# Patient Record
Sex: Female | Born: 1965 | Race: White | Hispanic: No | Marital: Married | Smoking: Former smoker
Health system: Southern US, Community
[De-identification: ages and names within clinical notes are randomized; demographics above are authoritative.]

## PROBLEM LIST (undated history)

## (undated) DIAGNOSIS — E039 Hypothyroidism, unspecified: Secondary | ICD-10-CM

## (undated) DIAGNOSIS — Z9889 Other specified postprocedural states: Secondary | ICD-10-CM

## (undated) DIAGNOSIS — I10 Essential (primary) hypertension: Secondary | ICD-10-CM

## (undated) DIAGNOSIS — R609 Edema, unspecified: Secondary | ICD-10-CM

## (undated) DIAGNOSIS — R112 Nausea with vomiting, unspecified: Secondary | ICD-10-CM

## (undated) DIAGNOSIS — Z973 Presence of spectacles and contact lenses: Secondary | ICD-10-CM

## (undated) DIAGNOSIS — K760 Fatty (change of) liver, not elsewhere classified: Secondary | ICD-10-CM

## (undated) DIAGNOSIS — K219 Gastro-esophageal reflux disease without esophagitis: Secondary | ICD-10-CM

## (undated) DIAGNOSIS — R011 Cardiac murmur, unspecified: Secondary | ICD-10-CM

## (undated) DIAGNOSIS — K651 Peritoneal abscess: Secondary | ICD-10-CM

## (undated) HISTORY — DX: Fatty (change of) liver, not elsewhere classified: K76.0

## (undated) HISTORY — PX: TONSILLECTOMY: SUR1361

## (undated) HISTORY — PX: WISDOM TOOTH EXTRACTION: SHX21

## (undated) HISTORY — PX: APPENDECTOMY: SHX54

## (undated) HISTORY — PX: OTHER SURGICAL HISTORY: SHX169

## (undated) HISTORY — DX: Cardiac murmur, unspecified: R01.1

## (undated) HISTORY — PX: CHOLECYSTECTOMY: SHX55

## (undated) HISTORY — PX: ADENOIDECTOMY: SUR15

## (undated) HISTORY — PX: ABDOMINAL HYSTERECTOMY: SHX81

---

## 1999-06-23 ENCOUNTER — Other Ambulatory Visit: Admission: RE | Admit: 1999-06-23 | Discharge: 1999-06-23 | Payer: Self-pay | Admitting: Obstetrics and Gynecology

## 2000-06-15 ENCOUNTER — Other Ambulatory Visit: Admission: RE | Admit: 2000-06-15 | Discharge: 2000-06-15 | Payer: Self-pay | Admitting: Obstetrics and Gynecology

## 2001-07-17 ENCOUNTER — Other Ambulatory Visit: Admission: RE | Admit: 2001-07-17 | Discharge: 2001-07-17 | Payer: Self-pay | Admitting: Obstetrics and Gynecology

## 2001-07-24 ENCOUNTER — Ambulatory Visit (HOSPITAL_COMMUNITY): Admission: RE | Admit: 2001-07-24 | Discharge: 2001-07-24 | Payer: Self-pay | Admitting: Obstetrics and Gynecology

## 2001-07-24 ENCOUNTER — Encounter: Payer: Self-pay | Admitting: Obstetrics and Gynecology

## 2002-03-30 ENCOUNTER — Encounter: Payer: Self-pay | Admitting: Obstetrics & Gynecology

## 2002-03-30 ENCOUNTER — Inpatient Hospital Stay (HOSPITAL_COMMUNITY): Admission: AD | Admit: 2002-03-30 | Discharge: 2002-03-30 | Payer: Self-pay | Admitting: Obstetrics & Gynecology

## 2002-05-01 ENCOUNTER — Inpatient Hospital Stay (HOSPITAL_COMMUNITY): Admission: AD | Admit: 2002-05-01 | Discharge: 2002-05-01 | Payer: Self-pay | Admitting: Obstetrics & Gynecology

## 2002-05-03 ENCOUNTER — Inpatient Hospital Stay (HOSPITAL_COMMUNITY): Admission: AD | Admit: 2002-05-03 | Discharge: 2002-05-06 | Payer: Self-pay | Admitting: Obstetrics & Gynecology

## 2002-05-03 ENCOUNTER — Encounter (INDEPENDENT_AMBULATORY_CARE_PROVIDER_SITE_OTHER): Payer: Self-pay

## 2002-05-07 ENCOUNTER — Encounter: Admission: RE | Admit: 2002-05-07 | Discharge: 2002-06-06 | Payer: Self-pay | Admitting: Obstetrics & Gynecology

## 2002-06-06 ENCOUNTER — Other Ambulatory Visit: Admission: RE | Admit: 2002-06-06 | Discharge: 2002-06-06 | Payer: Self-pay | Admitting: Obstetrics & Gynecology

## 2002-06-07 ENCOUNTER — Encounter: Admission: RE | Admit: 2002-06-07 | Discharge: 2002-07-07 | Payer: Self-pay | Admitting: Obstetrics & Gynecology

## 2003-06-12 ENCOUNTER — Emergency Department (HOSPITAL_COMMUNITY): Admission: EM | Admit: 2003-06-12 | Discharge: 2003-06-13 | Payer: Self-pay | Admitting: Emergency Medicine

## 2003-07-26 ENCOUNTER — Other Ambulatory Visit: Admission: RE | Admit: 2003-07-26 | Discharge: 2003-07-26 | Payer: Self-pay | Admitting: Obstetrics & Gynecology

## 2004-10-27 ENCOUNTER — Ambulatory Visit: Payer: Self-pay | Admitting: Family Medicine

## 2004-12-10 ENCOUNTER — Ambulatory Visit (HOSPITAL_COMMUNITY): Admission: RE | Admit: 2004-12-10 | Discharge: 2004-12-10 | Payer: Self-pay | Admitting: Surgery

## 2004-12-10 ENCOUNTER — Encounter (INDEPENDENT_AMBULATORY_CARE_PROVIDER_SITE_OTHER): Payer: Self-pay | Admitting: Specialist

## 2004-12-24 ENCOUNTER — Ambulatory Visit: Payer: Self-pay | Admitting: Family Medicine

## 2005-06-14 ENCOUNTER — Ambulatory Visit: Payer: Self-pay | Admitting: Family Medicine

## 2005-11-25 ENCOUNTER — Ambulatory Visit: Payer: Self-pay | Admitting: Family Medicine

## 2005-11-29 DIAGNOSIS — E039 Hypothyroidism, unspecified: Secondary | ICD-10-CM | POA: Insufficient documentation

## 2006-02-21 ENCOUNTER — Ambulatory Visit: Payer: Self-pay | Admitting: Family Medicine

## 2006-02-21 DIAGNOSIS — R1314 Dysphagia, pharyngoesophageal phase: Secondary | ICD-10-CM | POA: Insufficient documentation

## 2006-03-07 ENCOUNTER — Telehealth (INDEPENDENT_AMBULATORY_CARE_PROVIDER_SITE_OTHER): Payer: Self-pay | Admitting: *Deleted

## 2006-03-09 ENCOUNTER — Telehealth: Payer: Self-pay | Admitting: Family Medicine

## 2006-05-30 ENCOUNTER — Ambulatory Visit: Payer: Self-pay | Admitting: Family Medicine

## 2006-06-06 ENCOUNTER — Telehealth: Payer: Self-pay | Admitting: Family Medicine

## 2006-06-08 ENCOUNTER — Ambulatory Visit: Payer: Self-pay | Admitting: Family Medicine

## 2006-07-04 ENCOUNTER — Telehealth: Payer: Self-pay | Admitting: Family Medicine

## 2006-12-13 ENCOUNTER — Encounter: Payer: Self-pay | Admitting: Family Medicine

## 2007-08-28 ENCOUNTER — Ambulatory Visit: Payer: Self-pay | Admitting: Family Medicine

## 2007-08-28 DIAGNOSIS — E785 Hyperlipidemia, unspecified: Secondary | ICD-10-CM | POA: Insufficient documentation

## 2007-08-28 LAB — CONVERTED CEMR LAB
Cholesterol, target level: 200 mg/dL
LDL Goal: 160 mg/dL

## 2007-08-29 ENCOUNTER — Encounter: Admission: RE | Admit: 2007-08-29 | Discharge: 2007-08-29 | Payer: Self-pay | Admitting: Family Medicine

## 2007-08-29 ENCOUNTER — Encounter: Payer: Self-pay | Admitting: Family Medicine

## 2007-08-30 LAB — CONVERTED CEMR LAB
Cholesterol: 187 mg/dL (ref 0–200)
LDL Cholesterol: 100 mg/dL — ABNORMAL HIGH (ref 0–99)
Total CHOL/HDL Ratio: 4
Triglycerides: 200 mg/dL — ABNORMAL HIGH (ref <150)
VLDL: 40 mg/dL (ref 0–40)

## 2007-08-31 ENCOUNTER — Encounter: Admission: RE | Admit: 2007-08-31 | Discharge: 2007-08-31 | Payer: Self-pay | Admitting: Family Medicine

## 2008-01-02 ENCOUNTER — Encounter: Payer: Self-pay | Admitting: Family Medicine

## 2008-01-05 ENCOUNTER — Encounter: Payer: Self-pay | Admitting: Family Medicine

## 2008-01-08 LAB — CONVERTED CEMR LAB
LDL Cholesterol: 115 mg/dL — ABNORMAL HIGH (ref 0–99)
TSH: 4.554 microintl units/mL — ABNORMAL HIGH (ref 0.350–4.50)

## 2008-04-22 ENCOUNTER — Encounter: Payer: Self-pay | Admitting: Family Medicine

## 2008-04-23 LAB — CONVERTED CEMR LAB
Cholesterol: 199 mg/dL (ref 0–200)
HDL: 49 mg/dL (ref >39)
LDL Cholesterol: 119 mg/dL — ABNORMAL HIGH (ref 0–99)
TSH: 8.558 u[IU]/mL — ABNORMAL HIGH (ref 0.350–4.500)
Total CHOL/HDL Ratio: 4.1
Triglycerides: 153 mg/dL — ABNORMAL HIGH (ref <150)
VLDL: 31 mg/dL (ref 0–40)

## 2008-06-06 ENCOUNTER — Ambulatory Visit: Payer: Self-pay | Admitting: Family Medicine

## 2008-07-10 ENCOUNTER — Encounter: Payer: Self-pay | Admitting: Family Medicine

## 2008-09-20 ENCOUNTER — Encounter: Payer: Self-pay | Admitting: Family Medicine

## 2008-12-27 ENCOUNTER — Encounter (INDEPENDENT_AMBULATORY_CARE_PROVIDER_SITE_OTHER): Payer: Self-pay | Admitting: *Deleted

## 2008-12-31 ENCOUNTER — Encounter: Payer: Self-pay | Admitting: Family Medicine

## 2008-12-31 LAB — CONVERTED CEMR LAB: TSH: 6.681 microintl units/mL — ABNORMAL HIGH (ref 0.350–4.500)

## 2009-01-03 ENCOUNTER — Telehealth: Payer: Self-pay | Admitting: Family Medicine

## 2009-02-27 ENCOUNTER — Encounter: Payer: Self-pay | Admitting: Family Medicine

## 2009-02-28 LAB — CONVERTED CEMR LAB: TSH: 4.923 microintl units/mL — ABNORMAL HIGH (ref 0.350–4.500)

## 2009-04-19 ENCOUNTER — Encounter: Payer: Self-pay | Admitting: Family Medicine

## 2009-04-20 LAB — CONVERTED CEMR LAB: TSH: 4.975 microintl units/mL — ABNORMAL HIGH (ref 0.350–4.500)

## 2009-06-13 ENCOUNTER — Encounter: Payer: Self-pay | Admitting: Family Medicine

## 2009-06-16 LAB — CONVERTED CEMR LAB: TSH: 3.547 microintl units/mL (ref 0.350–4.500)

## 2009-08-19 ENCOUNTER — Telehealth (INDEPENDENT_AMBULATORY_CARE_PROVIDER_SITE_OTHER): Payer: Self-pay | Admitting: *Deleted

## 2009-09-09 ENCOUNTER — Ambulatory Visit: Payer: Self-pay | Admitting: Family Medicine

## 2009-09-10 ENCOUNTER — Telehealth: Payer: Self-pay | Admitting: Family Medicine

## 2009-10-17 ENCOUNTER — Encounter: Payer: Self-pay | Admitting: Family Medicine

## 2009-10-20 LAB — CONVERTED CEMR LAB
ALT: 27 units/L (ref 0–35)
AST: 22 units/L (ref 0–37)
Alkaline Phosphatase: 47 units/L (ref 39–117)
Cholesterol: 165 mg/dL (ref 0–200)
Creatinine, Ser: 0.81 mg/dL (ref 0.40–1.20)
LDL Cholesterol: 95 mg/dL (ref 0–99)
Sodium: 139 meq/L (ref 135–145)
Total Bilirubin: 0.5 mg/dL (ref 0.3–1.2)
Total CHOL/HDL Ratio: 3.6
VLDL: 24 mg/dL (ref 0–40)

## 2009-10-21 ENCOUNTER — Encounter: Payer: Self-pay | Admitting: Family Medicine

## 2009-11-13 ENCOUNTER — Ambulatory Visit: Payer: Self-pay | Admitting: Family Medicine

## 2009-12-03 ENCOUNTER — Encounter: Payer: Self-pay | Admitting: Family Medicine

## 2010-01-01 ENCOUNTER — Encounter: Payer: Self-pay | Admitting: Family Medicine

## 2010-02-20 LAB — CONVERTED CEMR LAB: Pap Smear: NORMAL

## 2010-03-09 ENCOUNTER — Encounter: Payer: Self-pay | Admitting: Family Medicine

## 2010-03-15 LAB — CONVERTED CEMR LAB
Cholesterol: 186 mg/dL
LDL Cholesterol: 112 mg/dL

## 2010-03-19 ENCOUNTER — Telehealth: Payer: Self-pay | Admitting: Family Medicine

## 2010-03-19 NOTE — Assessment & Plan Note (Signed)
Summary: Perirectal abscess?   Vital Signs:  Patient profile:   45 year old female Height:      65.25 inches Weight:      186 pounds Temp:     99.5 degrees F Pulse rate:   103 / minute BP sitting:   141 / 78  (right arm) Cuff size:   regular  Vitals Entered By: Avon Gully CMA, Duncan Dull) (November 13, 2009 4:11 PM) CC: pain and pus comming from anus sinc elast friday,fever of 101, pain is now moving to the pelvic area   Primary Care Provider:  Nani Gasser MD  CC:  pain and pus comming from anus sinc elast friday, fever of 101, and pain is now moving to the pelvic area.  History of Present Illness: pain and pus comming from anus sinc elast friday,fever of 101, pain is now moving to the pelvic area.  Pain and fever in the perineum that started Last friday. Went to ED had rectal exam and told had a fissure. Given a fissure and given a cream.  GIven IV fluids  Highest fever of 102 last night. Using Tylenol and Advil for pain and fever.  Thought intially it was fatigue. No sig change in bowels but painful BMs. WBC was 10.7 last Friday in teh ED. No blood in teh stool. No abdominal pain. Pain in pelvis is worse when her fever spikes.   Current Medications (verified): 1)  Levothroid 150 Mcg Tabs (Levothyroxine Sodium) .... Take 1 Tablet By Mouth Once A Day 2)  Childrens Multivitamins  Chew (Pediatric Multiple Vitamins) .... Chew One Tablet By Mouth Once A Day 3)  Fish Oil 1000 Mg Caps (Omega-3 Fatty Acids) .... 2 Tabs By Mouth Two Times A Day  Allergies (verified): 1)  ! Pcn  Comments:  Nurse/Medical Assistant: The patient's medications and allergies were reviewed with the patient and were updated in the Medication and Allergy Lists. Avon Gully CMA, Duncan Dull) (November 13, 2009 4:13 PM)  Past History:  Past Medical History: Last updated: 02/21/2006 G1 P1 Hx of Graves (remission since `03)  Past Surgical History: Last updated: 09/09/2009 Appendectomy  -1986 Cervical  Back fusion - 1994  C/sec - 2004  Cholecystectomy 2008  Tonsillectomy 1984    Family History: Last updated: 09/09/2009 Mother with Lung CA, SLE Father wtih heart dz, 4 vessel bypass, smoker MGM with stroke  Social History: Reviewed history from 09/09/2009 and no changes required. 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  Has MBA/MHA and is married to [employer].  Has 2 yr old son.  Quit smoking 7/03.  No EtOH, drugs.  Uses condoms for birth control.  No caffeine.  + regular exercise.  Physical Exam  General:  Well-developed,well-nourished,in no acute distress; alert,appropriate and cooperative throughout examination Extremities:  Several large external hemorrhoids but they are not inflammed. I feel a firm bulge in teh perineal area in the midline that extends a little more to the left. Tender. No erythema or active drainge of pus.    Impression & Recommendations:  Problem # 1:  ABSCESS, PERIRECTAL (ICD-566) Assessment New Suspect perirectal abscess. Will have surgeon see her in AM. As the main tx is I&D will hold off on ABX. She says the Tyelneol and IBU have been controlling her pain so will use that. Offered somethign stronger but she declined.    Complete Medication List: 1)  Levothroid 150 Mcg Tabs (Levothyroxine sodium) .... Take 1 tablet by mouth once a day 2)  Childrens Multivitamins Chew (  Pediatric multiple vitamins) .... Chew one tablet by mouth once a day 3)  Fish Oil 1000 Mg Caps (Omega-3 fatty acids) .... 2 tabs by mouth two times a day  Patient Instructions: 1)  Downstairs to see the surgeon at 10:30  AM to see Dr.  Michaell Cowing.

## 2010-03-19 NOTE — Progress Notes (Signed)
Summary: clarified synthroid does  Phone Note Call from Patient Call back at Home Phone [phone]   Caller: Patient Call For: Kim Gasser MD Summary of Call: Pt verified that she was taking 137 mg of synthroid. Will throw away the 137 mg. started taking 150 mg of synthroid this am. Will wait 4-6 weeks to get her lab work. Initial call taken by: Avon Gully CMA, Duncan Dull),  September 10, 2009 10:16 AM  Follow-up for Phone Call        Sounds great!  Follow-up by: Kim Gasser MD,  September 10, 2009 10:22 AM

## 2010-03-19 NOTE — Letter (Signed)
Summary: [facility]   Imported By: Lanelle Bal 12/19/2009 11:45:46  _____________________________________________________________________  External Attachment:    Type:   Image     Comment:   External Document

## 2010-03-19 NOTE — Assessment & Plan Note (Signed)
Summary: CPE   Vital Signs:  Patient profile:   45 year old female Height:      65.25 inches Weight:      184 pounds BMI:     30.49 Pulse rate:   78 / minute BP sitting:   126 / 72  (left arm) Cuff size:   regular  Vitals Entered By: Avon Gully CMA, Duncan Dull) (September 09, 2009 11:04 AM) CC: CPE no pap Is Patient Diabetic? No   Primary Care Provider:  Nani Gasser MD  CC:  CPE no pap.  History of Present Illness: CPE no pap.  Feels her weight is up.  Some swelling in her legs. Some itching.  Acne on her back.  Sees Dr. Seymour Bars for her pap and mammogram in [place]. She is uptodate on these.   Current Medications (verified): 1)  Levothroid 150 Mcg Tabs (Levothyroxine Sodium) .... Take 1 Tablet By Mouth Once A Day 2)  Childrens Multivitamins  Chew (Pediatric Multiple Vitamins) .... Chew One Tablet By Mouth Once A Day 3)  Fish Oil 1000 Mg Caps (Omega-3 Fatty Acids) .... 2 Tabs By Mouth Two Times A Day  Allergies (verified): 1)  ! Pcn  Comments:  Nurse/Medical Assistant: The patient's medications and allergies were reviewed with the patient and were updated in the Medication and Allergy Lists. 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  Has MBA/MHA and is married to [employer].  Has 15 yr old son.  Quit smoking 7/03.  No EtOH, drugs.  Uses condoms for birth control.  No caffeine.  + regular exercise.  Physical Exam  General:  Well-developed,well-nourished,in no acute distress; alert,appropriate and cooperative throughout examination Head:  Normocephalic and atraumatic without obvious abnormalities. No apparent alopecia or balding. Eyes:  No  corneal or conjunctival inflammation noted. EOMI. Perrla. Ears:  External ear exam shows no significant lesions or deformities.  Otoscopic examination reveals clear canals, tympanic membranes are intact bilaterally without bulging, retraction, inflammation or discharge. Hearing is grossly normal bilaterally. Nose:  External nasal examination shows no deformity or inflammation. Nasal mucosa are pink and moist without lesions or exudates. Mouth:  Oral mucosa and oropharynx without lesions or exudates.  Teeth in good repair. Neck:  No deformities, masses, or tenderness noted. Chest Wall:  No deformities, masses, or tenderness noted. Lungs:  Normal respiratory effort, chest expands symmetrically. Lungs are clear to auscultation, no crackles or wheezes. Heart:  Normal rate and regular rhythm. S1 and S2 normal without gallop, murmur, click, rub or other extra sounds. no carotid or abdominal bruits.  Abdomen:  Bowel sounds positive,abdomen soft and non-tender without masses, organomegaly or hernias noted.   Msk:  No deformity or scoliosis noted of thoracic or lumbar spine.   Pulses:  R and L carotid,radial,dorsalis pedis and posterior tibial pulses are full and equal bilaterally Extremities:  No clubbing, cyanosis, edema, or deformity noted with normal full range of motion of all joints.   Neurologic:  No cranial nerve deficits noted. Station and gait are normal.DTRs are symmetrical throughout. Sensory, motor and coordinative functions appear intact. Skin:  no rashes.   Cervical Nodes:  No lymphadenopathy noted Psych:  Cognition and  judgment appear intact. Alert and cooperative with normal attention span and concentration. No apparent delusions, illusions, hallucinations   Impression & Recommendations:  Problem # 1:  HEALTH MAINTENANCE EXAM (ICD-V70.0) Her pap and mammo are uptodate. Tdap given today. Encourage to continue exercise Encourage calcium adn vitamin D We will call you with your lab  results.  Orders: T-Comprehensive Metabolic Panel [phone]) T-Lipid Profile ([id number])  Problem # 2:  HYPOTHYROIDISM NOS (ICD-244.9) Pt says she thinks she is tkaing but we have in the computer. Pt to check at home and then will check her hormone level.   Her updated medication list for this problem includes:    Levothroid 150 Mcg Tabs (Levothyroxine sodium) .Marland Kitchen... Take 1 tablet by mouth once a day  Orders: T-TSH ([id number])  Complete Medication List: 1)  Levothroid 150 Mcg Tabs (Levothyroxine sodium) .... Take 1 tablet by mouth once a day 2)  Childrens Multivitamins Chew (Pediatric multiple vitamins) .... Chew one tablet by mouth once a day 3)  Fish Oil 1000 Mg Caps (Omega-3 fatty acids) .... 2 tabs by mouth two times a day  Patient Instructions: 1)  Your Tdap vaccine (tetanus) was given today.  2)  We will call you with your lab results once we receive them. 3)  Take calcium +vitamin D daily. We recommend 800iu of vitamin D daily.  4)  Keep up the regular exercise!!!  PAP Result Date:  02/20/2010 PAP Result:  normal PAP Next Due:  1 yr Last Mammogram:  Done (08/16/2003 4:02:07 PM) Mammogram Result Date:  02/20/2009 Mammogram Result:  normal Mammogram Next Due:  1 yr  Appended Document: CPE   Immunizations Administered:  Tetanus Vaccine:    Vaccine Type: Tdap    Site: left deltoid    Mfr: GlaxoSmithKline    Dose: 0.5 ml    Route: IM    Given by: Sue Lush McCrimmon CMA, (AAMA)    Exp. Date: 07/17/2011    Lot #: [id number]    VIS given: 01/03/07 version given September 10, 2009.

## 2010-03-19 NOTE — Letter (Signed)
Summary: [facility]   Imported By: Lanelle Bal 01/21/2010 15:27:34  _____________________________________________________________________  External Attachment:    Type:   Image     Comment:   External Document

## 2010-03-19 NOTE — Progress Notes (Signed)
Summary: synthroid dose  Phone Note Refill Request Message from:  Fax from Pharmacy on August 19, 2009 3:00 PM  Refills Requested: Medication #1:  Synthroid   Brand Name Necessary? No   Supply Requested: 1 month   Notes: 0.137 MG Tab by mouth 1 x per day - requesting 90 day supply Initial call taken by: Fabienne Bruns,  August 19, 2009 3:01 PM  Follow-up for Phone Call        she is supposed to be on the 150 micrograms dose. Follow-up by: Seymour Bars DO,  August 21, 2009 8:23 PM  Additional Follow-up for Phone Call Additional follow up Details #1::        [facility] informing Pt that she is due for labs and should schedule apt w/ Dr. Judie Petit at the end of this month.  Additional Follow-up by: Payton Spark CMA,  August 22, 2009 11:54 AM

## 2010-03-25 NOTE — Progress Notes (Signed)
Summary: [name] order  Phone Note Call from Patient Call back at Home Phone [phone]   Caller: Patient Summary of Call: pt will be due for repeat TSH at end of month so she can get refills in March.  Please fax orders to lab. Initial call taken by: Francee Piccolo CMA Duncan Dull),  March 19, 2010 11:44 AM  Follow-up for Phone Call        order put in Follow-up by: Avon Gully CMA, Duncan Dull),  March 19, 2010 11:47 AM

## 2010-03-31 ENCOUNTER — Encounter: Payer: Self-pay | Admitting: Family Medicine

## 2010-03-31 ENCOUNTER — Ambulatory Visit (INDEPENDENT_AMBULATORY_CARE_PROVIDER_SITE_OTHER): Payer: 59 | Admitting: Family Medicine

## 2010-03-31 DIAGNOSIS — J069 Acute upper respiratory infection, unspecified: Secondary | ICD-10-CM

## 2010-04-01 LAB — CONVERTED CEMR LAB: TSH: 3.226 microintl units/mL (ref 0.350–4.500)

## 2010-04-08 NOTE — Assessment & Plan Note (Signed)
Summary: Possible Sinus Infection rm 5   Vital Signs:  Patient Profile:   45 Years Old Female CC:      possible sinus infection Height:     65.25 inches Weight:      186.50 pounds O2 Sat:      98 % O2 treatment:    Room Air Temp:     99.2 degrees F oral Pulse rate:   87 / minute Resp:     18 per minute BP sitting:   132 / 78  (left arm) Cuff size:   regular  Vitals Entered By: Clemens Catholic LPN (March 31, 2010 9:50 AM)                  Updated Prior Medication List: LEVOTHROID 150 MCG TABS (LEVOTHYROXINE SODIUM) Take 1 tablet by mouth once a day [BMN] CHILDRENS MULTIVITAMINS  CHEW (PEDIATRIC MULTIPLE VITAMINS) Chew one tablet by mouth once a day FISH OIL 1000 MG CAPS (OMEGA-3 FATTY ACIDS) 2 tabs by mouth two times a day  Current Allergies (reviewed today): ! PCNHistory of Present Illness Chief Complaint: possible sinus infection History of Present Illness:  Subjective: Patient complains of sinus congestion that started 3 days ago without sore throat or cough.  However she has had fatigue and a low grade fever.  She also has a history of seasonal allergies that usually appear in the spring.   No pleuritic pain No wheezing ? post-nasal drainage + sinus pain/pressure No itchy/red eyes No earache No hemoptysis No SOB No nausea No vomiting No abdominal pain No diarrhea No skin rashes + fatigue No myalgias No headache Used OTC meds without relief   REVIEW OF SYSTEMS Constitutional Symptoms      Denies fever, chills, night sweats, weight loss, weight gain, and fatigue.  Eyes       Complains of glasses.      Denies change in vision, eye pain, eye discharge, contact lenses, and eye surgery. Ear/Nose/Throat/Mouth       Complains of sinus problems.      Denies hearing loss/aids, change in hearing, ear pain, ear discharge, dizziness, frequent runny nose, frequent nose bleeds, sore throat, hoarseness, and tooth pain or bleeding.  Respiratory       Denies dry  cough, productive cough, wheezing, shortness of breath, asthma, bronchitis, and emphysema/COPD.  Cardiovascular       Denies murmurs, chest pain, and tires easily with exhertion.    Gastrointestinal       Denies stomach pain, nausea/vomiting, diarrhea, constipation, blood in bowel movements, and indigestion. Genitourniary       Denies painful urination, kidney stones, and loss of urinary control. Neurological       Denies paralysis, seizures, and fainting/blackouts. Musculoskeletal       Denies muscle pain, joint pain, joint stiffness, decreased range of motion, redness, swelling, muscle weakness, and gout.  Skin       Denies bruising, unusual mles/lumps or sores, and hair/skin or nail changes.  Psych       Denies mood changes, temper/anger issues, anxiety/stress, speech problems, depression, and sleep problems. Other Comments: pt c/o sinus pressure/pain and low grade fever x Sunday. she has taken OTC Mucinex DM and head congestion med.    Past History:  Past Medical History: Reviewed history from 02/21/2006 and no changes required. G1 P1 Hx of Graves (remission since `03)  Past Surgical History: Reviewed history from 09/09/2009 and no changes required. Appendectomy -1986 Cervical  Back fusion - 1994  C/sec -  2004  Cholecystectomy 2008  Tonsillectomy 1984    Family History: Reviewed history from 09/09/2009 and no changes required. Mother with Lung CA, SLE Father wtih heart dz, 4 vessel bypass, smoker MGM with stroke  Social History: Reviewed history from 09/09/2009 and no changes required. 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  Has MBA/MHA and is married to [employer].  Has 77 yr old son.  Quit smoking 7/03.  No EtOH, drugs.  Uses condoms for birth control.  No caffeine.  + regular exercise.   Objective:  Appearance:  Patient appears healthy, stated age, and in no acute distress  Eyes:  Pupils are equal, round, and reactive to light and accomdation.   Extraocular movement is intact.  Conjunctivae are not inflamed.  Ears:  Canals normal.  Tympanic membranes normal.   Nose:  Congested turbinates with mild bilateral maxillary sinus tenderness Pharynx:  Normal  Neck:  Supple.   Shotty prominent anterior/posterior nodes are palpated bilaterally.  Lungs:  Clear to auscultation.  Breath sounds are equal.  Heart:  Regular rate and rhythm without murmurs, rubs, or gallops.  Abdomen:  Nontender without masses or hepatosplenomegaly.  Bowel sounds are present.  No CVA or flank tenderness.   Assessment New Problems: UPPER RESPIRATORY INFECTION, ACUTE (ICD-465.9)  SUSPECT AN EARLY VIRAL URI WITH PROMINENT NASAL CONGESTION  Plan New Medications/Changes: AZITHROMYCIN 250 MG TABS (AZITHROMYCIN) Two tabs by mouth on day 1, then 1 tab daily on days 2 through 5 (Rx void after 04/08/10)  #6 tabs x 0, 03/31/2010, Donna Christen MD PREDNISONE 10 MG TABS (PREDNISONE) 2 PO BID for 2 days, then 1 BID for 2 days, then 1 daily for 2 days.  Take PC  #14 x 0, 03/31/2010, Donna Christen MD BENZONATATE 200 MG CAPS (BENZONATATE) One by mouth hs as needed cough  #12 x 0, 03/31/2010, Donna Christen MD  New Orders: New Patient Level III [phone] Planning Comments:   Treat symptomatically for now:  Begin tapering course of prednisone, Increase fluid intake, begin expectorant/decongestant, cough suppressant at bedtime if cough develops.   If fever/chills/sweats persist, or if not improving 5 days begin Z-pack (given Rx to hold).  Followup with PCP if not improving 7 to 10 days.   The patient and/or caregiver has been counseled thoroughly with regard to medications prescribed including dosage, schedule, interactions, rationale for use, and possible side effects and they verbalize understanding.  Diagnoses and expected course of recovery discussed and will return if not improved as expected or if the condition worsens. Patient and/or caregiver verbalized understanding.    Prescriptions: AZITHROMYCIN 250 MG TABS (AZITHROMYCIN) Two tabs by mouth on day 1, then 1 tab daily on days 2 through 5 (Rx void after 04/08/10)  #6 tabs x 0   Entered and Authorized by:   Donna Christen MD   Signed by:   Donna Christen MD on 03/31/2010   Method used:   Print then Give to Patient   RxID:   [id number] PREDNISONE 10 MG TABS (PREDNISONE) 2 PO BID for 2 days, then 1 BID for 2 days, then 1 daily for 2 days.  Take PC  #14 x 0   Entered and Authorized by:   Donna Christen MD   Signed by:   Donna Christen MD on 03/31/2010   Method used:   Print then Give to Patient   RxID:   [id number] BENZONATATE 200 MG CAPS (BENZONATATE) One by mouth hs as needed cough  #12 x 0   Entered  and Authorized by:   Donna Christen MD   Signed by:   Donna Christen MD on 03/31/2010   Method used:   Print then Give to Patient   RxID:   [id number]   Patient Instructions: 1)  Take Mucinex D (guaifenesin with decongestant) twice daily for congestion. 2)  Increase fluid intake, rest. 3)  May use Afrin nasal spray (or generic oxymetazoline) twice daily for about 5 days.  Also recommend using saline nasal spray several times daily and/or saline nasal irrigation. 4)  Begin Azithromycin if not improving about 5 days or if persistent fever develops. 5)  Followup with family doctor if not improving 7 to 10 days.   Orders Added: 1)  New Patient Level III [01027]

## 2010-04-20 ENCOUNTER — Telehealth: Payer: Self-pay | Admitting: Family Medicine

## 2010-04-28 NOTE — Progress Notes (Signed)
Summary: [facility]-Refill Levothroid  Phone Note Refill Request Message from:  Patient  Refills Requested: Medication #1:  LEVOTHROID 150 MCG TABS Take 1 tablet by mouth once a day [BMN]   Dosage confirmed as above?Dosage Confirmed   Supply Requested: 6 months   Last Refilled: 11/14/2009   Notes: Dose verified with pharmacist at [facility].  Method Requested: Electronic Initial call taken by: Francee Piccolo CMA (AAMA),  April 20, 2010 1:12 PM    New/Updated Medications: LEVOTHROID 150 MCG TABS (LEVOTHYROXINE SODIUM) Take 1 tablet by mouth once a day [BMN] Prescriptions: LEVOTHROID 150 MCG TABS (LEVOTHYROXINE SODIUM) Take 1 tablet by mouth once a day Brand medically necessary #30 x 5   Entered by:   Francee Piccolo CMA (AAMA)   Authorized by:   Nani Gasser MD   Signed by:   Francee Piccolo CMA (AAMA) on 04/20/2010   Method used:   Electronically to        [facility] (retail)       [address] [place], [place]  [address]       Ph: [phone]       Fax: [phone]   RxID:   [id number]

## 2010-07-03 NOTE — Op Note (Signed)
Kim Livingston, LUGAR                ACCOUNT NO.:  [id number]   MEDICAL RECORD NO.:  [id number]          PATIENT TYPE:  AMB   LOCATION:  SDS                          FACILITY:  [facility]   PHYSICIAN:  Kim Livingston, M.D.  DATE OF BIRTH:  [birth year]   DATE OF PROCEDURE:  12/10/2004  DATE OF DISCHARGE:                                 OPERATIVE REPORT   PREOPERATIVE DIAGNOSIS:  Chronic cholecystitis with cholelithiasis.   POSTOPERATIVE DIAGNOSIS:  Chronic cholecystitis with cholelithiasis.   PROCEDURE:  Laparoscopic cholecystectomy with intraoperative cholangiogram.   SURGEON:  Kim Livingston, M.D.   FIRST ASSISTANT:  Leonie Livingston, M.D.   ANESTHESIA:  General endotracheal anesthesia with approximately 20 mL of  0.25% Marcaine.   COMPLICATIONS:  None.   INDICATIONS FOR PROCEDURE:  Kim Livingston is a 45 year old white female who has  had a couple of attacks of right-sided abdominal pain.  An ultrasound  obtained revealed multiple gallstones.  She now comes for attempted  laparoscopic cholecystectomy.   The indications and potential complications of this surgery were explained  to this patient.  Potential complications include, but not limited to,  bleeding, infection, bile duct injury and open surgery.   DESCRIPTION OF PROCEDURE:  Patient placed in supine position, given a  general endotracheal anesthetic.  Her abdomen was prepped with Betadine  solution and sterilely draped.  I entered the abdominal cavity through an  infraumbilical incision with sharp dissection carried down to the abdominal  cavity.  A 0 degree 10 mm laparoscope was inserted through a 12 mm Hasson  trocar.  The Hasson trocar was secured with a 0 Vicryl suture, painted with  tincture of benzoin and Steri-Strips.  Three additional trocars were placed.  A 10 mm subxiphoid trocar, a 5 mm right mid subcostal and a 5 mm lateral  subcostal trocar.  First I explored the belly.  Right and left lobes of the  liver were  unremarkable.  Anterior wall of stomach was unremarkable.  The  patient had some adhesions to right lower quadrant from a prior appendectomy  and some midline lower abdominal adhesions from a prior C. section.   The gallbladder was grasped and rotated cephalad.  There were noted to be  adhesions up about half way up the gallbladder wall and thickened  gallbladder wall consistent with chronic cholecystitis.  I dissected these  off, identified the junction of the gallbladder and then placed a clip on  the gallbladder side of the cystic duct.   Then I shot an intraoperative cholangiogram.  The intraoperative  cholangiogram was shot using a cutoff taut catheter and threaded through a  14-gauge Jelco into the side of the cut duct and secured with an Endoclip.  I injected about 7 mL of half strength Hypaque solution into the cystic  duct.  This was done under fluoroscopy.  This showed free flow of contrast  down the cystic duct of the common bile duct into the hepatic radicles and  into the duodenum.  There was no filling defect.  This was felt to be a  normal intraoperative cholangiogram.  I then removed the taut catheter, triply endoclipped the cystic duct and  divided it.  I identified at least one branch of cystic artery into triangle  of Calot and a single clip was placed across this.  She had some other  attachments of her liver to her gallbladder and these were taken down with  sharp dissection and a couple of clips placed there.   The gallbladder was then completely divided from the gallbladder bed.  After  dividing the gallbladder from the bed, I revisualized the triangle of Calot,  I revisualized the gallbladder bed.  I saw no bleeding and no bile leak.  At  this point, I delivered the gallbladder through the umbilicus and sent to  pathology.  I then irrigated the abdomen with about 500 mL of saline,  removed the trocars under direct visualization.   I closed the umbilical port  with 0 Vicryl suture.  I closed the skin with a  5-0 Vicryl suture, painted the wound with tincture of benzoin and Steri-  Strips.   The patient tolerated the procedure well and was transported to the recovery  room in good condition.      Kim Livingston, M.D.  Electronically Signed     DHN/MEDQ  D:  12/10/2004  T:  12/11/2004  Job:  [id number]   cc:   Kim Livingston, M.D.  [phone]  [place], [place] [address]

## 2010-07-03 NOTE — Discharge Summary (Signed)
Kim Livingston, Kim Livingston                          ACCOUNT NO.:  [id number]   MEDICAL RECORD NO.:  [id number]                   PATIENT TYPE:  INP   LOCATION:  9121                                 FACILITY:  [facility]   PHYSICIAN:  Maxie Better, M.D.            DATE OF BIRTH:  1965-04-17   DATE OF ADMISSION:  05/03/2002  DATE OF DISCHARGE:  05/06/2002                                 DISCHARGE SUMMARY   ADMISSION DIAGNOSES:  1. Breech presentation.  2. Pregnancy induced hypertension.  3. Intrauterine gestation at 38+ weeks.   DISCHARGE DIAGNOSES:  1. Complete breech presentation.  2. Pregnancy induced hypertension.  3. Intrauterine gestation at 38+ weeks, now delivered.  4. Postoperative anemia.   PROCEDURE:  Cesarean section.   HISTORY OF PRESENT ILLNESS:  She is a 45 -year-old gravida 1, para 0 female  at 60 and 5/[redacted] weeks gestation by LMP and second trimester ultrasound who was  found to have pregnancy induced hypertension with a breech presentation.  The patient had a blood pressure in the office of 170/88.  She had  complained of headache, no visual disturbances, lower extremity edema which  had been improved with resting.  Her blood type was O positive.  RPR is  nonreactive.  She had declined amniocentesis and ASP pre-testing.  Her  anatomic spittle survey had been normal .   HOSPITAL COURSE:  The patient was admitted PIH labs were preformed.  She had  a reactive nonstress test.  She was taken to the operating room where she  underwent a primary cesarean section with resultant delivery of a live female  from a complete breech presentation.  No intracavitary lesions with noted  Apgar's of 9 and 9.  Normal tubes and ovaries are noted at the time of  surgery.  Weight of the baby was 7 pounds 5 ounces.  The patient had an  uncomplicated postpartum course.  She did not require any antihypertensive  medicines or magnesium sulfate.  By post-op day number three she was deemed  well  enough to be discharged home.  Her blood pressure was 130/80.  Her  incision showed no erythema, induration or exudate.   DISPOSITION:  Home.   CONDITION ON DISCHARGE:  Stable.   DISCHARGE MEDICATIONS:  1. Percocet 5 mg, 1-2 tablets every three to four hours p.r.n. pain, #30.  2. Motrin 800 mg, one p.o. q.6-8h. p.r.n. pain.  3. Prenatal vitamins one p.o. daily.  4. Over-the-counter iron 225 mg one p.o. daily.  5. Colace 100 mg p.o. b.i.d.   FOLLOW UP:  Follow up is at four weeks with Genia Del, M.D.    DISCHARGE INSTRUCTIONS:  1. PIH warning signs were reviewed.  2. Call for temperature greater or equal to 100.4.  3. Nothing to her vagina for four to six weeks.  4. No heavy lifting or bending for two weeks.  5. Follow up with severe abdominal  pain, nausea, vomiting, increase     incisional tearing, redness at the incision site or drainage from the     incision site.     Maxie Better, M.D.                  Maxie Better, M.D.    [place]/[facility]  D:  05/26/2002  T:  05/26/2002  Job:  [id number]

## 2010-07-03 NOTE — Op Note (Signed)
NAME:  Kim Livingston, Kim Livingston                          ACCOUNT NO.:  [id number]   MEDICAL RECORD NO.:  [id number]                   PATIENT TYPE:  INP   LOCATION:  9121                                 FACILITY:  [facility]   PHYSICIAN:  Maxie Better, M.D.            DATE OF BIRTH:  11/17/1965   DATE OF PROCEDURE:  05/03/2002  DATE OF DISCHARGE:                                 OPERATIVE REPORT   PREOPERATIVE DIAGNOSES:  1. Breech presentation.  2. Pregnancy-induced hypertension.  3. Intrauterine gestation at 38+ weeks'.   PROCEDURE:  Primary cesarean section.   POSTOPERATIVE DIAGNOSES:  1. Complete breech presentation.  2. Pregnancy-induced hypertension.  3. Intrauterine gestation at 38+ weeks'.   ANESTHESIA:  Spinal.   SURGEON:  Sheronette A. Cherly Hensen, M.D.   ASSISTANT:  Hayes Ludwig.   INDICATIONS FOR PROCEDURE:  This is a 45 year old gravida 1, para 0, female  at 41 and 6/7 weeks' gestation who has pregnancy-induced hypertension with a  persistent breech presentation who is now being admitted for primary  cesarean section.  Her PIH labs were normal.  Risks and benefits of the  procedure was explained to the patient, consent was signed, the patient was  transferred to the operating room.   PROCEDURE:  Under adequate spinal anesthesia, the patient was placed in a  supine position with a left lateral tilt.  She was sterilely prepped and  draped in the usual fashion.  An indwelling Foley catheter was placed  sterilely.  Marcaine 0.25% 7 mL was injected along the planned incision  line.  A Pfannenstiel skin incision was then made, carried down to the  rectus fascia using Bovie cautery.  The rectus fascia was incised in the  midline, extended bilaterally.  The rectus fascia was bluntly and with  cautery dissected off the rectus muscles in a superior and inferior fashion.  The rectus muscle was split in the midline.  The parietal peritoneum was  entered bluntly and extended as  necessary.  The vesicouterine peritoneum was  developed.  The bladder was bluntly dissected off the lower uterine segment  and displaced inferiorly using a bladder retractor.  A curvilinear low  transverse uterine incision was then made and extended bilaterally using  bandage scissors.  Artificial rupture of membranes occurred at that time.  Clear fluid was noted.  A live female from a complete breech presentation was  delivered in a usual breech maneuver.  The baby was bulb suctioned on the  abdomen.  Cord was clamped and cut.  The baby was transferred to the waiting  pediatricians who assigned Apgars of 9 and 9 at one and five minutes.  The  placenta, which was posterior, was manually removed.  Remnants of the  placental tissue were also removed.  The uterine cavity was cleaned of  debris.  No intracavitary lesions were noted.  Ultimate weight of the baby  was 7 pounds 5 ounces.  The uterine incision had no extension.  The uterine  cavity was closed in two layers.  The first layer was a running lock stitch  of 0 Monocryl.  The second layer was imbricating using 0 Monocryl.  Small  bleeding site on the right angle was hemostased using figure-of-eight 0  Monocryl suture.  Normal tubes and ovaries were noted bilaterally.  The  abdomen was copiously irrigated, suctioned of debris.  The paracolic gutters  were cleaned of debris.  With good hemostasis noted inferiorly on the  uterus, the decision was made to close.  The parietal peritoneum and the  vesicouterine peritoneum were now closed.  The under surface of the rectus  fascia and the rectus muscle was inspected.  Small bleeders cauterized.  The  rectus fascia was closed with 0 Vicryl x2.  The subcutaneous area was  irrigated, suctioned of debris.  The skin was approximated using Ethicon  staples.  Specimen was placenta sent to pathology.  Estimated blood loss was  850 cc.  Intraoperative fluid was 2400 cc crystalloid.  Urine output was 150   cc clear yellow urine.  Sponge and instrument counts x2 was correct.  Complications were none.  The patient tolerated the procedure well, was  transferred to the recovery room in stable condition.                                               Maxie Better, M.D.    [place]/MED[facility]  D:  05/03/2002  T:  05/03/2002  Job:  [id number]

## 2010-08-07 ENCOUNTER — Telehealth: Payer: Self-pay | Admitting: Family Medicine

## 2010-08-07 DIAGNOSIS — E039 Hypothyroidism, unspecified: Secondary | ICD-10-CM

## 2010-08-07 NOTE — Telephone Encounter (Signed)
Lab printed WE can fax downstairs. Can we find out what exactly she is taking so if it comes back abnormal I have a close idea of what we might need to change it to.

## 2010-08-07 NOTE — Telephone Encounter (Signed)
Pt requesting to do Tsh level for??? Underactive status.  If levels are abnorm she is requesting to go back on synthroid rather that what she has been on. Jarvis Newcomer, LPN [facility]

## 2010-08-11 NOTE — Telephone Encounter (Signed)
LMOM for pt to call detailed information regarding specific medication on for the thyroid.  Pending pt callback. Kim Newcomer, LPN [facility]

## 2010-08-13 ENCOUNTER — Telehealth: Payer: Self-pay | Admitting: Family Medicine

## 2010-08-13 MED ORDER — LEVOTHYROXINE SODIUM 175 MCG PO TABS: 175.0000 ug | ORAL_TABLET | Freq: Every day | ORAL | Status: DC

## 2010-08-13 NOTE — Telephone Encounter (Signed)
Pt said she is on levothroid .15.  In the old system it looks like she was on 150 mcg.  Routed to Dr. Linford Arnold

## 2010-08-13 NOTE — Telephone Encounter (Signed)
LMOM for pt with detailed instructions that dose had changed for her thyroid medication.  Need a pharmacy for her so we can send the new script to the pharmacy.  Told to call the office with this information. Jarvis Newcomer, LPN [facility]

## 2010-08-13 NOTE — Telephone Encounter (Signed)
I changed Denise's thyroid medication to levothyroxine 175 mcg daily.  Be sure she is taking this on empty stomach and apart from all other meds.  Have her set up OV in 8 wks and will repeat TSH then.  Need pharmacy put into EMR.

## 2010-08-14 NOTE — Telephone Encounter (Signed)
Pt had returned call and she wanted to get trade name of her synthoid.  Said somehow it has been getting filled as generic and she has noted that her levels are needing to be adjusted with the thyroid since this change occurred. Plan:  Told pt dosing had changed and to have the pharmacist call if she picked up script and it was generic. Jarvis Newcomer, LPN [facility]

## 2010-08-24 ENCOUNTER — Other Ambulatory Visit: Payer: Self-pay | Admitting: Family Medicine

## 2010-08-24 NOTE — Telephone Encounter (Signed)
Pt called and stated her synthroid was increase to 175 mcg daily and she went to [facility] prior to leaving for vacation and they did not have her script.  Pt says she only takes the trade and not generic as well.   Plan:  Reviewed pt chart and the script was entered electronically on 08-13-10 but no pharm was loaded into the pt chart so script didn't go anywhere.  Called the [facility] pharm in [place] and gave a verbal just in case the script doesn't send over to them. LMOM for the patient indicating the problem and that it had been fixed. Jarvis Newcomer, LPN [facility]

## 2010-09-12 ENCOUNTER — Encounter: Payer: Self-pay | Admitting: Family Medicine

## 2010-09-13 ENCOUNTER — Inpatient Hospital Stay (INDEPENDENT_AMBULATORY_CARE_PROVIDER_SITE_OTHER)
Admission: RE | Admit: 2010-09-13 | Discharge: 2010-09-13 | Disposition: A | Discharge status: Home / Self Care | Payer: Managed Care, Other (non HMO) | Source: Ambulatory Visit | Attending: Emergency Medicine | Admitting: Emergency Medicine

## 2010-09-13 ENCOUNTER — Encounter: Payer: Self-pay | Admitting: Emergency Medicine

## 2010-09-13 DIAGNOSIS — J018 Other acute sinusitis: Secondary | ICD-10-CM

## 2010-10-02 ENCOUNTER — Encounter: Payer: Self-pay | Admitting: Family Medicine

## 2010-10-02 ENCOUNTER — Ambulatory Visit (INDEPENDENT_AMBULATORY_CARE_PROVIDER_SITE_OTHER): Payer: Managed Care, Other (non HMO) | Admitting: Family Medicine

## 2010-10-02 VITALS — BP 124/76 | HR 78 | Ht 65.25 in | Wt 187.0 lb

## 2010-10-02 DIAGNOSIS — Z0000 Encounter for general adult medical examination without abnormal findings: Secondary | ICD-10-CM

## 2010-10-02 DIAGNOSIS — K122 Cellulitis and abscess of mouth: Secondary | ICD-10-CM

## 2010-10-02 DIAGNOSIS — E039 Hypothyroidism, unspecified: Secondary | ICD-10-CM

## 2010-10-02 DIAGNOSIS — R21 Rash and other nonspecific skin eruption: Secondary | ICD-10-CM

## 2010-10-02 MED ORDER — CLINDAMYCIN HCL 300 MG PO CAPS: 300.0000 mg | ORAL_CAPSULE | Freq: Three times a day (TID) | ORAL | Status: AC

## 2010-10-02 NOTE — Patient Instructions (Signed)
Start a regular exercise program and make sure you are eating a healthy diet Try to eat 4 servings of dairy a day or take a calcium supplement (500mg twice a day). Your vaccines are up to date.   

## 2010-10-02 NOTE — Progress Notes (Signed)
  Subjective:     Kim Livingston is a 45 y.o. female and is here for a comprehensive physical exam. The patient reports problems - Rash on feet and legs that started earlier this week while at the beach. Slightly itchy.  No useing any creams on it. .Also has a knot on the roof of her mouth that are really tender started a few days ago. Had a regular dental Check up in July.  No problems at that time. No worsening or alleviating sxs.    History   Social History  . Marital Status: Married    Spouse Name: N/A    Number of Children: N/A  . Years of Education: N/A   Occupational History  . Not on file.   Social History Main Topics  . Smoking status: Former Smoker    Quit date: 08/15/2001  . Smokeless tobacco: Not on file  . Alcohol Use: No  . Drug Use: No  . Sexually Active:    Other Topics Concern  . Not on file   Social History Narrative  . No narrative on file   Health Maintenance  Topic Date Due  . Pap Smear  02/19/1985  . Tetanus/tdap  09/11/2019    The following portions of the patient's history were reviewed and updated as appropriate: allergies, current medications, past family history, past medical history, past social history and past surgical history.  Review of Systems A comprehensive review of systems was negative.    Objective:    BP 124/76  Pulse 78  Ht 5' 5.25" (1.657 m)  Wt 187 lb (84.823 kg)  BMI 30.88 kg/m2   Gen: NAD, over weight, well nourished Head: Normocephalic Mouth: OP is clear, Teetth in fair condition.  She has a firm nodule that is erythenaout on the left soft palate.  ENT: EOMi, PEERL, conjunctiva are clear Heart: RRR no murmur, No carotid or abdoiminal bruits Lungs; CTA-B Abd: soft, NT, normal BS LE: No LE Breast and pelvic not performed as sees OB Neuro: reflexes 1+ in UE and LE:  Skin: fine papula and macular rash on th etops of her feet. No sign of infection. Lesion fade with compession    Assessment:    Healthy female exam.        Plan:     See After Visit Summary for Counseling Recommendations   Start a regular exercise program and make sure you are eating a healthy diet Try to eat 4 servings of dairy a day or take a calcium supplement (500mg  twice a day). Your vaccines are up to date.  Given flu vaccine today.  Rash on feet - likely from sun exposure or sun screen allergy. Can use OTC topical cortisone cream. Call if not resolved in 10 days Lesion on roof of mouth suspicious for abscess. She has PCNa llergy so will tx with clinda. I rec she f/u with her dentist ASAP to make sure this is not coming from a tooth.  Pt says she will call.

## 2010-10-02 NOTE — Assessment & Plan Note (Signed)
Due to recheck thyroid. Doing well on her synthroid nad feeling well.

## 2010-10-04 ENCOUNTER — Telehealth: Payer: Self-pay | Admitting: Family Medicine

## 2010-10-04 MED ORDER — LEVOTHYROXINE SODIUM 150 MCG PO TABS: 150.0000 ug | ORAL_TABLET | Freq: Every day | ORAL | Status: DC

## 2010-10-04 NOTE — Telephone Encounter (Signed)
We need to decrease her dose adn recheck in 2 months.

## 2010-10-06 NOTE — Telephone Encounter (Signed)
Left message on vm

## 2010-10-12 ENCOUNTER — Other Ambulatory Visit: Payer: Self-pay | Admitting: Family Medicine

## 2010-10-12 NOTE — Telephone Encounter (Signed)
Pt called and said that she was seen two weeks ago, and went to the pharm to [facility] a new dose for thyroid medication, but the [facility] in [place] did not have that script. PLAN:  Reviewed the pt chart, and the levothyroxine 150 mcg daily was sent electronically for #30/1 refill on 10-04-10.    Gave a verbal order to the [facility]/[place].  Pt informed.

## 2010-11-08 ENCOUNTER — Encounter: Payer: Self-pay | Admitting: Family Medicine

## 2010-11-08 ENCOUNTER — Inpatient Hospital Stay (INDEPENDENT_AMBULATORY_CARE_PROVIDER_SITE_OTHER)
Admission: RE | Admit: 2010-11-08 | Discharge: 2010-11-08 | Disposition: A | Discharge status: Home / Self Care | Payer: Managed Care, Other (non HMO) | Source: Ambulatory Visit | Attending: Family Medicine | Admitting: Family Medicine

## 2010-11-08 DIAGNOSIS — J310 Chronic rhinitis: Secondary | ICD-10-CM

## 2010-11-30 ENCOUNTER — Telehealth: Payer: Self-pay | Admitting: *Deleted

## 2010-11-30 DIAGNOSIS — E039 Hypothyroidism, unspecified: Secondary | ICD-10-CM

## 2010-11-30 NOTE — Telephone Encounter (Signed)
Lab ordered.

## 2010-12-02 LAB — TSH: TSH: 1.318 u[IU]/mL (ref 0.350–4.500)

## 2010-12-03 ENCOUNTER — Telehealth: Payer: Self-pay | Admitting: Family Medicine

## 2010-12-03 NOTE — Telephone Encounter (Signed)
Quick Note:  Pls notify pt that her TSH was in good/normal range. Continue 150 mcg synthroid daily.--PM ______

## 2010-12-03 NOTE — Telephone Encounter (Signed)
Message copied by Gifford Shave on Thu Dec 03, 2010  5:02 PM ------      Message from: [place], [place] H      Created: Thu Dec 03, 2010  2:42 PM       Pls notify pt that her TSH was in good/normal range.  Continue 150 mcg synthroid daily.--PM

## 2010-12-03 NOTE — Telephone Encounter (Signed)
Pt informed by [facility] that the TSh level in normal range, and to continue same med dose. Jarvis Newcomer, LPN [facility]

## 2010-12-22 ENCOUNTER — Other Ambulatory Visit: Payer: Self-pay | Admitting: *Deleted

## 2010-12-22 MED ORDER — SYNTHROID 150 MCG PO TABS: 150.0000 ug | ORAL_TABLET | Freq: Every day | ORAL | Status: DC

## 2011-01-18 NOTE — Progress Notes (Signed)
Summary: sinus/TM(rm5)   Vital Signs:  Patient Profile:   45 Years Old Female CC:      allergies Height:     65.25 inches Weight:      187.50 pounds O2 Sat:      99 % O2 treatment:    Room Air Temp:     98.3 degrees F oral Pulse rate:   62 / minute Resp:     20 per minute BP sitting:   132 / 91  (left arm) Cuff size:   regular  Vitals Entered By: Linton Flemings RN (November 08, 2010 3:37 PM)                  Updated Prior Medication List: LEVOTHROID 150 MCG TABS (LEVOTHYROXINE SODIUM) Take 1 tablet by mouth once a day [BMN] CHILDRENS MULTIVITAMINS  CHEW (PEDIATRIC MULTIPLE VITAMINS) Chew one tablet by mouth once a day FISH OIL 1000 MG CAPS (OMEGA-3 FATTY ACIDS) 2 tabs by mouth two times a day  Current Allergies: ! PCNHistory of Present Illness Chief Complaint: allergies History of Present Illness:  Subjective: Patient complains of seasonal allergies that have persisted for about a month, not responding to Allegra and Claritin.  Gruaifenesin helps somewhat. No sore throat No cough No pleuritic pain No wheezing + nasal congestion ? post-nasal drainage No sinus pain/pressure No itchy/red eyes No earache No hemoptysis No SOB No fever/chills No nausea No vomiting No abdominal pain No diarrhea No skin rashes No fatigue No myalgias No headache    REVIEW OF SYSTEMS Constitutional Symptoms      Denies fever, chills, night sweats, weight loss, weight gain, and fatigue.  Eyes       Denies change in vision, eye pain, eye discharge, glasses, contact lenses, and eye surgery. Ear/Nose/Throat/Mouth       Complains of frequent runny nose and sinus problems.      Denies hearing loss/aids, change in hearing, ear pain, ear discharge, dizziness, frequent nose bleeds, sore throat, hoarseness, and tooth pain or bleeding.  Respiratory       Denies dry cough, productive cough, wheezing, shortness of breath, asthma, bronchitis, and emphysema/COPD.  Cardiovascular  Denies murmurs, chest pain, and tires easily with exhertion.    Gastrointestinal       Denies stomach pain, nausea/vomiting, diarrhea, constipation, blood in bowel movements, and indigestion. Genitourniary       Denies painful urination, kidney stones, and loss of urinary control. Neurological       Denies paralysis, seizures, and fainting/blackouts. Musculoskeletal       Denies muscle pain, joint pain, joint stiffness, decreased range of motion, redness, swelling, muscle weakness, and gout.  Skin       Denies bruising, unusual mles/lumps or sores, and hair/skin or nail changes.  Psych       Denies mood changes, temper/anger issues, anxiety/stress, speech problems, depression, and sleep problems. Other Comments: allergies x 4 weeks   Past History:  Past Medical History: Reviewed history from 02/21/2006 and no changes required. G1 P1 Hx of Graves (remission since `03)  Past Surgical History: Reviewed history from 09/09/2009 and no changes required. Appendectomy -1986 Cervical  Back fusion - 1994  C/sec - 2004  Cholecystectomy 2008  Tonsillectomy 1984    Family History: Reviewed history from 09/09/2009 and no changes required. Mother with Lung CA, SLE Father wtih heart dz, 4 vessel bypass, smoker MGM with stroke  Social History: Reviewed history from 09/09/2009 and no changes required. 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  Has MBA/MHA and is married to [employer].  Has 70 yr old son.  Quit smoking 7/03.  No EtOH, drugs.  Uses condoms for birth control.  No caffeine.  + regular exercise.   Objective:  Appearance:  Patient appears healthy, stated age, and in no acute distress  Eyes:  Pupils are equal, round, and reactive to light and accomodation.  Extraocular movement is intact.  Conjunctivae are not inflamed.  Ears:  Canals normal.  Tympanic membranes normal.  Nose:  Congested turbinates.  No sinus tenderness  Pharynx:  Normal  Neck:  Supple.  No  adenopathy is present.   Lungs:  Clear to auscultation.  Breath sounds are equal.  Heart:  Regular rate and rhythm without murmurs, rubs, or gallops.  Assessment New Problems: RHINITIS (ICD-472.0)  NO EVIDENCE BACTERIAL INFECTION TODAY  Plan New Medications/Changes: NASONEX 50 MCG/ACT SUSP (MOMETASONE FUROATE) Two sprays in each nostril once a day  #17gm x 1, 11/08/2010, Donna Christen MD PREDNISONE 10 MG TABS (PREDNISONE) 2 PO today, then 2 BID for 2 days, then 1 two times a day for 2 days, then 1 daily for 2 days.  Take PC  #16 x 0, 11/08/2010, Donna Christen MD  New Orders: Services provided After hours-Weekends-Holidays [99051] Est. Patient Level III [99213] Planning Comments:   Begin tapering course of prednisone.  Begin Nasonex nasal spray.  Use Afrin nasal spray for about 5 days.  Use saline nasal irrigation.  Continue expectorant/decongestant (Mucinex D) Follow-up with ENT if not improving.   The patient and/or caregiver has been counseled thoroughly with regard to medications prescribed including dosage, schedule, interactions, rationale for use, and possible side effects and they verbalize understanding.  Diagnoses and expected course of recovery discussed and will return if not improved as expected or if the condition worsens. Patient and/or caregiver verbalized understanding.  Prescriptions: NASONEX 50 MCG/ACT SUSP (MOMETASONE FUROATE) Two sprays in each nostril once a day  #17gm x 1   Entered and Authorized by:   Donna Christen MD   Signed by:   Donna Christen MD on 11/08/2010   Method used:   Print then Give to Patient   RxID:   [id number] PREDNISONE 10 MG TABS (PREDNISONE) 2 PO today, then 2 BID for 2 days, then 1 two times a day for 2 days, then 1 daily for 2 days.  Take PC  #16 x 0   Entered and Authorized by:   Donna Christen MD   Signed by:   Donna Christen MD on 11/08/2010   Method used:   Print then Give to Patient   RxID:   [id number]   Patient  Instructions: 1)  Take Mucinex D (guaifenesin with decongestant) twice daily for congestion. 2)  Increase fluid intake. 3)  May use Afrin nasal spray (or generic oxymetazoline) once or twice daily for about 5 days.  Use the Afrin about 15 minutes before using Nasonex spray.  Also recommend using saline nasal spray several times daily and/or saline nasal irrigation. 4)  Followup with ENT doctor if not improving   Orders Added: 1)  Services provided After hours-Weekends-Holidays [99051] 2)  Est. Patient Level III [72536]

## 2011-01-18 NOTE — Progress Notes (Signed)
Summary: sinuitis/TM   Vital Signs:  Patient Profile:   45 Years Old Female CC:      SINUS INFECTION O2 treatment:    Room Air (left arm) Cuff size:   regular  Vitals Entered By: Linton Flemings RN (September 13, 2010 1:53 PM)                  Updated Prior Medication List: LEVOTHROID 150 MCG TABS (LEVOTHYROXINE SODIUM) Take 1 tablet by mouth once a day [BMN] CHILDRENS MULTIVITAMINS  CHEW (PEDIATRIC MULTIPLE VITAMINS) Chew one tablet by mouth once a day FISH OIL 1000 MG CAPS (OMEGA-3 FATTY ACIDS) 2 tabs by mouth two times a day BENZONATATE 200 MG CAPS (BENZONATATE) One by mouth hs as needed cough PREDNISONE 10 MG TABS (PREDNISONE) 2 PO BID for 2 days, then 1 BID for 2 days, then 1 daily for 2 days.  Take PC AZITHROMYCIN 250 MG TABS (AZITHROMYCIN) Two tabs by mouth on day 1, then 1 tab daily on days 2 through 5 (Rx void after 04/08/10)  Current Allergies (reviewed today): ! PCNHistory of Present Illness History from: patient Chief Complaint: SINUS INFECTION History of Present Illness: 45 Years Old Female complains of onset of cold symptoms for 7 days.  Kim Livingston has been using Mucinex & Claritin which is helping a little bit.  Started as facial pressure, resolved. Then worsening yesterday again with a 101 fever. No sore throat No cough No pleuritic pain No wheezing No nasal congestion No post-nasal drainage + sinus pain/pressure No chest congestion No itchy/red eyes + earache No hemoptysis No SOB No chills/sweats + fever No nausea No vomiting No abdominal pain No diarrhea No skin rashes No fatigue No myalgias + headache   REVIEW OF SYSTEMS Constitutional Symptoms      Denies fever, chills, night sweats, weight loss, weight gain, and fatigue.  Eyes       Denies change in vision, eye pain, eye discharge, glasses, contact lenses, and eye surgery. Ear/Nose/Throat/Mouth       Denies hearing loss/aids, change in hearing, ear pain, ear discharge, dizziness, frequent runny  nose, frequent nose bleeds, sinus problems, sore throat, hoarseness, and tooth pain or bleeding.  Respiratory       Denies dry cough, productive cough, wheezing, shortness of breath, asthma, bronchitis, and emphysema/COPD.  Cardiovascular       Denies murmurs, chest pain, and tires easily with exhertion.    Gastrointestinal       Denies stomach pain, nausea/vomiting, diarrhea, constipation, blood in bowel movements, and indigestion. Genitourniary       Denies painful urination, kidney stones, and loss of urinary control. Neurological       Denies paralysis, seizures, and fainting/blackouts. Musculoskeletal       Denies muscle pain, joint pain, joint stiffness, decreased range of motion, redness, swelling, muscle weakness, and gout.  Skin       Denies bruising, unusual mles/lumps or sores, and hair/skin or nail changes.  Psych       Denies mood changes, temper/anger issues, anxiety/stress, speech problems, depression, and sleep problems.  Past History:  Past Medical History: Reviewed history from 02/21/2006 and no changes required. G1 P1 Hx of Graves (remission since `03)  Past Surgical History: Reviewed history from 09/09/2009 and no changes required. Appendectomy -1986 Cervical  Back fusion - 1994  C/sec - 2004  Cholecystectomy 2008  Tonsillectomy 1984    Family History: Reviewed history from 09/09/2009 and no changes required. Mother with Lung CA, SLE Father wtih heart  dz, 4 vessel bypass, smoker MGM with stroke  Social History: Reviewed history from 09/09/2009 and no changes required. 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  Has MBA/MHA and is married to [employer].  Has 21 yr old son.  Quit smoking 7/03.  No EtOH, drugs.  Uses condoms for birth control.  No caffeine.  + regular exercise. Physical Exam General appearance: well developed, well nourished, no acute distress Ears: R TM mildly erythematous Nasal: mucosa pink, nonedematous, no septal deviation,  turbinates normal Oral/Pharynx: tongue normal, posterior pharynx without erythema or exudate Chest/Lungs: no rales, wheezes, or rhonchi bilateral, breath sounds equal without effort Heart: regular rate and  rhythm, no murmur MSE: oriented to time, place, and person Assessment New Problems: OTHER ACUTE SINUSITIS (ICD-461.8)   Plan New Medications/Changes: ZITHROMAX Z-PAK 250 MG TABS (AZITHROMYCIN) use as directed  #1 x 0, 09/13/2010, Hoyt Koch MD  New Orders: Est. Patient Level III [phone] Planning Comments:   1)  Take the prescribed antibiotic as instructed. 2)  Use nasal saline solution (over the counter) at least 3 times a day. 3)  Use over the counter decongestants like Zyrtec-D every 12 hours as needed to help with congestion. 4)  Can take tylenol every 6 hours or motrin every 8 hours for pain or fever. 5)  Follow up with your primary doctor  if no improvement in 5-7 days, sooner if increasing pain, fever, or new symptoms.    The patient and/or caregiver has been counseled thoroughly with regard to medications prescribed including dosage, schedule, interactions, rationale for use, and possible side effects and they verbalize understanding.  Diagnoses and expected course of recovery discussed and will return if not improved as expected or if the condition worsens. Patient and/or caregiver verbalized understanding.  Prescriptions: ZITHROMAX Z-PAK 250 MG TABS (AZITHROMYCIN) use as directed  #1 x 0   Entered and Authorized by:   Hoyt Koch MD   Signed by:   Hoyt Koch MD on 09/13/2010   Method used:   Print then Give to Patient   RxID:   [id number]   Orders Added: 1)  Est. Patient Level III [66440]

## 2011-02-10 ENCOUNTER — Telehealth: Payer: Self-pay | Admitting: *Deleted

## 2011-02-10 DIAGNOSIS — E039 Hypothyroidism, unspecified: Secondary | ICD-10-CM

## 2011-02-10 NOTE — Telephone Encounter (Signed)
Pt notified. Order faxed to lab. MA, LPN

## 2011-02-10 NOTE — Telephone Encounter (Signed)
Pt called and states she is not feeling well. She would like to go to lab and have her thyroid levels checked. Will you approve this request for me to send to lab?  MA, LPN

## 2011-02-10 NOTE — Telephone Encounter (Signed)
OK for TSH 

## 2011-03-11 ENCOUNTER — Other Ambulatory Visit: Payer: Self-pay | Admitting: Obstetrics & Gynecology

## 2011-03-11 DIAGNOSIS — R1084 Generalized abdominal pain: Secondary | ICD-10-CM

## 2011-03-11 DIAGNOSIS — N926 Irregular menstruation, unspecified: Secondary | ICD-10-CM

## 2011-03-16 ENCOUNTER — Other Ambulatory Visit: Payer: Managed Care, Other (non HMO)

## 2011-03-18 ENCOUNTER — Ambulatory Visit
Admission: RE | Admit: 2011-03-18 | Discharge: 2011-03-18 | Disposition: A | Discharge status: Home / Self Care | Payer: Managed Care, Other (non HMO) | Source: Ambulatory Visit | Attending: Obstetrics & Gynecology | Admitting: Obstetrics & Gynecology

## 2011-03-18 ENCOUNTER — Other Ambulatory Visit: Payer: Managed Care, Other (non HMO)

## 2011-03-18 DIAGNOSIS — N926 Irregular menstruation, unspecified: Secondary | ICD-10-CM

## 2011-03-18 DIAGNOSIS — R1084 Generalized abdominal pain: Secondary | ICD-10-CM

## 2011-03-18 MED ORDER — GADOBENATE DIMEGLUMINE 529 MG/ML IV SOLN
20.0000 mL | Freq: Once | INTRAVENOUS | Status: AC | PRN
  Administered 2011-03-18: 20 mL via INTRAVENOUS

## 2011-03-23 ENCOUNTER — Encounter (HOSPITAL_COMMUNITY): Payer: Self-pay | Admitting: Pharmacist

## 2011-03-26 ENCOUNTER — Encounter (HOSPITAL_COMMUNITY)
Admission: RE | Admit: 2011-03-26 | Discharge: 2011-03-26 | Disposition: A | Discharge status: Home / Self Care | Payer: Managed Care, Other (non HMO) | Source: Ambulatory Visit | Attending: Obstetrics & Gynecology | Admitting: Obstetrics & Gynecology

## 2011-03-26 ENCOUNTER — Encounter (HOSPITAL_COMMUNITY): Payer: Self-pay

## 2011-03-26 ENCOUNTER — Other Ambulatory Visit: Payer: Self-pay | Admitting: Obstetrics & Gynecology

## 2011-03-26 HISTORY — DX: Nausea with vomiting, unspecified: Z98.890

## 2011-03-26 HISTORY — DX: Hypothyroidism, unspecified: E03.9

## 2011-03-26 HISTORY — DX: Other specified postprocedural states: R11.2

## 2011-03-26 LAB — CBC
HCT: 41.5 % (ref 36.0–46.0)
Hemoglobin: 13.7 g/dL (ref 12.0–15.0)
MCH: 30.4 pg (ref 26.0–34.0)
RBC: 4.5 MIL/uL (ref 3.87–5.11)

## 2011-03-26 LAB — SURGICAL PCR SCREEN
MRSA, PCR: NEGATIVE
Staphylococcus aureus: NEGATIVE

## 2011-03-26 NOTE — Patient Instructions (Signed)
   Your procedure is scheduled on: Tuesday, Feb. 12  Enter through the [employer] of [facility] at: 6:00am Pick up the phone at the desk and dial [phone] and inform us of your arrival.  Please call this number if you have any problems the morning of surgery: [phone]  Remember: Do not eat food after midnight: Monday Do not drink clear liquids after: Monday  Do not wear jewelry, make-up, or FINGER nail polish Do not wear lotions, powders, perfumes or deodorant. Do not shave 48 hours prior to surgery. Do not bring valuables to the hospital.  Leave suitcase in the car. After Surgery it may be brought to your room. For patients being admitted to the hospital, checkout time is 11:00am the day of discharge.  Patients discharged on the day of surgery will not be allowed to drive home.    Remember to use your hibiclens as instructed.Please shower with 1/2 bottle the evening before your surgery and the other 1/2 bottle the morning of surgery.

## 2011-03-30 ENCOUNTER — Ambulatory Visit (HOSPITAL_COMMUNITY)
Admission: RE | Admit: 2011-03-30 | Discharge: 2011-03-30 | Disposition: A | Discharge status: Home / Self Care | Payer: Managed Care, Other (non HMO) | Source: Ambulatory Visit | Attending: Obstetrics & Gynecology | Admitting: Obstetrics & Gynecology

## 2011-03-30 ENCOUNTER — Encounter (HOSPITAL_COMMUNITY): Payer: Self-pay | Admitting: Anesthesiology

## 2011-03-30 ENCOUNTER — Encounter (HOSPITAL_COMMUNITY): Payer: Self-pay | Admitting: *Deleted

## 2011-03-30 ENCOUNTER — Ambulatory Visit (HOSPITAL_COMMUNITY): Payer: Managed Care, Other (non HMO) | Admitting: Anesthesiology

## 2011-03-30 ENCOUNTER — Other Ambulatory Visit: Payer: Self-pay | Admitting: Obstetrics & Gynecology

## 2011-03-30 ENCOUNTER — Encounter (HOSPITAL_COMMUNITY)
Admission: RE | Disposition: A | Discharge status: Home / Self Care | Payer: Self-pay | Source: Ambulatory Visit | Attending: Obstetrics & Gynecology

## 2011-03-30 DIAGNOSIS — Z01818 Encounter for other preprocedural examination: Secondary | ICD-10-CM | POA: Insufficient documentation

## 2011-03-30 DIAGNOSIS — N920 Excessive and frequent menstruation with regular cycle: Secondary | ICD-10-CM | POA: Insufficient documentation

## 2011-03-30 DIAGNOSIS — D259 Leiomyoma of uterus, unspecified: Secondary | ICD-10-CM | POA: Insufficient documentation

## 2011-03-30 DIAGNOSIS — Z01812 Encounter for preprocedural laboratory examination: Secondary | ICD-10-CM | POA: Insufficient documentation

## 2011-03-30 DIAGNOSIS — N949 Unspecified condition associated with female genital organs and menstrual cycle: Secondary | ICD-10-CM | POA: Insufficient documentation

## 2011-03-30 LAB — PREGNANCY, URINE: Preg Test, Ur: NEGATIVE

## 2011-03-30 LAB — ABO/RH: ABO/RH(D): O POS

## 2011-03-30 LAB — CBC
HCT: 34.3 % — ABNORMAL LOW (ref 36.0–46.0)
Hemoglobin: 11.4 g/dL — ABNORMAL LOW (ref 12.0–15.0)
MCH: 30.6 pg (ref 26.0–34.0)
MCHC: 33.2 g/dL (ref 30.0–36.0)
MCV: 92.2 fL (ref 78.0–100.0)
RDW: 13.5 % (ref 11.5–15.5)

## 2011-03-30 SURGERY — ROBOTIC ASSISTED SUPRACERVICAL HYSTERECTOMY
Anesthesia: General | Wound class: Clean Contaminated

## 2011-03-30 MED ORDER — ROCURONIUM BROMIDE 100 MG/10ML IV SOLN
INTRAVENOUS | Status: DC | PRN
  Administered 2011-03-30 (×2): 10 mg via INTRAVENOUS
  Administered 2011-03-30: 45 mg via INTRAVENOUS
  Administered 2011-03-30 (×2): 10 mg via INTRAVENOUS
  Administered 2011-03-30: 5 mg via INTRAVENOUS
  Administered 2011-03-30: 10 mg via INTRAVENOUS

## 2011-03-30 MED ORDER — ROCURONIUM BROMIDE 50 MG/5ML IV SOLN
INTRAVENOUS | Status: AC
Start: 2011-03-30 — End: 2011-03-30
  Filled 2011-03-30: qty 1

## 2011-03-30 MED ORDER — ONDANSETRON HCL 4 MG/2ML IJ SOLN
INTRAMUSCULAR | Status: AC
  Filled 2011-03-30: qty 2

## 2011-03-30 MED ORDER — OXYCODONE-ACETAMINOPHEN 7.5-325 MG PO TABS: 1.0000 | ORAL_TABLET | ORAL | Status: AC | PRN

## 2011-03-30 MED ORDER — STERILE WATER FOR IRRIGATION IR SOLN
Status: DC | PRN
  Administered 2011-03-30: 1000 mL

## 2011-03-30 MED ORDER — GLYCOPYRROLATE 0.2 MG/ML IJ SOLN
INTRAMUSCULAR | Status: DC | PRN
  Administered 2011-03-30: 0.3 mg via INTRAVENOUS
  Administered 2011-03-30: .6 mg via INTRAVENOUS

## 2011-03-30 MED ORDER — PROMETHAZINE HCL 25 MG/ML IJ SOLN: 6.2500 mg | INTRAMUSCULAR | Status: DC | PRN

## 2011-03-30 MED ORDER — PROPOFOL 10 MG/ML IV EMUL
INTRAVENOUS | Status: DC | PRN
  Administered 2011-03-30: 130 mg via INTRAVENOUS
  Administered 2011-03-30: 20 mg via INTRAVENOUS

## 2011-03-30 MED ORDER — METRONIDAZOLE IN NACL 5-0.79 MG/ML-% IV SOLN
INTRAVENOUS | Status: DC | PRN
  Administered 2011-03-30: 500 mg via INTRAVENOUS

## 2011-03-30 MED ORDER — FENTANYL CITRATE 0.05 MG/ML IJ SOLN
INTRAMUSCULAR | Status: AC
  Filled 2011-03-30: qty 5

## 2011-03-30 MED ORDER — METRONIDAZOLE IN NACL 5-0.79 MG/ML-% IV SOLN
500.0000 mg | INTRAVENOUS | Status: DC
  Filled 2011-03-30: qty 100

## 2011-03-30 MED ORDER — NEOSTIGMINE METHYLSULFATE 1 MG/ML IJ SOLN
INTRAMUSCULAR | Status: DC | PRN
  Administered 2011-03-30: 3 mg via INTRAVENOUS

## 2011-03-30 MED ORDER — MEPERIDINE HCL 25 MG/ML IJ SOLN: 6.2500 mg | INTRAMUSCULAR | Status: DC | PRN

## 2011-03-30 MED ORDER — BUPIVACAINE HCL (PF) 0.25 % IJ SOLN
INTRAMUSCULAR | Status: DC | PRN
  Administered 2011-03-30: 10 mL

## 2011-03-30 MED ORDER — DEXAMETHASONE SODIUM PHOSPHATE 10 MG/ML IJ SOLN
INTRAMUSCULAR | Status: AC
  Filled 2011-03-30: qty 1

## 2011-03-30 MED ORDER — BUPIVACAINE HCL (PF) 0.25 % IJ SOLN
INTRAMUSCULAR | Status: AC
  Filled 2011-03-30: qty 30

## 2011-03-30 MED ORDER — KETOROLAC TROMETHAMINE 30 MG/ML IJ SOLN: 15.0000 mg | Freq: Once | INTRAMUSCULAR | Status: DC | PRN

## 2011-03-30 MED ORDER — HYDROMORPHONE HCL PF 1 MG/ML IJ SOLN
INTRAMUSCULAR | Status: DC | PRN
  Administered 2011-03-30 (×2): 1 mg via INTRAVENOUS

## 2011-03-30 MED ORDER — GENTAMICIN IN SALINE 1-0.9 MG/ML-% IV SOLN
100.0000 mg | INTRAVENOUS | Status: DC
  Filled 2011-03-30: qty 100

## 2011-03-30 MED ORDER — FENTANYL CITRATE 0.05 MG/ML IJ SOLN
INTRAMUSCULAR | Status: DC | PRN
  Administered 2011-03-30: 50 ug via INTRAVENOUS
  Administered 2011-03-30: 150 ug via INTRAVENOUS
  Administered 2011-03-30: 50 ug via INTRAVENOUS
  Administered 2011-03-30: 100 ug via INTRAVENOUS

## 2011-03-30 MED ORDER — PROPOFOL 10 MG/ML IV EMUL
INTRAVENOUS | Status: AC
  Filled 2011-03-30: qty 20

## 2011-03-30 MED ORDER — DEXAMETHASONE SODIUM PHOSPHATE 4 MG/ML IJ SOLN
INTRAMUSCULAR | Status: DC | PRN
  Administered 2011-03-30: 10 mg via INTRAVENOUS

## 2011-03-30 MED ORDER — SCOPOLAMINE 1 MG/3DAYS TD PT72
MEDICATED_PATCH | TRANSDERMAL | Status: AC
  Administered 2011-03-30: 1.5 mg via TRANSDERMAL
  Filled 2011-03-30: qty 1

## 2011-03-30 MED ORDER — ONDANSETRON HCL 4 MG/2ML IJ SOLN
INTRAMUSCULAR | Status: DC | PRN
  Administered 2011-03-30: 4 mg via INTRAVENOUS

## 2011-03-30 MED ORDER — OXYCODONE-ACETAMINOPHEN 5-325 MG PO TABS
1.0000 | ORAL_TABLET | ORAL | Status: DC | PRN
  Administered 2011-03-30: 1 via ORAL
  Filled 2011-03-30: qty 1

## 2011-03-30 MED ORDER — HYDROMORPHONE HCL PF 1 MG/ML IJ SOLN
INTRAMUSCULAR | Status: AC
  Filled 2011-03-30: qty 1

## 2011-03-30 MED ORDER — MIDAZOLAM HCL 2 MG/2ML IJ SOLN
INTRAMUSCULAR | Status: AC
  Filled 2011-03-30: qty 2

## 2011-03-30 MED ORDER — GENTAMICIN SULFATE 40 MG/ML IJ SOLN
INTRAVENOUS | Status: DC | PRN
  Administered 2011-03-30: 100 mL via INTRAVENOUS

## 2011-03-30 MED ORDER — FENTANYL CITRATE 0.05 MG/ML IJ SOLN: 25.0000 ug | INTRAMUSCULAR | Status: DC | PRN

## 2011-03-30 MED ORDER — ROCURONIUM BROMIDE 50 MG/5ML IV SOLN
INTRAVENOUS | Status: AC
  Filled 2011-03-30: qty 2

## 2011-03-30 MED ORDER — LACTATED RINGERS IV SOLN: INTRAVENOUS | Status: DC

## 2011-03-30 MED ORDER — LACTATED RINGERS IR SOLN
Status: DC | PRN
  Administered 2011-03-30: 3000 mL

## 2011-03-30 MED ORDER — HYDROMORPHONE HCL PF 1 MG/ML IJ SOLN: 1.0000 mg | INTRAMUSCULAR | Status: DC | PRN

## 2011-03-30 MED ORDER — MIDAZOLAM HCL 5 MG/5ML IJ SOLN
INTRAMUSCULAR | Status: DC | PRN
  Administered 2011-03-30: 2 mg via INTRAVENOUS

## 2011-03-30 MED ORDER — LACTATED RINGERS IV SOLN
INTRAVENOUS | Status: DC
  Administered 2011-03-30: 50 mL/h via INTRAVENOUS
  Administered 2011-03-30: 12:00:00 via INTRAVENOUS

## 2011-03-30 MED ORDER — GLYCOPYRROLATE 0.2 MG/ML IJ SOLN
INTRAMUSCULAR | Status: AC
  Filled 2011-03-30: qty 1

## 2011-03-30 MED ORDER — LIDOCAINE HCL (CARDIAC) 20 MG/ML IV SOLN
INTRAVENOUS | Status: AC
  Filled 2011-03-30: qty 5

## 2011-03-30 MED ORDER — FENTANYL CITRATE 0.05 MG/ML IJ SOLN
INTRAMUSCULAR | Status: AC
  Filled 2011-03-30: qty 2

## 2011-03-30 MED ORDER — SCOPOLAMINE 1 MG/3DAYS TD PT72
1.0000 | MEDICATED_PATCH | Freq: Once | TRANSDERMAL | Status: DC
  Administered 2011-03-30: 1.5 mg via TRANSDERMAL

## 2011-03-30 MED ORDER — LIDOCAINE HCL (CARDIAC) 20 MG/ML IV SOLN
INTRAVENOUS | Status: DC | PRN
  Administered 2011-03-30: 60 mg via INTRAVENOUS

## 2011-03-30 MED ORDER — ACETAMINOPHEN 325 MG PO TABS: 650.0000 mg | ORAL_TABLET | ORAL | Status: DC | PRN

## 2011-03-30 SURGICAL SUPPLY — 62 items
ADH SKN CLS APL DERMABOND .7 (GAUZE/BANDAGES/DRESSINGS) ×2
BAG URINE DRAINAGE (UROLOGICAL SUPPLIES) ×1 IMPLANT
BARRIER ADHS 3X4 INTERCEED (GAUZE/BANDAGES/DRESSINGS) ×2 IMPLANT
BLADE LAP MORCELLATOR 15X9.5 (ELECTROSURGICAL) ×1 IMPLANT
BRR ADH 4X3 ABS CNTRL BYND (GAUZE/BANDAGES/DRESSINGS) ×1
CATH FOLEY LF 3WAY 5CC16FR (CATHETERS) ×1 IMPLANT
CHLORAPREP W/TINT 26ML (MISCELLANEOUS) ×1 IMPLANT
CLOTH BEACON ORANGE TIMEOUT ST (SAFETY) ×2 IMPLANT
COVER TABLE BACK 60X90 (DRAPES) ×2 IMPLANT
COVER TIP SHEARS 8 DVNC (MISCELLANEOUS) IMPLANT
COVER TIP SHEARS 8MM DA VINCI (MISCELLANEOUS) ×2
DECANTER SPIKE VIAL GLASS SM (MISCELLANEOUS) ×2 IMPLANT
DERMABOND ADVANCED (GAUZE/BANDAGES/DRESSINGS) ×2
DERMABOND ADVANCED .7 DNX12 (GAUZE/BANDAGES/DRESSINGS) ×2 IMPLANT
DRAPE HUG U DISPOSABLE (DRAPE) ×2 IMPLANT
DRAPE LG THREE QUARTER DISP (DRAPES) ×4 IMPLANT
DRAPE MONITOR DA VINCI (DRAPE) IMPLANT
DRAPE WARM FLUID 44X44 (DRAPE) ×2 IMPLANT
ELECT REM PT RETURN 9FT ADLT (ELECTROSURGICAL) ×2
ELECTRODE REM PT RTRN 9FT ADLT (ELECTROSURGICAL) ×1 IMPLANT
EVACUATOR SMOKE 8.L (FILTER) ×2 IMPLANT
GAUZE VASELINE 3X9 (GAUZE/BANDAGES/DRESSINGS) IMPLANT
GLOVE BIO SURGEON STRL SZ 6.5 (GLOVE) ×4 IMPLANT
GLOVE BIO SURGEON STRL SZ7.5 (GLOVE) ×6 IMPLANT
GLOVE ECLIPSE 6.5 STRL STRAW (GLOVE) ×6 IMPLANT
GOWN STRL REIN XL XLG (GOWN DISPOSABLE) ×12 IMPLANT
IV SET ADMIN PUMP GEMINI W/NLD (IV SETS) IMPLANT
IV STOPCOCK 4 WAY 40  W/Y SET (IV SOLUTION) ×1
IV STOPCOCK 4 WAY 40 W/Y SET (IV SOLUTION) ×1 IMPLANT
KIT ACCESSORY DA VINCI DISP (KITS) ×1
KIT ACCESSORY DVNC DISP (KITS) ×1 IMPLANT
KIT DISP ACCESSORY 4 ARM (KITS) IMPLANT
NEEDLE HYPO 22GX1.5 SAFETY (NEEDLE) ×2 IMPLANT
OCCLUDER COLPOPNEUMO (BALLOONS) IMPLANT
PACK LAVH (CUSTOM PROCEDURE TRAY) ×2 IMPLANT
PLUG CATH AND CAP STER (CATHETERS) ×2 IMPLANT
POSITIONER SURGICAL ARM (MISCELLANEOUS) ×4 IMPLANT
PROTECTOR NERVE ULNAR (MISCELLANEOUS) ×4 IMPLANT
SET CYSTO W/LG BORE CLAMP LF (SET/KITS/TRAYS/PACK) ×1 IMPLANT
SET IRRIG TUBING LAPAROSCOPIC (IRRIGATION / IRRIGATOR) ×2 IMPLANT
SOLUTION ELECTROLUBE (MISCELLANEOUS) ×2 IMPLANT
SPONGE LAP 18X18 X RAY DECT (DISPOSABLE) IMPLANT
SUT VIC AB 0 CT1 27 (SUTURE)
SUT VIC AB 0 CT1 27XBRD ANTBC (SUTURE) IMPLANT
SUT VIC AB 4-0 PS2 27 (SUTURE) ×2 IMPLANT
SUT VICRYL 0 UR6 27IN ABS (SUTURE) ×4 IMPLANT
SYR 50ML LL SCALE MARK (SYRINGE) ×2 IMPLANT
SYSTEM CONVERTIBLE TROCAR (TROCAR) ×2 IMPLANT
TIP UTERINE 5.1X6CM LAV DISP (MISCELLANEOUS) IMPLANT
TIP UTERINE 6.7X10CM GRN DISP (MISCELLANEOUS) ×1 IMPLANT
TIP UTERINE 6.7X6CM WHT DISP (MISCELLANEOUS) IMPLANT
TIP UTERINE 6.7X8CM BLUE DISP (MISCELLANEOUS) IMPLANT
TOWEL OR 17X24 6PK STRL BLUE (TOWEL DISPOSABLE) ×4 IMPLANT
TROCAR 12M 150ML BLUNT (TROCAR) IMPLANT
TROCAR DISP BLADELESS 8 DVNC (TROCAR) ×1 IMPLANT
TROCAR DISP BLADELESS 8MM (TROCAR) ×1
TROCAR XCEL 12X100 BLDLESS (ENDOMECHANICALS) ×2 IMPLANT
TROCAR XCEL NON-BLD 5MMX100MML (ENDOMECHANICALS) ×2 IMPLANT
TROCAR Z-THREAD BLADED 12X100M (TROCAR) IMPLANT
TUBING FILTER THERMOFLATOR (ELECTROSURGICAL) ×2 IMPLANT
WARMER LAPAROSCOPE (MISCELLANEOUS) ×1 IMPLANT
WATER STERILE IRR 1000ML POUR (IV SOLUTION) ×6 IMPLANT

## 2011-03-30 NOTE — Op Note (Signed)
03/30/2011  11:58 AM  PATIENT:  Kim Livingston  46 y.o. female  PRE-OPERATIVE DIAGNOSIS:  Large Myoma; Menometrorrhagia, Pelvic Pain  POST-OPERATIVE DIAGNOSIS:  Large Myoma; Menometrorrhagia, Pelvic Pain, Omental and bowel adhesions  PROCEDURE:  Procedure(s): ROBOTIC ASSISTED SUPRACERVICAL HYSTERECTOMY, ROBOTIC ASSISTED LYSIS OF ADHESIONS  SURGEON:  Surgeon(s): Genia Del, MD Lenoard Aden, MD  ASSISTANTS: Dr Lenoard Aden   ANESTHESIA:   general  PROCEDURE:  Under general anesthesia the patient is in lithotomy position. She is prepped with ChloraPrep on the abdomen and Betadine on the suprapubic vulvar and vaginal areas. The uterus is nodular and enlarged with fibroids.  The weighted speculum is inserted in the vagina and the anterior lip of the cervix is grasped with a tenaculum. A #10 roomy with the small co ring are put in place without difficulty.  The weighted speculum is removed. The patient is draped as usual. The Foley is put in place in the bladder.  The supraumbilical incision is made with a scalpel over 1.5 cm.  The subcutaneous tissue was infiltrated with Marcaine were quarter plain. The aponeurosis is opened with Mayo scissors and the peritoneum as well. A pursestring stitch of Vicryl 0 is done on the aponeurosis. The [place] with the camera are inserted at that level. A pneumoperitoneum with CO2 was created. Inspection of the abdominopelvic cavity reveals adhesions anteriorly and to the right with the omentum and the bowels.  The uterus is about 18 cm in diameter. Both ovaries are normal to inspection. Pictures are taken. We placed the ports in a semicircular configuration. 2 robotic ports on the right side, 1 robot robotic port on the upper left side and the assistant port on the lower left side.  All sites are infiltrated with Marcaine one quarter plain and an incision is made with a scalpel.  All ports are inserted under direct vision. The robot his docked from the  right side without difficulty.  We used a fenestrated Progrfaspin the third arm, the Endo Shears scissors and the right hand and the PK in the left hand.  The largest fibroid that was measured at 13.7 cm by ultrasound and MRI is more to the right side and goes very low close to the cervix. We cauterized and section the right utero-ovarian ligament then the right tube.  We then went to the left side cauterized and sectioned the left utero-ovarian ligament the left tube and the left arm round ligament. We were then able to go back to the right and cauterized and section the right round ligament. We then had to change the third arm to a tenaculum in order to all this the large myoma and access the right side of the uterus remove it from the wall.  We then opened the anterior of the visceral peritoneum and the lower the bladder. The bladder was filled with saline to ascertain its exact location. We were able to access the left uterine artery first on the left side it was cauterized but not cut.  We then, help with the 30 angle down camera, access the right uterine artery and cauterized it.  We then sectioned just above the uterosacral ligaments starting anteriorly with the tip of the Endo Shears scissors.  The uterus was completely freed with a fibroid, leaving the cervix in place. We then removed the roomy and a core ring to cauterize the inside of the cervix with the PK.  Hemostasis was verified and is adequate at all levels. We therefore removed  all instruments. We undocked the robot. And went by laparoscopy. The morcellator was inserted in the supraumbilical incision. And the 8 mm camera was used in the upper right robotic trocar.  Morcellation was done easily and specimen was sent to pathology. We then irrigated and suctioned the abdominopelvic cavities abundantly. Hemostasis was confirmed once more. We used Interceed him over the cervix. All instruments were then removed and the trochars as well.  The CO2 was  evacuated. The supraumbilical incision was closed by attaching the pursestring stitch the aponeurosis. We then closed all incisions with a subcuticular stitch of Vicryl 4-0 and Dermabond was added. The patient was brought to recovery room in good stable status.  ESTIMATED BLOOD LOSS:  200 cc   Intake/Output Summary (Last 24 hours) at 03/30/11 1158 Last data filed at 03/30/11 1100  Gross per 24 hour  Intake    500 ml  Output    400 ml  Net    100 ml     BLOOD ADMINISTERED:none   LOCAL MEDICATIONS USED:  MARCAINE     SPECIMEN:  Source of Specimen:  Uterus with myomas (no cervix)  DISPOSITION OF SPECIMEN:  PATHOLOGY  COUNTS:  YES  PLAN OF CARE: Transfer to PACU  Genia Del MD 03/30/11 at 12:00

## 2011-03-30 NOTE — Anesthesia Procedure Notes (Signed)
Procedure Name: Intubation Date/Time: 03/30/2011 7:40 AM Performed by: Isabella Bowens Pre-anesthesia Checklist: Patient identified, Emergency Drugs available, Suction available, Patient being monitored and Timeout performed Patient Re-evaluated:Patient Re-evaluated prior to inductionOxygen Delivery Method: Circle System Utilized Preoxygenation: Pre-oxygenation with 100% oxygen Intubation Type: IV induction Ventilation: Mask ventilation without difficulty Laryngoscope Size: Mac and 3 Grade View: Grade I Tube type: Oral Tube size: 7.0 mm Number of attempts: 1 Airway Equipment and Method: stylet Placement Confirmation: positive ETCO2,  CO2 detector and breath sounds checked- equal and bilateral Secured at: 21 cm Tube secured with: Tape Dental Injury: Teeth and Oropharynx as per pre-operative assessment  Difficulty Due To: Difficulty was unanticipated

## 2011-03-30 NOTE — Discharge Instructions (Signed)
POST-OPERATIVE INSTRUCTIONS TO PATIENT  Call [facility]  for excessive pain, bleeding or temperature greater than or equal to 100.4 degrees (orally).    No driving for 1 week No lifting (more than 20 lbs) for 3 weeks No sexual activity for 6 weeks  Pain management: Use Ibuprofen 600 mg every 6 hours for 5 days and then as needed.           Use your pain medication as needed to maintain a pain level at or            below 3/10             Use Colace 1-2 capsules per day as long as you are using pain            medication to avoid constipation.       Diet: normal  Bathing: may shower day after surgery  Wound Care: keep incisions clean and dry  Return to Dr. Seymour Bars in 3 weeks Return to work: To be determined at post operative visit.    Ermina Oberman,MARIE-LYNE MD 03/30/2011 12:22 PM

## 2011-03-30 NOTE — Addendum Note (Signed)
Addendum  created 03/30/11 1605 by Truitt Leep, CRNA   Modules edited:Notes Section

## 2011-03-30 NOTE — Anesthesia Preprocedure Evaluation (Signed)
Anesthesia Evaluation  Patient identified by MRN, date of birth, ID band Patient awake    Reviewed: Allergy & Precautions, H&P , NPO status , Patient's Chart, lab work & pertinent test results  History of Anesthesia Complications (+) PONV  Airway Mallampati: I TM Distance: >3 FB Neck ROM: full    Dental No notable dental hx. (+) Teeth Intact   Pulmonary neg pulmonary ROS,    Pulmonary exam normal       Cardiovascular neg cardio ROS     Neuro/Psych Negative Neurological ROS  Negative Psych ROS   GI/Hepatic negative GI ROS, Neg liver ROS,   Endo/Other  Hypothyroidism   Renal/GU negative Renal ROS  Genitourinary negative   Musculoskeletal negative musculoskeletal ROS (+)   Abdominal Normal abdominal exam  (+)   Peds negative pediatric ROS (+)  Hematology negative hematology ROS (+)   Anesthesia Other Findings   Reproductive/Obstetrics negative OB ROS                           Anesthesia Physical Anesthesia Plan  ASA: II  Anesthesia Plan: General   Post-op Pain Management:    Induction: Intravenous  Airway Management Planned: Oral ETT  Additional Equipment:   Intra-op Plan:   Post-operative Plan: Extubation in OR  Informed Consent: I have reviewed the patients History and Physical, chart, labs and discussed the procedure including the risks, benefits and alternatives for the proposed anesthesia with the patient or authorized representative who has indicated his/her understanding and acceptance.   Dental Advisory Given  Plan Discussed with: CRNA  Anesthesia Plan Comments:         Anesthesia Quick Evaluation

## 2011-03-30 NOTE — Anesthesia Postprocedure Evaluation (Signed)
Anesthesia Post Note  Patient: Kim Livingston  Procedure(s) Performed: Procedure(s) (LRB): ROBOTIC ASSISTED SUPRACERVICAL HYSTERECTOMY (N/A) ROBOTIC ASSISTED LAPAROSCOPIC LYSIS OF ADHESION (N/A)  Anesthesia type: General  Patient location: PACU  Post pain: Pain level controlled  Post assessment: Post-op Vital signs reviewed  Last Vitals:  Filed Vitals:   03/30/11 1201  BP: 106/43  Pulse: 100  Temp: 37 C  Resp:     Post vital signs: Reviewed  Level of consciousness: sedated  Complications: No apparent anesthesia complicationsfj

## 2011-03-30 NOTE — Anesthesia Postprocedure Evaluation (Signed)
  Anesthesia Post-op Note  Patient: Kim Livingston  Procedure(s) Performed: Procedure(s) (LRB): ROBOTIC ASSISTED SUPRACERVICAL HYSTERECTOMY (N/A) ROBOTIC ASSISTED LAPAROSCOPIC LYSIS OF ADHESION (N/A)  Patient Location: PACU and Women's Unit  Anesthesia Type: General  Level of Consciousness: awake, alert  and oriented  Airway and Oxygen Therapy: Patient Spontanous Breathing  Post-op Pain: mild  Post-op Assessment: Patient's Cardiovascular Status Stable and Respiratory Function Stable  Post-op Vital Signs: stable  Complications: No apparent anesthesia complications

## 2011-03-30 NOTE — H&P (Signed)
Kim Livingston is an 46 y.o. female G1P1 C/S  RP:  Subtotal hysterectomy assisted with Engineer, building services, morcellation for symptomatic uterine myomas  Pertinent Gynecological History: Menses: Heavy menses with dysmeno Contraception: condoms Blood transfusions: none Sexually transmitted diseases: no past history Previous GYN Procedures: none  Last mammogram: normal  Last pap: normal     Menstrual History:  Patient's last menstrual period was 03/07/2011.    Past Medical History  Diagnosis Date  . Hypothyroidism   . PONV (postoperative nausea and vomiting)     Past Surgical History  Procedure Date  . Appendectomy   . Cervical back fusion     Upper back.   . Cesarean section   . Cholecystectomy   . Tonsillectomy     Family History  Problem Relation Age of Onset  . Cancer Mother     lung  . Lupus Mother   . Heart disease Father     4 vessel bypass, smoker  . Stroke Maternal Grandmother   . Lung cancer Maternal Grandmother   . Bone cancer Paternal Grandfather     Social History:  reports that she quit smoking about 9 years ago. She does not have any smokeless tobacco history on file. She reports that she does not drink alcohol or use illicit drugs.  Allergies:  Allergies  Allergen Reactions  . Penicillins     REACTION: hives    Prescriptions prior to admission  Medication Sig Dispense Refill  . calcium carbonate (TUMS - DOSED IN MG ELEMENTAL CALCIUM) 500 MG chewable tablet Chew 1 tablet by mouth at bedtime.      . Omega-3 Fatty Acids 1200 MG CAPS Take 2,400 mg by mouth 2 (two) times daily.      . Pediatric Multiple Vit-C-FA (PEDIATRIC MULTIVITAMIN) chewable tablet Chew 1 tablet by mouth daily.      Marland Kitchen SYNTHROID 150 MCG tablet Take 1 tablet (150 mcg total) by mouth daily. Brand name synthroid only  30 tablet  6     Blood pressure 118/62, pulse 72, temperature 98.1 F (36.7 C), temperature source Oral, resp. rate 16, last menstrual period 03/07/2011, SpO2 98.00%.  US  uterine volume 511 cc with pelvic mass 13.7 cm MRI  Uterine myoma with necrosis  Results for orders placed during the hospital encounter of 03/30/11 (from the past 24 hour(s))  PREGNANCY, URINE     Status: Normal   Collection Time   03/30/11  6:30 AM      Component Value Range   Preg Test, Ur NEGATIVE  NEGATIVE     No results found.  Assessment/Plan: Pelvic pain and menorrhagia with uterine myomas for subtotal hysterectomy assisted with da Vinci, morcellation.  Surgery and risks reviewed.  Kim Livingston,Kim Livingston 03/30/2011, 6:48 AM

## 2011-03-30 NOTE — Transfer of Care (Signed)
Immediate Anesthesia Transfer of Care Note  Patient: Kim Livingston  Procedure(s) Performed: Procedure(s) (LRB): ROBOTIC ASSISTED SUPRACERVICAL HYSTERECTOMY (N/A) ROBOTIC ASSISTED LAPAROSCOPIC LYSIS OF ADHESION (N/A)  Patient Location: PACU  Anesthesia Type: General  Level of Consciousness: awake and sedated  Airway & Oxygen Therapy: Patient Spontanous Breathing and Patient connected to nasal cannula oxygen  Post-op Assessment: Report given to PACU RN and Post -op Vital signs reviewed and stable  Post vital signs: Reviewed and stable  Complications: No apparent anesthesia complications

## 2011-05-08 ENCOUNTER — Emergency Department
Admission: EM | Admit: 2011-05-08 | Discharge: 2011-05-08 | Disposition: A | Discharge status: Home / Self Care | Payer: Managed Care, Other (non HMO) | Source: Home / Self Care | Attending: Emergency Medicine | Admitting: Emergency Medicine

## 2011-05-08 DIAGNOSIS — J069 Acute upper respiratory infection, unspecified: Secondary | ICD-10-CM

## 2011-05-08 DIAGNOSIS — J329 Chronic sinusitis, unspecified: Secondary | ICD-10-CM

## 2011-05-08 HISTORY — DX: Peritoneal abscess: K65.1

## 2011-05-08 MED ORDER — AZITHROMYCIN 250 MG PO TABS: ORAL_TABLET | ORAL | Status: AC

## 2011-05-08 NOTE — ED Notes (Signed)
Pt states she she had URI symptoms x 11 weeks ago at the same time of kidney infection was treated with Cipro and states she got better now having cough and nasal congestion

## 2011-05-08 NOTE — ED Provider Notes (Signed)
History     CSN: [id number]  Arrival date & time 05/08/11  1607   First MD Initiated Contact with Patient 05/08/11 1633      Chief Complaint  Patient presents with  . Sinusitis    (Consider location/radiation/quality/duration/timing/severity/associated sxs/prior treatment) HPI Kim Livingston is a 46 y.o. female who complains of onset of cold symptoms for 4 days.  She is already taking Mucinex D and generic allergy medicine which is helping a little bit.  She had similar symptoms about a month ago while simultaneously having a urinary infection and was treated with her Cipro and got better after a week.  Her medical history is positive for a robotic-assisted hysterectomy that was done about 6 weeks ago. No sore throat + cough No pleuritic pain No wheezing + sneezing + nasal congestion + post-nasal drainage + sinus pain/pressure bilateral cheeks No chest congestion No itchy/red eyes No earache No hemoptysis No SOB No chills/sweats No fever No nausea No vomiting No abdominal pain No diarrhea No skin rashes No fatigue No myalgias No headache    Past Medical History  Diagnosis Date  . Hypothyroidism   . PONV (postoperative nausea and vomiting)   . Abscess of peritoneum     Past Surgical History  Procedure Date  . Appendectomy   . Cervical back fusion     Upper back.   . Cesarean section   . Cholecystectomy   . Tonsillectomy     Family History  Problem Relation Age of Onset  . Cancer Mother     lung  . Lupus Mother   . Heart disease Father     4 vessel bypass, smoker  . Stroke Maternal Grandmother   . Lung cancer Maternal Grandmother   . Bone cancer Paternal Grandfather     History  Substance Use Topics  . Smoking status: Former Smoker    Quit date: 08/15/2001  . Smokeless tobacco: Not on file  . Alcohol Use: No    OB History    Grav Para Term Preterm Abortions TAB SAB Ect Mult Living                  Review of Systems  All other systems reviewed  and are negative.    Allergies  Penicillins  Home Medications   Current Outpatient Rx  Name Route Sig Dispense Refill  . AZITHROMYCIN 250 MG PO TABS  Use as directed 1 each 0  . CALCIUM CARBONATE ANTACID 500 MG PO CHEW Oral Chew 1 tablet by mouth at bedtime.    . OMEGA-3 FATTY ACIDS 1200 MG PO CAPS Oral Take 2,400 mg by mouth 2 (two) times daily.    Marland Kitchen CHILDRENS CHEWABLE MULTI VITS PO CHEW Oral Chew 1 tablet by mouth daily.    Marland Kitchen SYNTHROID 150 MCG PO TABS Oral Take 1 tablet (150 mcg total) by mouth daily. Brand name synthroid only 30 tablet 6    Dispense as written.    LMP 03/07/2011  Physical Exam  Nursing note and vitals reviewed. Constitutional: She is oriented to person, place, and time. She appears well-developed and well-nourished.  HENT:  Head: Normocephalic and atraumatic.  Right Ear: Tympanic membrane, external ear and ear canal normal.  Left Ear: Tympanic membrane, external ear and ear canal normal.  Nose: Mucosal edema and rhinorrhea present.  Mouth/Throat: Posterior oropharyngeal erythema present. No oropharyngeal exudate or posterior oropharyngeal edema.  Eyes: No scleral icterus.  Neck: Neck supple.  Cardiovascular: Regular rhythm and normal heart sounds.  Pulmonary/Chest: Effort normal and breath sounds normal. No respiratory distress.  Neurological: She is alert and oriented to person, place, and time.  Skin: Skin is warm and dry.  Psychiatric: She has a normal mood and affect. Her speech is normal.    ED Course  Procedures (including critical care time)  Labs Reviewed - No data to display No results found.   1. Acute upper respiratory infections of unspecified site   2. Sinusitis       MDM  1)  Take the prescribed antibiotic as instructed.  Is not improving she should follow up with her PCP who can consider further allergy treatment referral to ENT. 2)  Use nasal saline solution (over the counter) at least 3 times a day. 3)  Use over the counter  decongestants like Zyrtec-D every 12 hours as needed to help with congestion.  If you have hypertension, do not take medicines with sudafed.  4)  Can take tylenol every 6 hours or motrin every 8 hours for pain or fever. 5)  Follow up with your primary doctor if no improvement in 5-7 days, sooner if increasing pain, fever, or new symptoms.     Marlaine Hind, MD 05/08/11 [phone]

## 2011-05-11 ENCOUNTER — Telehealth: Payer: Self-pay | Admitting: *Deleted

## 2011-05-11 MED ORDER — ONDANSETRON 4 MG PO TBDP: 4.0000 mg | ORAL_TABLET | Freq: Three times a day (TID) | ORAL | Status: AC | PRN

## 2011-05-11 NOTE — Telephone Encounter (Signed)
Pt calls and woke up during middle of night with nausea and vomiting and diarrhea. Would like something for the nausea. Had hysterectomy on 2/12 and has been released for this. Seen at [facility] for sinus infection and is on antibiotics for this as well

## 2011-05-11 NOTE — Telephone Encounter (Signed)
Sent zofran to [facility].

## 2011-05-11 NOTE — Telephone Encounter (Signed)
Pt notified med sent to pharmacy. KJ LPN 

## 2011-07-13 ENCOUNTER — Emergency Department
Admission: EM | Admit: 2011-07-13 | Discharge: 2011-07-13 | Disposition: A | Discharge status: Home / Self Care | Payer: Managed Care, Other (non HMO) | Source: Home / Self Care | Attending: Family Medicine | Admitting: Family Medicine

## 2011-07-13 ENCOUNTER — Encounter: Payer: Self-pay | Admitting: *Deleted

## 2011-07-13 DIAGNOSIS — L255 Unspecified contact dermatitis due to plants, except food: Secondary | ICD-10-CM

## 2011-07-13 MED ORDER — TRIAMCINOLONE ACETONIDE 40 MG/ML IJ SUSP
40.0000 mg | Freq: Once | INTRAMUSCULAR | Status: AC
  Administered 2011-07-13: 40 mg via INTRAMUSCULAR

## 2011-07-13 NOTE — Discharge Instructions (Signed)
Continue Benadryl at bedtime, or consider Allegra, Claritin, or Zyrtec daytime.  Poison [employer] ivy is a inflammation of the skin (contact dermatitis) caused by touching the allergens on the leaves of the ivy plant following previous exposure to the plant. The rash usually appears 48 hours after exposure. The rash is usually bumps (papules) or blisters (vesicles) in a linear pattern. Depending on your own sensitivity, the rash may simply cause redness and itching, or it may also progress to blisters which may break open. These must be well cared for to prevent secondary bacterial (germ) infection, followed by scarring. Keep any open areas dry, clean, dressed, and covered with an antibacterial ointment if needed. The eyes may also get puffy. The puffiness is worst in the morning and gets better as the day progresses. This dermatitis usually heals without scarring, within 2 to 3 weeks without treatment. HOME CARE INSTRUCTIONS  Thoroughly wash with soap and water as soon as you have been exposed to poison ivy. You have about one half hour to remove the plant resin before it will cause the rash. This washing will destroy the oil or antigen on the skin that is causing, or will cause, the rash. Be sure to wash under your fingernails as any plant resin there will continue to spread the rash. Do not rub skin vigorously when washing affected area. Poison ivy cannot spread if no oil from the plant remains on your body. A rash that has progressed to weeping sores will not spread the rash unless you have not washed thoroughly. It is also important to wash any clothes you have been wearing as these may carry active allergens. The rash will return if you wear the unwashed clothing, even several days later. Avoidance of the plant in the future is the best measure. Poison ivy plant can be recognized by the number of leaves. Generally, poison ivy has three leaves with flowering branches on a single stem. Diphenhydramine  may be purchased over the counter and used as needed for itching. Do not drive with this medication if it makes you drowsy.Ask your caregiver about medication for children. SEEK MEDICAL CARE IF:  Open sores develop.   Redness spreads beyond area of rash.   You notice purulent (pus-like) discharge.   You have increased pain.   Other signs of infection develop (such as fever).  Document Released: 01/30/2000 Document Revised: 01/21/2011 Document Reviewed: 12/18/2008 [facility] Patient Information 2012 [place], [place].

## 2011-07-13 NOTE — ED Notes (Signed)
Pt c/o rash/poison ivy x 3 days on her arms and chest area. She has applied rubbing alcohol.

## 2011-07-13 NOTE — ED Provider Notes (Signed)
History     CSN: [id number]  Arrival date & time 07/13/11  1742   First MD Initiated Contact with Patient 07/13/11 1815      Chief Complaint  Patient presents with  . Poison Ivy      HPI Comments: Patient complains of contacting poison ivy 3 days ago, and now has pruritic rash.  She feels well  Otherwise.  Patient is a 46 y.o. female presenting with Poison Ivy. The history is provided by the patient.  Poison [place] This is a new problem. Episode onset: 3 days ago. The problem occurs constantly. The problem has been gradually worsening. Associated symptoms comments: none. The symptoms are aggravated by nothing. The symptoms are relieved by nothing. Treatments tried: applied rubbing alcohol. The treatment provided no relief.    Past Medical History  Diagnosis Date  . Hypothyroidism   . PONV (postoperative nausea and vomiting)   . Abscess of peritoneum     Past Surgical History  Procedure Date  . Appendectomy   . Cervical back fusion     Upper back.   . Cesarean section   . Cholecystectomy   . Tonsillectomy   . Abdominal hysterectomy   . Adenoidectomy   . Wisdom tooth extraction     Family History  Problem Relation Age of Onset  . Cancer Mother     lung  . Lupus Mother   . Heart disease Father     4 vessel bypass, smoker  . Heart failure Father   . Cancer Father     lung  . Stroke Maternal Grandmother   . Lung cancer Maternal Grandmother   . Bone cancer Paternal Grandfather     History  Substance Use Topics  . Smoking status: Former Smoker    Quit date: 08/15/2001  . Smokeless tobacco: Not on file  . Alcohol Use: No    OB History    Grav Para Term Preterm Abortions TAB SAB Ect Mult Living                  Review of Systems  All other systems reviewed and are negative.    Allergies  Penicillins  Home Medications   Current Outpatient Rx  Name Route Sig Dispense Refill  . DOXYCYCLINE HYCLATE 100 MG PO CAPS Oral Take 100 mg by mouth 2 (two)  times daily.    Marland Kitchen CALCIUM CARBONATE ANTACID 500 MG PO CHEW Oral Chew 1 tablet by mouth at bedtime.    . OMEGA-3 FATTY ACIDS 1200 MG PO CAPS Oral Take 2,400 mg by mouth 2 (two) times daily.    Marland Kitchen CHILDRENS CHEWABLE MULTI VITS PO CHEW Oral Chew 1 tablet by mouth daily.    Marland Kitchen SYNTHROID 150 MCG PO TABS Oral Take 1 tablet (150 mcg total) by mouth daily. Brand name synthroid only 30 tablet 6    Dispense as written.    BP 131/78  Pulse 70  Temp(Src) 98.7 F (37.1 C) (Oral)  Resp 16  Ht 5' 4.5" (1.638 m)  Wt 184 lb 4 oz (83.575 kg)  BMI 31.14 kg/m2  SpO2 98%  LMP 03/07/2011  Physical Exam  Constitutional: She is oriented to person, place, and time. She appears well-developed and well-nourished. No distress.  Eyes: Conjunctivae are normal. Pupils are equal, round, and reactive to light.  Neurological: She is alert and oriented to person, place, and time.  Skin: Skin is warm and dry. Rash noted.          There are  scattered erythematous linear lesions and macules on chest and arms.  No evidence cellulitis.    ED Course  Procedures none      1. Rhus dermatitis       MDM   Kenalog 40mg  IM Continue Benadryl at bedtime, or consider Allegra, Claritin, or Zyrtec daytime. Followup with Family Doctor if not improved in about 5 days.        Lattie Haw, MD 07/15/11 [phone]

## 2011-07-22 ENCOUNTER — Encounter: Payer: Self-pay | Admitting: Family Medicine

## 2011-07-22 ENCOUNTER — Ambulatory Visit (INDEPENDENT_AMBULATORY_CARE_PROVIDER_SITE_OTHER): Payer: Managed Care, Other (non HMO) | Admitting: Family Medicine

## 2011-07-22 VITALS — BP 129/73 | HR 87 | Ht 65.25 in | Wt 184.0 lb

## 2011-07-22 DIAGNOSIS — L255 Unspecified contact dermatitis due to plants, except food: Secondary | ICD-10-CM

## 2011-07-22 DIAGNOSIS — L237 Allergic contact dermatitis due to plants, except food: Secondary | ICD-10-CM

## 2011-07-22 MED ORDER — PREDNISONE 20 MG PO TABS: ORAL_TABLET | ORAL | Status: DC

## 2011-07-22 NOTE — Patient Instructions (Signed)
Poison Ivy Poison ivy is a inflammation of the skin (contact dermatitis) caused by touching the allergens on the leaves of the ivy plant following previous exposure to the plant. The rash usually appears 48 hours after exposure. The rash is usually bumps (papules) or blisters (vesicles) in a linear pattern. Depending on your own sensitivity, the rash may simply cause redness and itching, or it may also progress to blisters which may break open. These must be well cared for to prevent secondary bacterial (germ) infection, followed by scarring. Keep any open areas dry, clean, dressed, and covered with an antibacterial ointment if needed. The eyes may also get puffy. The puffiness is worst in the morning and gets better as the day progresses. This dermatitis usually heals without scarring, within 2 to 3 weeks without treatment. HOME CARE INSTRUCTIONS  Thoroughly wash with soap and water as soon as you have been exposed to poison ivy. You have about one half hour to remove the plant resin before it will cause the rash. This washing will destroy the oil or antigen on the skin that is causing, or will cause, the rash. Be sure to wash under your fingernails as any plant resin there will continue to spread the rash. Do not rub skin vigorously when washing affected area. Poison ivy cannot spread if no oil from the plant remains on your body. A rash that has progressed to weeping sores will not spread the rash unless you have not washed thoroughly. It is also important to wash any clothes you have been wearing as these may carry active allergens. The rash will return if you wear the unwashed clothing, even several days later. Avoidance of the plant in the future is the best measure. Poison ivy plant can be recognized by the number of leaves. Generally, poison ivy has three leaves with flowering branches on a single stem. Diphenhydramine may be purchased over the counter and used as needed for itching. Do not drive with  this medication if it makes you drowsy.Ask your caregiver about medication for children. SEEK MEDICAL CARE IF:  Open sores develop.   Redness spreads beyond area of rash.   You notice purulent (pus-like) discharge.   You have increased pain.   Other signs of infection develop (such as fever).  Document Released: 01/30/2000 Document Revised: 01/21/2011 Document Reviewed: 12/18/2008 [employer] Patient Information 2012 [employer]. 

## 2011-07-22 NOTE — Progress Notes (Signed)
  Subjective:    Patient ID: Kim Livingston, female    DOB: 11-05-1965, 47 y.o.   MRN: [id number]  HPI Saw Dr. Cathren Harsh on 5/28 for poison ivey and got a shot initally.  Says thinks got some better but now has new lesions.  On the groin craesed, breast creases , upper chest, neck and both arms. No shortness of breath or respiratory symptoms.   Review of Systems     Objective:   Physical Exam  Constitutional: She is oriented to person, place, and time. She appears well-developed and well-nourished.  Neurological: She is alert and oriented to person, place, and time.  Skin: Skin is warm and dry.       She has erythematous papules some of which are in a linear formation on both upper arms, her neck, her upper chest, under both breasts, and along her groin creases. She has been scratching at them, there are multiple excoriations and some small scabs.  Psychiatric: She has a normal mood and affect. Her behavior is normal.          Assessment & Plan:  Poison [place]-discussed options of a topical steroid versus oral steroids for about 10 days. I did explain to her that he can often have a delayed reaction with poison ivy. She has not recontaminated herself or spread it by scratching. She is currently using calamine otion to help soothe it she can continue this. I did give her prescription for prednisone 40 mg for 5 days then 20 mg for 5 days then stop. I then she should really not be developing any new lesions as long as she has not reexposed herself.

## 2011-08-09 ENCOUNTER — Other Ambulatory Visit: Payer: Self-pay | Admitting: *Deleted

## 2011-08-09 MED ORDER — SYNTHROID 150 MCG PO TABS: 150.0000 ug | ORAL_TABLET | Freq: Every day | ORAL | Status: DC

## 2011-10-19 ENCOUNTER — Encounter: Payer: Self-pay | Admitting: Family Medicine

## 2011-10-19 ENCOUNTER — Ambulatory Visit (INDEPENDENT_AMBULATORY_CARE_PROVIDER_SITE_OTHER): Payer: Managed Care, Other (non HMO) | Admitting: Family Medicine

## 2011-10-19 VITALS — BP 162/91 | HR 98 | Wt 193.0 lb

## 2011-10-19 DIAGNOSIS — L719 Rosacea, unspecified: Secondary | ICD-10-CM | POA: Insufficient documentation

## 2011-10-19 DIAGNOSIS — Z0000 Encounter for general adult medical examination without abnormal findings: Secondary | ICD-10-CM

## 2011-10-19 NOTE — Progress Notes (Signed)
  Subjective:     Kim Livingston is a 46 y.o. female and is here for a comprehensive physical exam. The patient reports no problems.  History   Social History  . Marital Status: Married    Spouse Name: Reita Cliche    Number of Children: 1  . Years of Education: N/A   Occupational History  . MANAGER     Social History Main Topics  . Smoking status: Former Smoker    Quit date: 08/15/2001  . Smokeless tobacco: Not on file  . Alcohol Use: No  . Drug Use: No  . Sexually Active: Yes -- Female partner(s)   Other Topics Concern  . Not on file   Social History Narrative   Some exercise.  2 cup caffeine daily.    Health Maintenance  Topic Date Due  . Influenza Vaccine  11/16/2011  . Pap Smear  01/15/2013  . Tetanus/tdap  09/11/2019    The following portions of the patient's history were reviewed and updated as appropriate: allergies, current medications, past family history, past medical history, past social history, past surgical history and problem list.  Review of Systems A comprehensive review of systems was negative.   Objective:   BP 162/91  Pulse 98  Wt 193 lb (87.544 kg)  LMP 03/07/2011 General appearance: alert, cooperative and appears stated age Head: Normocephalic, without obvious abnormality, atraumatic Eyes: conj clear, EOMi, PEERLA Ears: normal TM's and external ear canals both ears Nose: Nares normal. Septum midline. Mucosa normal. No drainage or sinus tenderness. Throat: lips, mucosa, and tongue normal; teeth and gums normal Neck: no adenopathy, no carotid bruit, no JVD, supple, symmetrical, trachea midline and thyroid not enlarged, symmetric, no tenderness/mass/nodules Back: no tenderness to percussion or palpation, symmetric, no curvature. ROM normal. No CVA tenderness. Lungs: clear to auscultation bilaterally Heart: regular rate and rhythm, S1, S2 normal, no murmur, click, rub or gallop Abdomen: soft, non-tender; bowel sounds normal; no masses,  no  organomegaly Extremities: extremities normal, atraumatic, no cyanosis or edema Pulses: 2+ and symmetric Skin: Skin color, texture, turgor normal. No rashes or lesions Lymph nodes: Cervical, supraclavicular, and axillary nodes normal. Neurologic: Alert and oriented X 3, normal strength and tone. Normal symmetric reflexes. Normal coordination and gait   Assessment:    Healthy female exam.      Plan:     See After Visit Summary for Counseling Recommendations  Start a regular exercise program and make sure you are eating a healthy diet Try to eat 4 servings of dairy a day  Your vaccines are up to date.   Given lab slip to check CMP, fasting lipid panel. Just has a history hypothyroidism to recheck her TSH as well. She does see a GYN in and get her mammograms yearly. She has had a hysterectomy and does not require a Pap smear.

## 2011-10-19 NOTE — Patient Instructions (Addendum)
Start a regular exercise program and make sure you are eating a healthy diet Try to eat 4 servings of dairy a day  Your vaccines are up to date.  Avoid decongestants, IBuprofen, and Aleve and recheck BP with nurse visit in 2-3 weeks.

## 2011-11-04 ENCOUNTER — Ambulatory Visit (INDEPENDENT_AMBULATORY_CARE_PROVIDER_SITE_OTHER): Payer: Managed Care, Other (non HMO) | Admitting: Family Medicine

## 2011-11-04 VITALS — BP 129/67 | HR 68

## 2011-11-04 DIAGNOSIS — R030 Elevated blood-pressure reading, without diagnosis of hypertension: Secondary | ICD-10-CM

## 2011-11-04 DIAGNOSIS — IMO0001 Reserved for inherently not codable concepts without codable children: Secondary | ICD-10-CM

## 2011-11-04 LAB — LIPID PANEL
Cholesterol: 157 mg/dL (ref 0–200)
HDL: 46 mg/dL (ref >39)
Total CHOL/HDL Ratio: 3.4 Ratio
Triglycerides: 136 mg/dL (ref <150)
VLDL: 27 mg/dL (ref 0–40)

## 2011-11-04 LAB — HEMOGLOBIN A1C: Hgb A1c MFr Bld: 5.6 % (ref <5.7)

## 2011-11-04 NOTE — Progress Notes (Signed)
Pt informed

## 2011-11-04 NOTE — Progress Notes (Addendum)
  Subjective:    Patient ID: Kim Livingston, female    DOB: Jul 16, 1965, 46 y.o.   MRN: [id number] Pt denies CP, SOB, dizziness, or heart palpitations. taking meds as directed without problems. Denies med side effects. 5 min spent with pt.  HPI    Review of Systems     Objective:   Physical Exam        Assessment & Plan:  Elevated BP- BP looks fantastic!  Keep regular followup. Nani Gasser, MD

## 2011-11-05 ENCOUNTER — Telehealth: Payer: Self-pay | Admitting: *Deleted

## 2011-11-05 LAB — COMPLETE METABOLIC PANEL WITH GFR
AST: 25 U/L (ref 0–37)
Albumin: 4 g/dL (ref 3.5–5.2)
Alkaline Phosphatase: 55 U/L (ref 39–117)
BUN: 9 mg/dL (ref 6–23)
Creat: 0.69 mg/dL (ref 0.50–1.10)
GFR, Est Non African American: >89 mL/min
Glucose, Bld: 77 mg/dL (ref 70–99)
Total Bilirubin: 0.4 mg/dL (ref 0.3–1.2)

## 2011-11-05 NOTE — Telephone Encounter (Signed)
Message copied by Florestine Avers on Fri Nov 05, 2011  8:28 AM ------      Message from: Nani Gasser D      Created: Fri Nov 05, 2011  8:13 AM       Call pt: CMP is normal. One of liver enzymes is elevated.  Make sure avoid alcohol and tylenol and eating low fat ND recheck in 1 months. Lipids are normal. Thyroid and A1C is normal. No diabetes.

## 2011-11-05 NOTE — Telephone Encounter (Signed)
Left message to call back about labs

## 2011-11-11 ENCOUNTER — Encounter: Payer: Self-pay | Admitting: Family Medicine

## 2011-11-11 ENCOUNTER — Ambulatory Visit (INDEPENDENT_AMBULATORY_CARE_PROVIDER_SITE_OTHER): Payer: Managed Care, Other (non HMO) | Admitting: Family Medicine

## 2011-11-11 VITALS — BP 166/81 | HR 84

## 2011-11-11 DIAGNOSIS — R748 Abnormal levels of other serum enzymes: Secondary | ICD-10-CM

## 2011-11-11 DIAGNOSIS — R591 Generalized enlarged lymph nodes: Secondary | ICD-10-CM

## 2011-11-11 DIAGNOSIS — R7401 Elevation of levels of liver transaminase levels: Secondary | ICD-10-CM

## 2011-11-11 DIAGNOSIS — R609 Edema, unspecified: Secondary | ICD-10-CM

## 2011-11-11 DIAGNOSIS — R599 Enlarged lymph nodes, unspecified: Secondary | ICD-10-CM

## 2011-11-11 DIAGNOSIS — R600 Localized edema: Secondary | ICD-10-CM

## 2011-11-11 DIAGNOSIS — R319 Hematuria, unspecified: Secondary | ICD-10-CM

## 2011-11-11 LAB — POCT URINALYSIS DIPSTICK
Glucose, UA: NEGATIVE
Leukocytes, UA: NEGATIVE
Nitrite, UA: NEGATIVE
Protein, UA: NEGATIVE
Urobilinogen, UA: 0.2

## 2011-11-11 MED ORDER — HYDROCHLOROTHIAZIDE 25 MG PO TABS: 25.0000 mg | ORAL_TABLET | Freq: Every day | ORAL | Status: DC

## 2011-11-11 NOTE — Patient Instructions (Addendum)
If labs ok can start Ibuprofen 600mg  3 x a day with food and water

## 2011-11-11 NOTE — Progress Notes (Signed)
  Subjective:    Patient ID: Kim Livingston, female    DOB: 03-23-1965, 46 y.o.   MRN: [id number]  HPI Right gland swollen on the right side of neck.  Happened years ago and lasted 3 weeks before it went away.  It is painful and swollen. Did take a childs dose of IBU this AM.  + ST.  No fever. She says the ibuprofen does help her pain. There is some mild odynophagia on that side of her neck. No dysphagia.  Her weight is up 4 lbs and has had swollen ankles. BP is up.  She does report that she did have some mild, vague epigastric discomfort a week or so ago. Has resolved and has not returned. No vomiting. No headaches. No chest pain or palpitations.   Review of Systems     Objective:   Physical Exam  Constitutional: She is oriented to person, place, and time. She appears well-developed and well-nourished.  HENT:  Head: Normocephalic and atraumatic.  Right Ear: External ear normal.  Left Ear: External ear normal.  Nose: Nose normal.  Mouth/Throat: Oropharynx is clear and moist.       TMs and canals are clear.   Eyes: Conjunctivae normal and EOM are normal. Pupils are equal, round, and reactive to light.  Neck: Neck supple. No thyromegaly present.       Palpable swollen lymph node right anterior cervical area. No erythema. No bogginess. It is mildly firm but not hard.  Cardiovascular: Normal rate, regular rhythm and normal heart sounds.   Pulmonary/Chest: Effort normal and breath sounds normal. She has no wheezes.  Abdominal: Soft. Bowel sounds are normal. She exhibits no distension and no mass. There is no tenderness. There is no rebound and no guarding.  Musculoskeletal: She exhibits edema.       Trace ankle edema on the right. 1+ edema on the left ankle and foot.  Lymphadenopathy:    She has cervical adenopathy.  Neurological: She is alert and oriented to person, place, and time.  Skin: Skin is warm and dry.  Psychiatric: She has a normal mood and affect.          Assessment &  Plan:  Lymphadenopathy unclear etiology-expand her that can be caused by virus, bacteria, allergies. We will go ahead and check for strep though her pharynx looks clear. We'll check a CBC with differential, ASO, recheck liver enzymes.  Elevated liver enzymes-recheck today. We will also check for hepatitis.  Lower extremity edema-unclear etiology. Her recent kidney function was normal. Will check UA to rule out proteinuria as well. We'll go ahead and start HCTZ daily since her blood pressure is elevated and she's had ankle swelling. These too may be correlated. Often times her blood pressure is high it can sometimes increase venous stasis.  Hematuria- I was checking urinalysis for possible pregnancy and actually was positive for blood. We'll send it for a microscopic analysis as she has had a hysterectomy and has a recently passed blood in her urine.

## 2011-11-12 LAB — CBC WITH DIFFERENTIAL/PLATELET
Basophils Relative: 0 % (ref 0–1)
Eosinophils Absolute: 0.3 10*3/uL (ref 0.0–0.7)
Lymphs Abs: 2.7 10*3/uL (ref 0.7–4.0)
MCH: 30.9 pg (ref 26.0–34.0)
MCHC: 34.1 g/dL (ref 30.0–36.0)
Neutro Abs: 5.7 10*3/uL (ref 1.7–7.7)
Neutrophils Relative %: 60 % (ref 43–77)
Platelets: 271 10*3/uL (ref 150–400)
RBC: 4.57 MIL/uL (ref 3.87–5.11)

## 2011-11-12 LAB — URINALYSIS, MICROSCOPIC ONLY: Squamous Epithelial / LPF: NONE SEEN

## 2011-11-12 LAB — HEPATIC FUNCTION PANEL
Albumin: 4.2 g/dL (ref 3.5–5.2)
Bilirubin, Direct: 0.1 mg/dL (ref 0.0–0.3)
Total Bilirubin: 0.3 mg/dL (ref 0.3–1.2)

## 2011-11-12 LAB — HEPATITIS PANEL, ACUTE
HCV Ab: NEGATIVE
Hep A IgM: NEGATIVE
Hep B C IgM: NEGATIVE

## 2011-11-12 LAB — ANTISTREPTOLYSIN O TITER: ASO: 65 IU/mL (ref <409)

## 2012-01-05 ENCOUNTER — Other Ambulatory Visit: Payer: Self-pay

## 2012-01-05 MED ORDER — HYDROCHLOROTHIAZIDE 25 MG PO TABS: 25.0000 mg | ORAL_TABLET | Freq: Every day | ORAL | Status: DC

## 2012-02-24 ENCOUNTER — Other Ambulatory Visit: Payer: Self-pay | Admitting: Family Medicine

## 2012-03-01 ENCOUNTER — Ambulatory Visit (INDEPENDENT_AMBULATORY_CARE_PROVIDER_SITE_OTHER): Payer: Managed Care, Other (non HMO) | Admitting: Family Medicine

## 2012-03-01 ENCOUNTER — Encounter: Payer: Self-pay | Admitting: Family Medicine

## 2012-03-01 ENCOUNTER — Other Ambulatory Visit: Payer: Self-pay | Admitting: Family Medicine

## 2012-03-01 VITALS — BP 125/72 | HR 91 | Temp 99.5°F | Resp 16 | Wt 185.0 lb

## 2012-03-01 DIAGNOSIS — M549 Dorsalgia, unspecified: Secondary | ICD-10-CM

## 2012-03-01 DIAGNOSIS — I10 Essential (primary) hypertension: Secondary | ICD-10-CM

## 2012-03-01 DIAGNOSIS — R509 Fever, unspecified: Secondary | ICD-10-CM

## 2012-03-01 DIAGNOSIS — E039 Hypothyroidism, unspecified: Secondary | ICD-10-CM

## 2012-03-01 LAB — POCT URINALYSIS DIPSTICK
Bilirubin, UA: NEGATIVE
Glucose, UA: NEGATIVE
Ketones, UA: NEGATIVE
Spec Grav, UA: <=1.005
Urobilinogen, UA: 0.2

## 2012-03-01 MED ORDER — CYCLOBENZAPRINE HCL 10 MG PO TABS: 5.0000 mg | ORAL_TABLET | Freq: Every evening | ORAL | Status: DC | PRN

## 2012-03-01 MED ORDER — HYDROCHLOROTHIAZIDE 25 MG PO TABS: 25.0000 mg | ORAL_TABLET | Freq: Every day | ORAL | Status: DC

## 2012-03-01 NOTE — Patient Instructions (Signed)

## 2012-03-01 NOTE — Progress Notes (Signed)
  Subjective:    Patient ID: Kim Livingston, female    DOB: 07/15/1965, 47 y.o.   MRN: [id number]  HPI Low back pain on right x 1 day after long drive from [place].  She has also been wearing high heels and carrying a heavy bag that day.  Then woke up with fever and chills otday.  Took Tylenol and IBU this AM.  Says still feels a little achy on the NSAID.  + urinary frequency.  No feeling full.  Hx of hematuria.  + sick contracts. No ST.  No runny nose or cough.  Back pain is worse with movement.  Better when sit. Worse with standing.  Her area of pain is near her right SI joint when she points to the area. She denies any upper respiratory symptoms including sore throat, cough, headache, nasal congestion, or abdominal pain. No blood in the stool. No change in bowel movements. No nausea or vomiting.   Review of Systems     Objective:   Physical Exam  Constitutional: She is oriented to person, place, and time. She appears well-developed and well-nourished.  HENT:  Head: Normocephalic and atraumatic.  Right Ear: External ear normal.  Left Ear: External ear normal.  Nose: Nose normal.  Mouth/Throat: Oropharynx is clear and moist.       TMs and canals are clear.   Eyes: Conjunctivae normal and EOM are normal. Pupils are equal, round, and reactive to light.  Neck: Neck supple. No thyromegaly present.  Cardiovascular: Normal rate, regular rhythm and normal heart sounds.   Pulmonary/Chest: Effort normal and breath sounds normal. She has no wheezes.  Musculoskeletal:       Pain in the right low back with flexion to about 25. Normal extension. Normal rotation right and left. Normal side bending. She's mildly tender over the paraspinous muscles on the right around the lumbar spine and near the SI joint. Negative straight leg raise bilaterally. Hip, knee, ankle strength is 5 out of 5 bilaterally.  Patellar reflexes 1+ bilat.   Lymphadenopathy:    She has no cervical adenopathy.  Neurological: She is  alert and oriented to person, place, and time.  Skin: Skin is warm and dry.  Psychiatric: She has a normal mood and affect.          Assessment & Plan:  Fever- unclear etiology. She has no other signs of respiratory type symptoms. Her lungs are clear exam. Her abdomen is completely nontender with no masses. Primarily she has significant findings for low back musculoskeletal problems. We will send her urine for culture because she did have hematuria. She says she has chronic hematuria so this may not be new. I asked her to call her office immediately if she spikes a fever greater than 102, or she develops any new symptoms, especially abdominal pain or gross hematuria, nausea or vomiting.  Right Low Back Pain - likely musculoskeletal strain.Recommend IBU 600mg  TID and will add muscle relaxer at bedtime prn. Given H.O. On stretches to do at home. Cal if not better in 2-3 weeks.  Call if getting worse or develops abdominal pain.    HTN- well controlled. She does need refills medication doing very well. She feels like the fluid pill has been helping her swelling. Due to check potassium today.  Hypothyroid-due to recheck TSH today. Refill sent to pharmacy.

## 2012-03-02 LAB — COMPLETE METABOLIC PANEL WITH GFR
ALT: 43 U/L — ABNORMAL HIGH (ref 0–35)
CO2: 28 mEq/L (ref 19–32)
Chloride: 100 mEq/L (ref 96–112)
GFR, Est African American: >89 mL/min
Potassium: 3.9 mEq/L (ref 3.5–5.3)
Sodium: 139 mEq/L (ref 135–145)
Total Bilirubin: 0.4 mg/dL (ref 0.3–1.2)
Total Protein: 6.5 g/dL (ref 6.0–8.3)

## 2012-03-02 LAB — TSH: TSH: 1.33 u[IU]/mL (ref 0.350–4.500)

## 2012-03-06 LAB — HEPATITIS PANEL, ACUTE: Hep A IgM: NEGATIVE

## 2012-03-27 ENCOUNTER — Other Ambulatory Visit: Payer: Self-pay | Admitting: Family Medicine

## 2012-04-10 ENCOUNTER — Other Ambulatory Visit: Payer: Self-pay

## 2012-04-12 ENCOUNTER — Other Ambulatory Visit: Payer: Self-pay | Admitting: Radiology

## 2012-04-17 ENCOUNTER — Ambulatory Visit (INDEPENDENT_AMBULATORY_CARE_PROVIDER_SITE_OTHER): Payer: Self-pay | Admitting: General Surgery

## 2012-04-17 ENCOUNTER — Telehealth (INDEPENDENT_AMBULATORY_CARE_PROVIDER_SITE_OTHER): Payer: Self-pay | Admitting: General Surgery

## 2012-04-17 NOTE — Telephone Encounter (Signed)
Called the patient at her work number and patient confirmed that she still wanted to be seen today as long as Dr. Derrell Lolling was going to be here. Advised patient the option to reschedule was being presented due to the impending bad weather.

## 2012-04-17 NOTE — Telephone Encounter (Signed)
Called patient and rescheduled appointment to Friday 04/21/12 at 5:00 due to the weather.

## 2012-04-21 ENCOUNTER — Ambulatory Visit (INDEPENDENT_AMBULATORY_CARE_PROVIDER_SITE_OTHER): Payer: Self-pay | Admitting: General Surgery

## 2012-04-25 ENCOUNTER — Encounter (INDEPENDENT_AMBULATORY_CARE_PROVIDER_SITE_OTHER): Payer: Self-pay | Admitting: General Surgery

## 2012-04-26 ENCOUNTER — Encounter: Payer: Self-pay | Admitting: Family Medicine

## 2012-05-01 ENCOUNTER — Ambulatory Visit (INDEPENDENT_AMBULATORY_CARE_PROVIDER_SITE_OTHER): Payer: Managed Care, Other (non HMO) | Admitting: General Surgery

## 2012-05-01 ENCOUNTER — Encounter (INDEPENDENT_AMBULATORY_CARE_PROVIDER_SITE_OTHER): Payer: Self-pay | Admitting: General Surgery

## 2012-05-01 ENCOUNTER — Encounter (HOSPITAL_BASED_OUTPATIENT_CLINIC_OR_DEPARTMENT_OTHER): Payer: Self-pay | Admitting: *Deleted

## 2012-05-01 VITALS — BP 132/70 | HR 80 | Temp 98.1°F | Resp 16 | Ht 64.5 in | Wt 189.0 lb

## 2012-05-01 DIAGNOSIS — R920 Mammographic microcalcification found on diagnostic imaging of breast: Secondary | ICD-10-CM

## 2012-05-01 NOTE — Progress Notes (Signed)
Patient ID: Kim Livingston, female   DOB: [birth year], 47 y.o.   MRN: [id number]  Chief Complaint  Patient presents with  . New Evaluation    eval rt br papalloma    HPI Kim Livingston is a 47 y.o. female.  She is referred by Dr. Zella Richer for evaluation of abnormal mammogram of the right breast, papilloma, central retroareolar area.  This patient has not had any breast problems in the past. Recent screening mammogram showed some microcalcifications in the mid retroareolar area, a small focal area. There is also a cystlike structure in the right breast upper outer quadrant. Biopsy of both of these areas were performed. In the central breast there were fragments of papilloma. The upper outer quadrant there was benign fibrocystic disease with calcifications. She was referred for excision of the central papilloma.  Past history reveals hyperthyroidism which spontaneously became hypothyroidism.  Labs this. Laparoscopic cholecystectomy. Robotic hysterectomy.  History reveals breast cancer and 2 paternal cousins and maternal aunt had a cancerous disease and had bilateral mastectomies. No ovarian cancer.  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 Denies tobacco.  HPI  Past Medical History  Diagnosis Date  . Hypothyroidism   . PONV (postoperative nausea and vomiting)   . Abscess of peritoneum     Past Surgical History  Procedure Laterality Date  . Appendectomy    . Cervical back fusion      Upper back.   . Cesarean section    . Cholecystectomy    . Tonsillectomy    . Abdominal hysterectomy    . Adenoidectomy    . Wisdom tooth extraction      Family History  Problem Relation Age of Onset  . Lung cancer Mother   . Lupus Mother   . Cancer Mother     lung  . Heart disease Father     4 vessel bypass, smoker  . Heart failure Father   . Lung cancer Maternal Grandmother   . Stroke Maternal Grandmother   . Bone cancer Paternal Grandfather    . Cancer Maternal Aunt     Social History History  Substance Use Topics  . Smoking status: Former Smoker    Quit date: 08/15/2001  . Smokeless tobacco: Not on file  . Alcohol Use: No    Allergies  Allergen Reactions  . Penicillins     REACTION: hives    Current Outpatient Prescriptions  Medication Sig Dispense Refill  . Azelaic Acid (FINACEA) 15 % cream Apply 1 application topically 2 (two) times daily. After skin is thoroughly washed and patted dry, gently but thoroughly massage a thin film of azelaic acid cream into the affected area twice daily, in the morning and evening.      . calcium carbonate (TUMS - DOSED IN MG ELEMENTAL CALCIUM) 500 MG chewable tablet Chew 1 tablet by mouth at bedtime.      . cyclobenzaprine (FLEXERIL) 10 MG tablet Take 0.5-1 tablets (5-10 mg total) by mouth at bedtime as needed for muscle spasms.  20 tablet  0  . hydrochlorothiazide (HYDRODIURIL) 25 MG tablet Take 1 tablet (25 mg total) by mouth daily.  90 tablet  1  . Omega-3 Fatty Acids 1200 MG CAPS Take 2,400 mg by mouth 2 (two) times daily.      . Pediatric Multiple Vit-C-FA (PEDIATRIC MULTIVITAMIN) chewable tablet Chew 1 tablet by mouth daily.      Marland Kitchen SYNTHROID 150 MCG tablet TAKE 1 TABLET (150  MCG TOTAL) BY MOUTH DAILY. BRAND NAME SYNTHROID ONLY  30 tablet  5   No current facility-administered medications for this visit.    Review of Systems Review of Systems  Constitutional: Negative for fever, chills and unexpected weight change.  HENT: Negative for hearing loss, congestion, sore throat, trouble swallowing and voice change.   Eyes: Negative for visual disturbance.  Respiratory: Negative for cough and wheezing.   Cardiovascular: Negative for chest pain, palpitations and leg swelling.  Gastrointestinal: Negative for nausea, vomiting, abdominal pain, diarrhea, constipation, blood in stool, abdominal distention and anal bleeding.  Genitourinary: Negative for hematuria, vaginal bleeding and  difficulty urinating.  Musculoskeletal: Negative for arthralgias.  Skin: Negative for rash and wound.  Neurological: Negative for seizures, syncope and headaches.  Hematological: Negative for adenopathy. Does not bruise/bleed easily.  Psychiatric/Behavioral: Negative for confusion.    Blood pressure 132/70, pulse 80, temperature 98.1 F (36.7 C), temperature source Temporal, resp. rate 16, height 5' 4.5" (1.638 m), weight 189 lb (85.73 kg), last menstrual period 03/07/2011.  Physical Exam Physical Exam  Constitutional: She is oriented to person, place, and time. She appears well-developed and well-nourished. No distress.  HENT:  Head: Normocephalic and atraumatic.  Nose: Nose normal.  Mouth/Throat: No oropharyngeal exudate.  Eyes: Conjunctivae and EOM are normal. Pupils are equal, round, and reactive to light. Left eye exhibits no discharge. No scleral icterus.  Neck: Neck supple. No JVD present. No tracheal deviation present. No thyromegaly present.  Cardiovascular: Normal rate, regular rhythm, normal heart sounds and intact distal pulses.   No murmur heard. Pulmonary/Chest: Effort normal and breath sounds normal. No respiratory distress. She has no wheezes. She has no rales. She exhibits no tenderness.  Breasts are large. Skin nipple and areolar are normal bilaterally. In the right breast there may be a little bit of thickening in the upper outer quadrant from a  small hematoma from previous biopsy. No other masses in either side. No axillary adenopathy.  Abdominal: Soft. Bowel sounds are normal. She exhibits no distension and no mass. There is no tenderness. There is no rebound and no guarding.  Multiple trocar incisions.  Musculoskeletal: She exhibits no edema and no tenderness.  Lymphadenopathy:    She has no cervical adenopathy.  Neurological: She is alert and oriented to person, place, and time. She exhibits normal muscle tone. Coordination normal.  Skin: Skin is warm. No rash  noted. She is not diaphoretic. No erythema. No pallor.  Psychiatric: She has a normal mood and affect. Her behavior is normal. Judgment and thought content normal.    Data Reviewed Imaging studies and pathology report  Assessment    Intraductal papilloma with microcalcifications right breast, central retroareolar area. Excision is indicated to rule out occult non-invasive cancer  Hypothyroidism  History cholecystectomy  History robotic hysterectomy  Family history breast cancer in second line relatives     Plan    I had a long discussion about her biopsy report, indications for biopsy, techniques of biopsy. She definitely wants this area excised to exclude cancer  We'll schedule for right partial mastectomy with needle localization in the near future  I discussed the indications, details, technique, and numerous risk of the surgery with her. She understands all these issues and all of her questions are answered. She agrees with this plan.        Angelia Mould. Derrell Lolling, M.D., [facility] General and Minimally invasive Surgery Breast and Colorectal Surgery Office:   [phone] Pager:  [phone]  05/01/2012, 10:24 AM

## 2012-05-01 NOTE — Progress Notes (Signed)
Takes hctz mostly for swelling and borderline htn-will do bmet-ekg

## 2012-05-01 NOTE — Patient Instructions (Signed)
Recent mammograms show a focal area of microcalcifications in the right breast deep to the nipple. Biopsy of this area shows fragments of intraductal papilloma.  There is a low but definite chance that you might have noninvasive breast cancer in this area. Excision of this area is recommended.  You will be scheduled for right lumpectomy with needle localization in the near future.     Lumpectomy, Breast Conserving Surgery A lumpectomy is breast surgery that removes only part of the breast. Another name used may be partial mastectomy. The amount removed varies. Make sure you understand how much of your breast will be removed. Reasons for a lumpectomy:  Any solid breast mass.  Grouped significant nodularity that may be confused with a solitary breast mass. Lumpectomy is the most common form of breast cancer surgery today. The surgeon removes the portion of your breast which contains the tumor (cancer). This is the lump. Some normal tissue around the lump is also removed to be sure that all the tumor has been removed.  If cancer cells are found in the margins where the breast tissue was removed, your surgeon will do more surgery to remove the remaining cancer tissue. This is called re-excision surgery. Radiation and/or chemotherapy treatments are often given following a lumpectomy to kill any cancer cells that could possibly remain.  REASONS YOU MAY NOT BE ABLE TO HAVE BREAST CONSERVING SURGERY:  The tumor is located in more than one place.  Your breast is small and the tumor is large so the breast would be disfigured.  The entire tumor removal is not successful with a lumpectomy.  You cannot commit to a full course of chemotherapy, radiation therapy or are pregnant and cannot have radiation.  You have previously had radiation to the breast to treat cancer. HOW A LUMPECTOMY IS PERFORMED If overnight nursing is not required following a biopsy, a lumpectomy can be performed as a same-day  surgery. This can be done in a hospital, clinic, or surgical center. The anesthesia used will depend on your surgeon. They will discuss this with you. A general anesthetic keeps you sleeping through the procedure. LET YOUR CAREGIVERS KNOW ABOUT THE FOLLOWING:  Allergies  Medications taken including herbs, eye drops, over the counter medications, and creams.  Use of steroids (by mouth or creams)  Previous problems with anesthetics or Novocaine.  Possibility of pregnancy, if this applies  History of blood clots (thrombophlebitis)  History of bleeding or blood problems.  Previous surgery  Other health problems BEFORE THE PROCEDURE You should be present one hour prior to your procedure unless directed otherwise.  AFTER THE PROCEDURE  After surgery, you will be taken to the recovery area where a nurse will watch and check your progress. Once you're awake, stable, and taking fluids well, barring other problems you will be allowed to go home.  Ice packs applied to your operative site may help with discomfort and keep the swelling down.  A small rubber drain may be placed in the breast for a couple of days to prevent a hematoma from developing in the breast.  A pressure dressing may be applied for 24 to 48 hours to prevent bleeding.  Keep the wound dry.  You may resume a normal diet and activities as directed. Avoid strenuous activities affecting the arm on the side of the biopsy site such as tennis, swimming, heavy lifting (more than 10 pounds) or pulling.  Bruising in the breast is normal following this procedure.  Wearing a bra -  even to bed - may be more comfortable and also help keep the dressing on.  Change dressings as directed.  Only take over-the-counter or prescription medicines for pain, discomfort, or fever as directed by your caregiver. Call for your results as instructed by your surgeon. Remember it is your responsibility to get the results of your lumpectomy if your  surgeon asked you to follow-up. Do not assume everything is fine if you have not heard from your caregiver. SEEK MEDICAL CARE IF:   There is increased bleeding (more than a small spot) from the wound.  You notice redness, swelling, or increasing pain in the wound.  Pus is coming from wound.  An unexplained oral temperature above 102 F (38.9 C) develops.  You notice a foul smell coming from the wound or dressing. SEEK IMMEDIATE MEDICAL CARE IF:   You develop a rash.  You have difficulty breathing.  You have any allergic problems. Document Released: 03/15/2006 Document Revised: 04/26/2011 Document Reviewed: 06/16/2006 [facility] Patient Information 2013 [place], [place].

## 2012-05-02 ENCOUNTER — Encounter (HOSPITAL_BASED_OUTPATIENT_CLINIC_OR_DEPARTMENT_OTHER)
Admission: RE | Admit: 2012-05-02 | Discharge: 2012-05-02 | Disposition: A | Discharge status: Home / Self Care | Payer: Managed Care, Other (non HMO) | Source: Ambulatory Visit | Attending: General Surgery | Admitting: General Surgery

## 2012-05-02 LAB — BASIC METABOLIC PANEL
CO2: 29 mEq/L (ref 19–32)
GFR calc non Af Amer: 70 mL/min — ABNORMAL LOW (ref >90)
Glucose, Bld: 86 mg/dL (ref 70–99)
Potassium: 3.9 mEq/L (ref 3.5–5.1)
Sodium: 139 mEq/L (ref 135–145)

## 2012-05-02 NOTE — H&P (Signed)
Kim Livingston   MRN:  [id number]   Description: 47 year old female  Provider: Ernestene Mention, MD  Department: [facility]       Diagnoses    Abnormal mammogram with microcalcification    -  Primary    793.81      Reason for Visit    New Evaluation    eval rt br papalloma        Current Vitals - Last Recorded    BP Pulse Temp(Src) Resp Ht Wt    132/70 80 98.1 F (36.7 C) (Temporal) 16 5' 4.5" (1.638 m) 189 lb (85.73 kg)    BMI - 31.95 kg/m2 03/07/2011               History and Physical   Ernestene Mention, MD   Status: Signed                         HPI Kim Livingston is a 47 y.o. female.  She is referred by Dr. Zella Richer for evaluation of abnormal mammogram of the right breast, papilloma, central retroareolar area.   This patient has not had any breast problems in the past. Recent screening mammogram showed some microcalcifications in the mid retroareolar area, a small focal area. There is also a cystlike structure in the right breast upper outer quadrant. Biopsy of both of these areas were performed. In the central breast there were fragments of papilloma. The upper outer quadrant there was benign fibrocystic disease with calcifications. She was referred for excision of the central papilloma.   Past history reveals hyperthyroidism which spontaneously became hypothyroidism.  Lao appy.. Laparoscopic cholecystectomy. Robotic hysterectomy.   Family History reveals breast cancer and 2 paternal cousins and maternal aunt had a cancerous disease and had bilateral mastectomies. No ovarian cancer.   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 Denies tobacco.         Past Medical History   Diagnosis  Date   .  Hypothyroidism     .  PONV (postoperative nausea and vomiting)     .  Abscess of peritoneum           Past Surgical History   Procedure  Laterality  Date   .  Appendectomy       .  Cervical back  fusion           Upper back.    .  Cesarean section       .  Cholecystectomy       .  Tonsillectomy       .  Abdominal hysterectomy       .  Adenoidectomy       .  Wisdom tooth extraction             Family History   Problem  Relation  Age of Onset   .  Lung cancer  Mother     .  Lupus  Mother     .  Cancer  Mother         lung   .  Heart disease  Father         4 vessel bypass, smoker   .  Heart failure  Father     .  Lung cancer  Maternal Grandmother     .  Stroke  Maternal Grandmother     .  Bone cancer  Paternal Grandfather     .  Cancer  Maternal Aunt          Social History History   Substance Use Topics   .  Smoking status:  Former Smoker       Quit date:  08/15/2001   .  Smokeless tobacco:  Not on file   .  Alcohol Use:  No         Allergies   Allergen  Reactions   .  Penicillins         REACTION: hives         Current Outpatient Prescriptions   Medication  Sig  Dispense  Refill   .  Azelaic Acid (FINACEA) 15 % cream  Apply 1 application topically 2 (two) times daily. After skin is thoroughly washed and patted dry, gently but thoroughly massage a thin film of azelaic acid cream into the affected area twice daily, in the morning and evening.         .  calcium carbonate (TUMS - DOSED IN MG ELEMENTAL CALCIUM) 500 MG chewable tablet  Chew 1 tablet by mouth at bedtime.         .  cyclobenzaprine (FLEXERIL) 10 MG tablet  Take 0.5-1 tablets (5-10 mg total) by mouth at bedtime as needed for muscle spasms.   20 tablet   0   .  hydrochlorothiazide (HYDRODIURIL) 25 MG tablet  Take 1 tablet (25 mg total) by mouth daily.   90 tablet   1   .  Omega-3 Fatty Acids 1200 MG CAPS  Take 2,400 mg by mouth 2 (two) times daily.         .  Pediatric Multiple Vit-C-FA (PEDIATRIC MULTIVITAMIN) chewable tablet  Chew 1 tablet by mouth daily.         Marland Kitchen  SYNTHROID 150 MCG tablet  TAKE 1 TABLET (150 MCG TOTAL) BY MOUTH DAILY. BRAND NAME SYNTHROID ONLY   30 tablet   5       No  current facility-administered medications for this visit.        Review of Systems   Constitutional: Negative for fever, chills and unexpected weight change.  HENT: Negative for hearing loss, congestion, sore throat, trouble swallowing and voice change.   Eyes: Negative for visual disturbance.  Respiratory: Negative for cough and wheezing.   Cardiovascular: Negative for chest pain, palpitations and leg swelling.  Gastrointestinal: Negative for nausea, vomiting, abdominal pain, diarrhea, constipation, blood in stool, abdominal distention and anal bleeding.  Genitourinary: Negative for hematuria, vaginal bleeding and difficulty urinating.  Musculoskeletal: Negative for arthralgias.  Skin: Negative for rash and wound.  Neurological: Negative for seizures, syncope and headaches.  Hematological: Negative for adenopathy. Does not bruise/bleed easily.  Psychiatric/Behavioral: Negative for confusion.      Blood pressure 132/70, pulse 80, temperature 98.1 F (36.7 C), temperature source Temporal, resp. rate 16, height 5' 4.5" (1.638 m), weight 189 lb (85.73 kg), last menstrual period 03/07/2011.   Physical Exam  Constitutional: She is oriented to person, place, and time. She appears well-developed and well-nourished. No distress.  HENT:   Head: Normocephalic and atraumatic.   Nose: Nose normal.   Mouth/Throat: No oropharyngeal exudate.  Eyes: Conjunctivae and EOM are normal. Pupils are equal, round, and reactive to light. Left eye exhibits no discharge. No scleral icterus.  Neck: Neck supple. No JVD present. No tracheal deviation present. No thyromegaly present.  Cardiovascular: Normal rate, regular rhythm, normal heart sounds and intact distal pulses.    No murmur heard. Pulmonary/Chest: Effort normal  and breath sounds normal. No respiratory distress. She has no wheezes. She has no rales. She exhibits no tenderness.  Breasts are large. Skin nipple and areolar are normal bilaterally.  In the right breast there may be a little bit of thickening in the upper outer quadrant from a  small hematoma from previous biopsy. No other masses in either side. No axillary adenopathy.  Abdominal: Soft. Bowel sounds are normal. She exhibits no distension and no mass. There is no tenderness. There is no rebound and no guarding.  Multiple trocar incisions.  Musculoskeletal: She exhibits no edema and no tenderness.  Lymphadenopathy:    She has no cervical adenopathy.  Neurological: She is alert and oriented to person, place, and time. She exhibits normal muscle tone. Coordination normal.  Skin: Skin is warm. No rash noted. She is not diaphoretic. No erythema. No pallor.  Psychiatric: She has a normal mood and affect. Her behavior is normal. Judgment and thought content normal.      Data Reviewed Imaging studies and pathology report   Assessment    Intraductal papilloma with microcalcifications right breast, central retroareolar area. Excision is indicated to rule out occult non-invasive cancer   Hypothyroidism   History cholecystectomy   History robotic hysterectomy   Family history breast cancer in second line relatives      Plan    I had a long discussion about her biopsy report, indications for biopsy, techniques of biopsy. She definitely wants this area excised to exclude cancer   We'll schedule for right partial mastectomy with needle localization in the near future   I discussed the indications, details, technique, and numerous risk of the surgery with her. She understands all these issues and all of her questions are answered. She agrees with this plan.           Angelia Mould. Derrell Lolling, M.D., [facility] General and Minimally invasive Surgery Breast and Colorectal Surgery Office:   [phone] Pager:   [phone]

## 2012-05-05 ENCOUNTER — Encounter (HOSPITAL_BASED_OUTPATIENT_CLINIC_OR_DEPARTMENT_OTHER)
Admission: RE | Disposition: A | Discharge status: Home / Self Care | Payer: Self-pay | Source: Ambulatory Visit | Attending: General Surgery

## 2012-05-05 ENCOUNTER — Encounter (HOSPITAL_BASED_OUTPATIENT_CLINIC_OR_DEPARTMENT_OTHER): Payer: Self-pay | Admitting: Anesthesiology

## 2012-05-05 ENCOUNTER — Ambulatory Visit (HOSPITAL_BASED_OUTPATIENT_CLINIC_OR_DEPARTMENT_OTHER): Payer: Managed Care, Other (non HMO) | Admitting: Anesthesiology

## 2012-05-05 ENCOUNTER — Ambulatory Visit (HOSPITAL_BASED_OUTPATIENT_CLINIC_OR_DEPARTMENT_OTHER)
Admission: RE | Admit: 2012-05-05 | Discharge: 2012-05-05 | Disposition: A | Discharge status: Home / Self Care | Payer: Managed Care, Other (non HMO) | Source: Ambulatory Visit | Attending: General Surgery | Admitting: General Surgery

## 2012-05-05 ENCOUNTER — Encounter (HOSPITAL_BASED_OUTPATIENT_CLINIC_OR_DEPARTMENT_OTHER): Payer: Self-pay | Admitting: *Deleted

## 2012-05-05 DIAGNOSIS — K219 Gastro-esophageal reflux disease without esophagitis: Secondary | ICD-10-CM | POA: Insufficient documentation

## 2012-05-05 DIAGNOSIS — D249 Benign neoplasm of unspecified breast: Secondary | ICD-10-CM

## 2012-05-05 DIAGNOSIS — Z803 Family history of malignant neoplasm of breast: Secondary | ICD-10-CM | POA: Insufficient documentation

## 2012-05-05 DIAGNOSIS — Z90710 Acquired absence of both cervix and uterus: Secondary | ICD-10-CM | POA: Insufficient documentation

## 2012-05-05 DIAGNOSIS — I10 Essential (primary) hypertension: Secondary | ICD-10-CM | POA: Insufficient documentation

## 2012-05-05 DIAGNOSIS — E039 Hypothyroidism, unspecified: Secondary | ICD-10-CM | POA: Insufficient documentation

## 2012-05-05 DIAGNOSIS — N6019 Diffuse cystic mastopathy of unspecified breast: Secondary | ICD-10-CM | POA: Insufficient documentation

## 2012-05-05 DIAGNOSIS — Z79899 Other long term (current) drug therapy: Secondary | ICD-10-CM | POA: Insufficient documentation

## 2012-05-05 DIAGNOSIS — Z87891 Personal history of nicotine dependence: Secondary | ICD-10-CM | POA: Insufficient documentation

## 2012-05-05 DIAGNOSIS — R920 Mammographic microcalcification found on diagnostic imaging of breast: Secondary | ICD-10-CM

## 2012-05-05 DIAGNOSIS — Z880 Allergy status to penicillin: Secondary | ICD-10-CM | POA: Insufficient documentation

## 2012-05-05 HISTORY — PX: BREAST LUMPECTOMY WITH NEEDLE LOCALIZATION: SHX5759

## 2012-05-05 HISTORY — DX: Presence of spectacles and contact lenses: Z97.3

## 2012-05-05 HISTORY — DX: Gastro-esophageal reflux disease without esophagitis: K21.9

## 2012-05-05 HISTORY — DX: Essential (primary) hypertension: I10

## 2012-05-05 HISTORY — DX: Edema, unspecified: R60.9

## 2012-05-05 SURGERY — BREAST LUMPECTOMY WITH NEEDLE LOCALIZATION
Anesthesia: General | Site: Breast | Laterality: Right | Wound class: Clean

## 2012-05-05 MED ORDER — SODIUM CHLORIDE 0.9 % IV SOLN: 250.0000 mL | INTRAVENOUS | Status: DC | PRN

## 2012-05-05 MED ORDER — OXYCODONE HCL 5 MG PO TABS: 5.0000 mg | ORAL_TABLET | Freq: Once | ORAL | Status: DC | PRN

## 2012-05-05 MED ORDER — SODIUM CHLORIDE 0.9 % IV SOLN: INTRAVENOUS | Status: DC

## 2012-05-05 MED ORDER — OXYCODONE HCL 5 MG PO TABS: 5.0000 mg | ORAL_TABLET | ORAL | Status: DC | PRN

## 2012-05-05 MED ORDER — MORPHINE SULFATE 2 MG/ML IJ SOLN: 2.0000 mg | INTRAMUSCULAR | Status: DC | PRN

## 2012-05-05 MED ORDER — PROPOFOL 10 MG/ML IV BOLUS
INTRAVENOUS | Status: DC | PRN
  Administered 2012-05-05: 250 mg via INTRAVENOUS

## 2012-05-05 MED ORDER — ACETAMINOPHEN 325 MG PO TABS: 650.0000 mg | ORAL_TABLET | ORAL | Status: DC | PRN

## 2012-05-05 MED ORDER — SODIUM CHLORIDE 0.9 % IJ SOLN: 3.0000 mL | INTRAMUSCULAR | Status: DC | PRN

## 2012-05-05 MED ORDER — LACTATED RINGERS IV SOLN
INTRAVENOUS | Status: DC
  Administered 2012-05-05: 11:00:00 via INTRAVENOUS

## 2012-05-05 MED ORDER — ONDANSETRON HCL 4 MG/2ML IJ SOLN: 4.0000 mg | Freq: Once | INTRAMUSCULAR | Status: DC | PRN

## 2012-05-05 MED ORDER — BUPIVACAINE-EPINEPHRINE 0.5% -1:200000 IJ SOLN
INTRAMUSCULAR | Status: DC | PRN
  Administered 2012-05-05: 15 mL

## 2012-05-05 MED ORDER — FENTANYL CITRATE 0.05 MG/ML IJ SOLN
INTRAMUSCULAR | Status: DC | PRN
  Administered 2012-05-05: 100 ug via INTRAVENOUS

## 2012-05-05 MED ORDER — DEXAMETHASONE SODIUM PHOSPHATE 4 MG/ML IJ SOLN
INTRAMUSCULAR | Status: DC | PRN
  Administered 2012-05-05: 10 mg via INTRAVENOUS

## 2012-05-05 MED ORDER — HYDROCODONE-ACETAMINOPHEN 5-325 MG PO TABS: 1.0000 | ORAL_TABLET | ORAL | Status: DC | PRN

## 2012-05-05 MED ORDER — CHLORHEXIDINE GLUCONATE 4 % EX LIQD: 1.0000 "application " | Freq: Once | CUTANEOUS | Status: DC

## 2012-05-05 MED ORDER — VANCOMYCIN HCL IN DEXTROSE 1-5 GM/200ML-% IV SOLN
1000.0000 mg | INTRAVENOUS | Status: AC
  Administered 2012-05-05: 1000 mg via INTRAVENOUS

## 2012-05-05 MED ORDER — OXYCODONE HCL 5 MG/5ML PO SOLN: 5.0000 mg | Freq: Once | ORAL | Status: DC | PRN

## 2012-05-05 MED ORDER — LIDOCAINE HCL (CARDIAC) 20 MG/ML IV SOLN
INTRAVENOUS | Status: DC | PRN
  Administered 2012-05-05: 100 mg via INTRAVENOUS

## 2012-05-05 MED ORDER — HYDROMORPHONE HCL PF 1 MG/ML IJ SOLN
0.2500 mg | INTRAMUSCULAR | Status: DC | PRN
  Administered 2012-05-05: 0.5 mg via INTRAVENOUS
  Administered 2012-05-05: 13:00:00 via INTRAVENOUS

## 2012-05-05 MED ORDER — ONDANSETRON HCL 4 MG/2ML IJ SOLN
INTRAMUSCULAR | Status: DC | PRN
  Administered 2012-05-05: 4 mg via INTRAVENOUS

## 2012-05-05 MED ORDER — SCOPOLAMINE 1 MG/3DAYS TD PT72
1.0000 | MEDICATED_PATCH | TRANSDERMAL | Status: DC
  Administered 2012-05-05: 1.5 mg via TRANSDERMAL

## 2012-05-05 MED ORDER — KETOROLAC TROMETHAMINE 30 MG/ML IJ SOLN
INTRAMUSCULAR | Status: DC | PRN
  Administered 2012-05-05: 30 mg via INTRAVENOUS

## 2012-05-05 MED ORDER — ACETAMINOPHEN 650 MG RE SUPP: 650.0000 mg | RECTAL | Status: DC | PRN

## 2012-05-05 MED ORDER — ONDANSETRON HCL 4 MG/2ML IJ SOLN: 4.0000 mg | Freq: Four times a day (QID) | INTRAMUSCULAR | Status: DC | PRN

## 2012-05-05 MED ORDER — SODIUM CHLORIDE 0.9 % IJ SOLN: 3.0000 mL | Freq: Two times a day (BID) | INTRAMUSCULAR | Status: DC

## 2012-05-05 SURGICAL SUPPLY — 59 items
ADH SKN CLS APL DERMABOND .7 (GAUZE/BANDAGES/DRESSINGS) ×1
APL SKNCLS STERI-STRIP NONHPOA (GAUZE/BANDAGES/DRESSINGS)
APPLIER CLIP 11 MED OPEN (CLIP)
APR CLP MED 11 20 MLT OPN (CLIP)
BANDAGE ELASTIC 6 VELCRO ST LF (GAUZE/BANDAGES/DRESSINGS) IMPLANT
BENZOIN TINCTURE PRP APPL 2/3 (GAUZE/BANDAGES/DRESSINGS) IMPLANT
BLADE SURG 10 STRL SS (BLADE) IMPLANT
BLADE SURG 15 STRL LF DISP TIS (BLADE) ×1 IMPLANT
BLADE SURG 15 STRL SS (BLADE) ×2
CANISTER SUCTION 1200CC (MISCELLANEOUS) ×2 IMPLANT
CHLORAPREP W/TINT 26ML (MISCELLANEOUS) ×2 IMPLANT
CLIP APPLIE 11 MED OPEN (CLIP) IMPLANT
CLOTH BEACON ORANGE TIMEOUT ST (SAFETY) ×2 IMPLANT
COVER MAYO STAND STRL (DRAPES) ×2 IMPLANT
COVER TABLE BACK 60X90 (DRAPES) ×2 IMPLANT
DECANTER SPIKE VIAL GLASS SM (MISCELLANEOUS) IMPLANT
DERMABOND ADVANCED (GAUZE/BANDAGES/DRESSINGS) ×1
DERMABOND ADVANCED .7 DNX12 (GAUZE/BANDAGES/DRESSINGS) IMPLANT
DEVICE DUBIN W/COMP PLATE 8390 (MISCELLANEOUS) ×2 IMPLANT
DRAIN CHANNEL 19F RND (DRAIN) IMPLANT
DRAIN HEMOVAC 1/8 X 5 (WOUND CARE) IMPLANT
DRAPE LAPAROSCOPIC ABDOMINAL (DRAPES) ×2 IMPLANT
DRAPE UTILITY XL STRL (DRAPES) ×2 IMPLANT
DRSG PAD ABDOMINAL 8X10 ST (GAUZE/BANDAGES/DRESSINGS) IMPLANT
ELECT REM PT RETURN 9FT ADLT (ELECTROSURGICAL) ×2
ELECTRODE REM PT RTRN 9FT ADLT (ELECTROSURGICAL) ×1 IMPLANT
EVACUATOR SILICONE 100CC (DRAIN) IMPLANT
GAUZE SPONGE 4X4 12PLY STRL LF (GAUZE/BANDAGES/DRESSINGS) IMPLANT
GAUZE SPONGE 4X4 16PLY XRAY LF (GAUZE/BANDAGES/DRESSINGS) IMPLANT
GLOVE BIO SURGEON STRL SZ 6.5 (GLOVE) ×2 IMPLANT
GLOVE EUDERMIC 7 POWDERFREE (GLOVE) ×2 IMPLANT
GOWN BRE IMP SLV AUR XL STRL (GOWN DISPOSABLE) ×2 IMPLANT
GOWN PREVENTION PLUS XLARGE (GOWN DISPOSABLE) ×2 IMPLANT
GOWN PREVENTION PLUS XXLARGE (GOWN DISPOSABLE) ×2 IMPLANT
KIT MARKER MARGIN INK (KITS) ×2 IMPLANT
NEEDLE HYPO 25X1 1.5 SAFETY (NEEDLE) ×2 IMPLANT
NS IRRIG 1000ML POUR BTL (IV SOLUTION) ×2 IMPLANT
PACK BASIN DAY SURGERY FS (CUSTOM PROCEDURE TRAY) ×2 IMPLANT
PAD ALCOHOL SWAB (MISCELLANEOUS) IMPLANT
PENCIL BUTTON HOLSTER BLD 10FT (ELECTRODE) ×2 IMPLANT
PIN SAFETY STERILE (MISCELLANEOUS) IMPLANT
SLEEVE SCD COMPRESS KNEE MED (MISCELLANEOUS) ×2 IMPLANT
SPONGE GAUZE 4X4 12PLY (GAUZE/BANDAGES/DRESSINGS) ×1 IMPLANT
SPONGE LAP 18X18 X RAY DECT (DISPOSABLE) IMPLANT
SPONGE LAP 4X18 X RAY DECT (DISPOSABLE) ×4 IMPLANT
STRIP CLOSURE SKIN 1/2X4 (GAUZE/BANDAGES/DRESSINGS) IMPLANT
SUT ETHILON 3 0 FSL (SUTURE) IMPLANT
SUT MNCRL AB 4-0 PS2 18 (SUTURE) ×2 IMPLANT
SUT SILK 2 0 SH (SUTURE) ×2 IMPLANT
SUT VIC AB 2-0 CT1 27 (SUTURE)
SUT VIC AB 2-0 CT1 TAPERPNT 27 (SUTURE) IMPLANT
SUT VIC AB 3-0 SH 27 (SUTURE)
SUT VIC AB 3-0 SH 27X BRD (SUTURE) IMPLANT
SUT VICRYL 3-0 CR8 SH (SUTURE) ×2 IMPLANT
SYR CONTROL 10ML LL (SYRINGE) ×2 IMPLANT
TOWEL OR 17X24 6PK STRL BLUE (TOWEL DISPOSABLE) ×3 IMPLANT
TOWEL OR NON WOVEN STRL DISP B (DISPOSABLE) IMPLANT
TUBE CONNECTING 20X1/4 (TUBING) ×2 IMPLANT
YANKAUER SUCT BULB TIP NO VENT (SUCTIONS) ×2 IMPLANT

## 2012-05-05 NOTE — Anesthesia Procedure Notes (Signed)
Procedure Name: LMA Insertion Date/Time: 05/05/2012 11:38 AM Performed by: Gar Gibbon Pre-anesthesia Checklist: Patient identified, Emergency Drugs available, Suction available and Patient being monitored Patient Re-evaluated:Patient Re-evaluated prior to inductionOxygen Delivery Method: Circle System Utilized Preoxygenation: Pre-oxygenation with 100% oxygen Intubation Type: IV induction Ventilation: Mask ventilation without difficulty LMA: LMA inserted LMA Size: 4.0 Number of attempts: 1 Airway Equipment and Method: bite block Placement Confirmation: positive ETCO2 Tube secured with: Tape Dental Injury: Teeth and Oropharynx as per pre-operative assessment

## 2012-05-05 NOTE — Interval H&P Note (Signed)
History and Physical Interval Note:  05/05/2012 11:26 AM  Kim Livingston  has presented today for surgery, with the diagnosis of abnormal mammogram right breast  The goals and the various methods of treatment have been discussed with the patient and family. After consideration of risks, benefits and other options for treatment, the patient has consented to  Procedure(s): BREAST LUMPECTOMY WITH NEEDLE LOCALIZATION (Right) as a surgical intervention .  The patient's history has been reviewed, patient examined today , no change in status, stable for surgery.  I have reviewed the patient's chart and labs.  Questions were answered to the patient's satisfaction.     Ernestene Mention

## 2012-05-05 NOTE — Anesthesia Postprocedure Evaluation (Signed)
  Anesthesia Post-op Note  Patient: Kim Livingston  Procedure(s) Performed: Procedure(s): BREAST LUMPECTOMY WITH NEEDLE LOCALIZATION (Right)  Patient Location: PACU  Anesthesia Type:General  Level of Consciousness: awake, alert  and oriented  Airway and Oxygen Therapy: Patient Spontanous Breathing  Post-op Pain: mild  Post-op Assessment: Post-op Vital signs reviewed  Post-op Vital Signs: Reviewed  Complications: No apparent anesthesia complications

## 2012-05-05 NOTE — Anesthesia Preprocedure Evaluation (Signed)
Anesthesia Evaluation  Patient identified by MRN, date of birth, ID band Patient awake    Reviewed: Allergy & Precautions, H&P , NPO status , Patient's Chart, lab work & pertinent test results  History of Anesthesia Complications (+) PONV  Airway Mallampati: I TM Distance: >3 FB Neck ROM: Full    Dental  (+) Teeth Intact and Dental Advisory Given   Pulmonary  breath sounds clear to auscultation        Cardiovascular hypertension, Pt. on medications Rhythm:Regular Rate:Normal     Neuro/Psych    GI/Hepatic GERD-  Medicated and Controlled,  Endo/Other    Renal/GU      Musculoskeletal   Abdominal   Peds  Hematology   Anesthesia Other Findings   Reproductive/Obstetrics                           Anesthesia Physical Anesthesia Plan  ASA: II  Anesthesia Plan: General   Post-op Pain Management:    Induction: Intravenous  Airway Management Planned: LMA  Additional Equipment:   Intra-op Plan:   Post-operative Plan: Extubation in OR  Informed Consent: I have reviewed the patients History and Physical, chart, labs and discussed the procedure including the risks, benefits and alternatives for the proposed anesthesia with the patient or authorized representative who has indicated his/her understanding and acceptance.     Plan Discussed with: CRNA, Anesthesiologist and Surgeon  Anesthesia Plan Comments:         Anesthesia Quick Evaluation

## 2012-05-05 NOTE — Transfer of Care (Signed)
Immediate Anesthesia Transfer of Care Note  Patient: Kim Livingston  Procedure(s) Performed: Procedure(s): BREAST LUMPECTOMY WITH NEEDLE LOCALIZATION (Right)  Patient Location: PACU  Anesthesia Type:General  Level of Consciousness: sedated and patient cooperative  Airway & Oxygen Therapy: Patient Spontanous Breathing and Patient connected to face mask oxygen  Post-op Assessment: Report given to PACU RN and Post -op Vital signs reviewed and stable  Post vital signs: Reviewed and stable  Complications: No apparent anesthesia complications

## 2012-05-05 NOTE — Op Note (Signed)
Patient Name:           Kim Livingston   Date of Surgery:        05/05/2012  Pre op Diagnosis:      Intraductal papilloma with microcalcifications, right breast, deep central retroareolar area  Post op Diagnosis:    Same  Procedure:                 Right partial mastectomy with needle localization and margin assessment  Surgeon:                     Angelia Mould. Derrell Lolling, M.D., FACS  Assistant:                      None  Operative Indications:    Kim Livingston is a 47 y.o. female. She is referred by Dr. Jeralyn Ruths  for evaluation of abnormal mammogram of the right breast, papilloma, central retroareolar area.  This patient has not had any breast problems in the past. Recent screening mammogram showed some microcalcifications in the deep,  mid retroareolar area, a small focal area. There is also a cystlike structure in the right breast upper outer quadrant. Biopsy of both of these areas were performed. In the central breast there were fragments of papilloma. The upper outer quadrant there was benign fibrocystic disease with calcifications. She was referred for excision of the central papilloma.   Lap appy.. Laparoscopic cholecystectomy. Robotic hysterectomy.  Family History reveals breast cancer and 2 paternal cousins and maternal aunt had a cancerous disease and had bilateral mastectomies. No ovarian cancer  Operative Findings:       The patient's breasts are very large. The wire entered the right breast from the far lateral position and was directed medially into the deep retroareolar area. The specimen mammogram showed the density, the marker clip, and the wire contained within the specimen. Dr. Derinda Late in radiology felt that this was appropriately excised.  Procedure in Detail:          Following the wire localization the patient was brought to [facility], taken to the operating room, and placed under general anesthesia. Surgical time out was performed. The right breast was  prepped and draped in a sterile fashion. Intravenous antibiotics were given. 0.5% Marcaine with epinephrine was used as a local infiltration anesthetic. Because the wire was inserted so far laterally I chose  to come much more medially and made a circumareolar incision laterally. Dissection was carried down into the breast tissue and around the localizing wire. The specimen was removed and marked with  silk sutures to orient the pathologist. The specimen mammogram showed the wire, the marker clip, and the density in question had been completely removed and relatively central in the specimen. The specimen was sent to pathology. Hemostasis was excellent and achieved with electrocautery. The wound was irrigated with saline. The breast tissues were closed in layers with interrupted sutures of 3-0 Vicryl. The skin was closed with a running subcuticular suture of 4-0 Monocryl and Dermabond. The patient tolerated the procedure well and was taken to recovery in stable condition. EBL 15 cc. Counts correct. No complications.     Angelia Mould. Derrell Lolling, M.D., FACS General and Minimally Invasive Surgery Breast and Colorectal Surgery  05/05/2012 12:38 PM

## 2012-05-08 ENCOUNTER — Encounter: Payer: Self-pay | Admitting: Family Medicine

## 2012-05-08 ENCOUNTER — Encounter (HOSPITAL_BASED_OUTPATIENT_CLINIC_OR_DEPARTMENT_OTHER): Payer: Self-pay | Admitting: General Surgery

## 2012-05-09 ENCOUNTER — Telehealth (INDEPENDENT_AMBULATORY_CARE_PROVIDER_SITE_OTHER): Payer: Self-pay

## 2012-05-09 NOTE — Progress Notes (Signed)
Quick Note:  Inform patient of Pathology report,. Tell her that this is an intraductal papilloma, which is completely benign. No evidence of malignancy. Obviously good news. ______

## 2012-05-09 NOTE — Progress Notes (Signed)
Quick Note:  Inform patient of Pathology report,.Tell her that this showed a benign papilloma. Obviously good news. Will discuss further at next OV. ______

## 2012-05-09 NOTE — Telephone Encounter (Signed)
Message copied by Ivory Broad on Tue May 09, 2012  9:52 AM ------      Message from: Ernestene Mention      Created: Tue May 09, 2012  6:26 AM       Inform patient of Pathology report,. Tell her that this is an intraductal papilloma, which is completely benign. No evidence of  malignancy. Obviously good news. ------

## 2012-05-09 NOTE — Telephone Encounter (Signed)
I called and notified the pt of her results. 

## 2012-05-15 ENCOUNTER — Encounter (INDEPENDENT_AMBULATORY_CARE_PROVIDER_SITE_OTHER): Payer: Self-pay

## 2012-05-17 ENCOUNTER — Encounter (INDEPENDENT_AMBULATORY_CARE_PROVIDER_SITE_OTHER): Payer: Self-pay

## 2012-05-25 ENCOUNTER — Encounter (INDEPENDENT_AMBULATORY_CARE_PROVIDER_SITE_OTHER): Payer: Self-pay

## 2012-05-29 ENCOUNTER — Encounter (INDEPENDENT_AMBULATORY_CARE_PROVIDER_SITE_OTHER): Payer: Self-pay | Admitting: General Surgery

## 2012-05-29 ENCOUNTER — Ambulatory Visit (INDEPENDENT_AMBULATORY_CARE_PROVIDER_SITE_OTHER): Payer: Managed Care, Other (non HMO) | Admitting: General Surgery

## 2012-05-29 VITALS — BP 114/62 | HR 68 | Temp 97.8°F | Resp 16 | Ht 64.0 in | Wt 186.0 lb

## 2012-05-29 DIAGNOSIS — D241 Benign neoplasm of right breast: Secondary | ICD-10-CM

## 2012-05-29 DIAGNOSIS — D249 Benign neoplasm of unspecified breast: Secondary | ICD-10-CM | POA: Insufficient documentation

## 2012-05-29 NOTE — Patient Instructions (Signed)
Your final pathology report shows a benign intraductal papilloma. You had been given a copy of the pathology report.  You appear to be healing well from the surgery without any obvious surgical complications.  Because of the family history of breast cancer in second line relatives, and the presence of the intraductal papilloma, you might be statistically at very slight increased risk for breast cancer. Be sure to get annual breast exam and annual mammograms.  Return to see Dr. Derrell Lolling If further problems arise.

## 2012-05-29 NOTE — Progress Notes (Signed)
Patient ID: Kim Livingston, female   DOB: 1965/08/31, 47 y.o.   MRN: [id number] History: This patient underwent right partial mastectomy with needle localization on 05/05/2012. I removed an area of microcalcifications in the deep retroareolar area. Final pathology report shows a benign intraductal papilloma. She has had no problems with wound healing. We further discussed the fact she has  several second line relatives with breast cancer.  Exam: Patient looks well. Good spirits. I gave her a copy of the pathology report. Right breast is large. Healing well. Circumareolar incision looks good. New fluid collection, hematoma, or infection  Assessment: Intraductal papilloma right breast, recovering uneventfully following excisional biopsy  Plan: I told her to be sure to get annual mammograms and annual breast exam which she states she will do this on return to see me if further problems arise.    Angelia Mould. Derrell Lolling, M.D., [facility] General and Minimally invasive Surgery Breast and Colorectal Surgery Office:   [phone] Pager:   [phone]

## 2012-06-05 ENCOUNTER — Encounter: Payer: Self-pay | Admitting: Family Medicine

## 2012-06-05 ENCOUNTER — Ambulatory Visit (INDEPENDENT_AMBULATORY_CARE_PROVIDER_SITE_OTHER): Payer: Managed Care, Other (non HMO) | Admitting: Family Medicine

## 2012-06-05 VITALS — BP 106/65 | HR 80 | Wt 185.0 lb

## 2012-06-05 DIAGNOSIS — L237 Allergic contact dermatitis due to plants, except food: Secondary | ICD-10-CM

## 2012-06-05 DIAGNOSIS — L255 Unspecified contact dermatitis due to plants, except food: Secondary | ICD-10-CM

## 2012-06-05 MED ORDER — PREDNISONE 20 MG PO TABS: ORAL_TABLET | ORAL | Status: AC

## 2012-06-05 MED ORDER — METHYLPREDNISOLONE SODIUM SUCC 125 MG IJ SOLR
125.0000 mg | Freq: Once | INTRAMUSCULAR | Status: AC
  Administered 2012-06-05: 125 mg via INTRAMUSCULAR

## 2012-06-05 NOTE — Progress Notes (Signed)
And CC: Kim Livingston is a 47 y.o. female is here for Poison Ivy   Subjective: HPI:  Patient complains of rash and itching at the site of the rash. Symptoms have come on slowly over the past 2 days and worsening on a daily basis. Localized to forearms and elbows. Itching is described as moderate in severity. No improvement with calamine lotion, mild improvement with itching using Benadryl. Patient reports known exposure to poison ivy and has had a troubled history with this in the past. She denies fevers, chills, flushing, rapid heartbeat, shortness of breath, abdominal pain, nor diarrhea   Review Of Systems Outlined In HPI  Past Medical History  Diagnosis Date  . Hypothyroidism   . Abscess of peritoneum   . Hypertension     borderline  . GERD (gastroesophageal reflux disease)     hx  . Wears glasses   . PONV (postoperative nausea and vomiting)     used scop patch-did well  . Edema      Family History  Problem Relation Age of Onset  . Lung cancer Mother   . Lupus Mother   . Cancer Mother     lung  . Heart disease Father     4 vessel bypass, smoker  . Heart failure Father   . Lung cancer Maternal Grandmother   . Stroke Maternal Grandmother   . Bone cancer Paternal Grandfather   . Cancer Maternal Aunt      History  Substance Use Topics  . Smoking status: Former Smoker    Quit date: 08/15/2001  . Smokeless tobacco: Not on file  . Alcohol Use: No     Objective: Filed Vitals:   06/05/12 1510  BP: 106/65  Pulse: 80    Vital signs reviewed. General: Alert and Oriented, No Acute Distress HEENT: Pupils equal, round, reactive to light. Conjunctivae clear.  External ears unremarkable.  Moist mucous membranes. Lungs: Clear and comfortable work of breathing, speaking in full sentences without accessory muscle use. Cardiac: Regular rate and rhythm.  Neuro: CN II-XII grossly intact, gait normal. Extremities: No peripheral edema.  Strong peripheral pulses.  Mental  Status: No depression, anxiety, nor agitation. Logical though process. Skin: Warm and dry. Linear vesicular streaks on the forearms on a bed of erythema.  Assessment & Plan: [name] was seen today for poison ivy.  Diagnoses and associated orders for this visit:  Poison ivy - predniSONE (DELTASONE) 20 MG tablet; Three tabs daily days 1-3, two tabs daily days 4-6, one tab daily days 7-9, half tab daily days 10-13.    Poison ivy eruption: Start prednisone taper and 125 mg Solu-Medrol here today. Discussed anti-itch oral and topical therapies available over-the-counter.   Return if symptoms worsen or fail to improve.

## 2012-09-04 ENCOUNTER — Other Ambulatory Visit: Payer: Self-pay | Admitting: Family Medicine

## 2012-10-19 ENCOUNTER — Encounter: Payer: Managed Care, Other (non HMO) | Admitting: Family Medicine

## 2012-10-20 ENCOUNTER — Ambulatory Visit (INDEPENDENT_AMBULATORY_CARE_PROVIDER_SITE_OTHER): Payer: Managed Care, Other (non HMO) | Admitting: Family Medicine

## 2012-10-20 ENCOUNTER — Encounter: Payer: Self-pay | Admitting: Family Medicine

## 2012-10-20 ENCOUNTER — Other Ambulatory Visit (HOSPITAL_COMMUNITY)
Admission: RE | Admit: 2012-10-20 | Discharge: 2012-10-20 | Disposition: A | Discharge status: Home / Self Care | Payer: Managed Care, Other (non HMO) | Source: Ambulatory Visit | Attending: Family Medicine | Admitting: Family Medicine

## 2012-10-20 VITALS — BP 121/72 | HR 79 | Ht 64.0 in | Wt 186.0 lb

## 2012-10-20 DIAGNOSIS — Z0000 Encounter for general adult medical examination without abnormal findings: Secondary | ICD-10-CM

## 2012-10-20 DIAGNOSIS — Z23 Encounter for immunization: Secondary | ICD-10-CM

## 2012-10-20 DIAGNOSIS — Z124 Encounter for screening for malignant neoplasm of cervix: Secondary | ICD-10-CM

## 2012-10-20 DIAGNOSIS — E039 Hypothyroidism, unspecified: Secondary | ICD-10-CM

## 2012-10-20 DIAGNOSIS — Z01419 Encounter for gynecological examination (general) (routine) without abnormal findings: Secondary | ICD-10-CM | POA: Insufficient documentation

## 2012-10-20 DIAGNOSIS — Z1151 Encounter for screening for human papillomavirus (HPV): Secondary | ICD-10-CM | POA: Insufficient documentation

## 2012-10-20 NOTE — Progress Notes (Signed)
  Subjective:     Kim Livingston is a 47 y.o. female and is here for a comprehensive physical exam. The patient reports no problems. Had a hard time getting back in hwer hter gyn for her pap. Would like me to do it today. No vagainal concerns. Hx fo hysterectomy.  Had recent breast bospies, but says doing well.    History   Social History  . Marital Status: Married    Spouse Name: Reita Cliche    Number of Children: 1  . Years of Education: N/A   Occupational History  . MANAGER     Social History Main Topics  . Smoking status: Former Smoker    Quit date: 08/15/2001  . Smokeless tobacco: Not on file  . Alcohol Use: No  . Drug Use: No  . Sexual Activity: Yes    Partners: Male   Other Topics Concern  . Not on file   Social History Narrative   Some exercise.  2 cup caffeine daily.    Health Maintenance  Topic Date Due  . Influenza Vaccine  09/15/2013  . Pap Smear  10/21/2015  . Tetanus/tdap  09/11/2019    The following portions of the patient's history were reviewed and updated as appropriate: allergies, current medications, past family history, past medical history, past social history, past surgical history and problem list.  Review of Systems Comprehensive ROS is neg.   Objective:    BP 121/72  Pulse 79  Ht 5\' 4"  (1.626 m)  Wt 186 lb (84.369 kg)  BMI 31.91 kg/m2  LMP 03/07/2011 General appearance: alert, cooperative and appears stated age Head: Normocephalic, without obvious abnormality, atraumatic Eyes: conj clear, EOMi, PEERLA Ears: normal TM's and external ear canals both ears Nose: Nares normal. Septum midline. Mucosa normal. No drainage or sinus tenderness. Throat: lips, mucosa, and tongue normal; teeth and gums normal Neck: no adenopathy, no carotid bruit, no JVD, supple, symmetrical, trachea midline and thyroid not enlarged, symmetric, no tenderness/mass/nodules Back: symmetric, no curvature. ROM normal. No CVA tenderness. Lungs: clear to auscultation  bilaterally Heart: regular rate and rhythm, S1, S2 normal, no murmur, click, rub or gallop Abdomen: soft, non-tender; bowel sounds normal; no masses,  no organomegaly Pelvic: cervix normal in appearance, external genitalia normal, no adnexal masses or tenderness, no cervical motion tenderness, rectovaginal septum normal, uterus normal size, shape, and consistency and vagina normal without discharge Extremities: extremities normal, atraumatic, no cyanosis or edema Pulses: 2+ and symmetric Skin: Skin color, texture, turgor normal. No rashes or lesions Lymph nodes: Cervical, supraclavicular, and axillary nodes normal. Neurologic: Alert and oriented X 3, normal strength and tone. Normal symmetric reflexes. Normal coordination and gait  .   Assessment:    Healthy female exam.    Plan:     See After Visit Summary for Counseling Recommendations   Keep up a regular exercise program and make sure you are eating a healthy diet Try to eat 4 servings of dairy a day, or if you are lactose intolerant take a calcium with vitamin D daily.  Your vaccines are up to date.  Pap performed. Will with results.              Hypthyroid - due to recheck TSH.

## 2012-10-20 NOTE — Patient Instructions (Addendum)
Keep up a regular exercise program and make sure you are eating a healthy diet Try to eat 4 servings of dairy a day, or if you are lactose intolerant take a calcium with vitamin D daily.  Your vaccines are up to date.   

## 2012-10-21 ENCOUNTER — Encounter: Payer: Self-pay | Admitting: Family Medicine

## 2012-10-27 LAB — COMPLETE METABOLIC PANEL WITH GFR
ALT: 35 U/L (ref 0–35)
AST: 26 U/L (ref 0–37)
Albumin: 4.3 g/dL (ref 3.5–5.2)
Alkaline Phosphatase: 46 U/L (ref 39–117)
GFR, Est Non African American: 85 mL/min
Glucose, Bld: 89 mg/dL (ref 70–99)
Potassium: 3.5 mEq/L (ref 3.5–5.3)
Sodium: 137 mEq/L (ref 135–145)
Total Protein: 7 g/dL (ref 6.0–8.3)

## 2012-10-27 LAB — LIPID PANEL
LDL Cholesterol: 104 mg/dL — ABNORMAL HIGH (ref 0–99)
Total CHOL/HDL Ratio: 4 Ratio
VLDL: 35 mg/dL (ref 0–40)

## 2012-11-01 ENCOUNTER — Other Ambulatory Visit: Payer: Self-pay | Admitting: Family Medicine

## 2012-11-13 ENCOUNTER — Telehealth: Payer: Self-pay | Admitting: *Deleted

## 2012-11-13 NOTE — Telephone Encounter (Signed)
I already released them and wrote her a note over my chart on 10/29/12.  Has she checked her account?  Do we need to re-release them?  See note at the bottom of the lab result to give her verbal read over the phone.

## 2012-11-13 NOTE — Telephone Encounter (Signed)
Pt called wanting to know her results. Looked back in her chart to see if there were any notes regarding her results and I didn't see anything however, I did not want to release unless it was ok to do so. Please advise.Kim Livingston

## 2012-11-15 NOTE — Telephone Encounter (Signed)
Pt informed and reminded that these results can be viewed in my chart at her convenience.Kim Livingston

## 2013-03-21 ENCOUNTER — Other Ambulatory Visit: Payer: Self-pay | Admitting: Family Medicine

## 2013-03-27 ENCOUNTER — Other Ambulatory Visit: Payer: Self-pay | Admitting: Family Medicine

## 2013-05-01 ENCOUNTER — Encounter: Payer: Self-pay | Admitting: *Deleted

## 2013-08-11 ENCOUNTER — Other Ambulatory Visit: Payer: Self-pay | Admitting: Family Medicine

## 2013-08-25 ENCOUNTER — Other Ambulatory Visit: Payer: Self-pay | Admitting: Family Medicine

## 2013-10-23 ENCOUNTER — Encounter: Payer: Self-pay | Admitting: Family Medicine

## 2013-10-23 ENCOUNTER — Ambulatory Visit (INDEPENDENT_AMBULATORY_CARE_PROVIDER_SITE_OTHER): Payer: 59 | Admitting: Family Medicine

## 2013-10-23 VITALS — BP 118/75 | HR 72 | Ht 64.0 in | Wt 195.0 lb

## 2013-10-23 DIAGNOSIS — Z0000 Encounter for general adult medical examination without abnormal findings: Secondary | ICD-10-CM

## 2013-10-23 DIAGNOSIS — Z23 Encounter for immunization: Secondary | ICD-10-CM

## 2013-10-23 NOTE — Patient Instructions (Signed)
Keep up a regular exercise program and make sure you are eating a healthy diet Try to eat 4 servings of dairy a day, or if you are lactose intolerant take a calcium with vitamin D daily.  Your vaccines are up to date.   

## 2013-10-23 NOTE — Progress Notes (Signed)
  Subjective:     Kim Livingston is a 48 y.o. female and is here for a comprehensive physical exam. The patient reports no problems.  History   Social History  . Marital Status: Married    Spouse Name: Mortimer Fries    Number of Children: 1  . Years of Education: N/A   Occupational History  . MANAGER     Social History Main Topics  . Smoking status: Former Smoker    Quit date: 08/15/2001  . Smokeless tobacco: Not on file  . Alcohol Use: No  . Drug Use: No  . Sexual Activity: Yes    Partners: Male   Other Topics Concern  . Not on file   Social History Narrative   Some exercise.  2 cup caffeine daily.    Health Maintenance  Topic Date Due  . Influenza Vaccine  09/15/2013  . Pap Smear  10/21/2015  . Tetanus/tdap  09/11/2019    The following portions of the patient's history were reviewed and updated as appropriate: allergies, current medications, past family history, past medical history, past social history, past surgical history and problem list.  Review of Systems A comprehensive review of systems was negative.   Objective:    BP 118/75  Pulse 72  Ht 5\' 4"  (1.626 m)  Wt 195 lb (88.451 kg)  BMI 33.46 kg/m2  LMP 03/07/2011 General appearance: alert, cooperative and appears stated age Head: Normocephalic, without obvious abnormality, atraumatic Eyes: conj clear, EOMI, PEERLA Ears: normal TM's and external ear canals both ears Nose: Nares normal. Septum midline. Mucosa normal. No drainage or sinus tenderness. Throat: lips, mucosa, and tongue normal; teeth and gums normal Neck: no adenopathy, no carotid bruit, no JVD, supple, symmetrical, trachea midline and thyroid not enlarged, symmetric, no tenderness/mass/nodules Back: symmetric, no curvature. ROM normal. No CVA tenderness. Lungs: clear to auscultation bilaterally Breasts: normal appearance, no masses or tenderness Heart: regular rate and rhythm, S1, S2 normal, no murmur, click, rub or gallop Abdomen: soft,  non-tender; bowel sounds normal; no masses,  no organomegaly Extremities: extremities normal, atraumatic, no cyanosis or edema Pulses: 2+ and symmetric Skin: Skin color, texture, turgor normal. No rashes or lesions Lymph nodes: Cervical, supraclavicular, and axillary nodes normal. Neurologic: Alert and oriented X 3, normal strength and tone. Normal symmetric reflexes. Normal coordination and gait    Assessment:    Healthy female exam.      Plan:     See After Visit Summary for Counseling Recommendations  Keep up a regular exercise program and make sure you are eating a healthy diet Try to eat 4 servings of dairy a day, or if you are lactose intolerant take a calcium with vitamin D daily.  Your vaccines are up to date.  Pap smear up-to-date. Due to repeat in 2 years.  Flu vaccine given today. Due for CMP, fasting lipid panel, TSH.

## 2013-10-24 LAB — COMPLETE METABOLIC PANEL WITH GFR
ALK PHOS: 50 U/L (ref 39–117)
ALT: 42 U/L — AB (ref 0–35)
AST: 36 U/L (ref 0–37)
Albumin: 4.6 g/dL (ref 3.5–5.2)
BILIRUBIN TOTAL: 0.5 mg/dL (ref 0.2–1.2)
BUN: 15 mg/dL (ref 6–23)
CO2: 30 mEq/L (ref 19–32)
CREATININE: 0.69 mg/dL (ref 0.50–1.10)
Calcium: 9.5 mg/dL (ref 8.4–10.5)
Chloride: 99 mEq/L (ref 96–112)
GFR, Est Non African American: >89 mL/min
Glucose, Bld: 94 mg/dL (ref 70–99)
Potassium: 3.9 mEq/L (ref 3.5–5.3)
SODIUM: 136 meq/L (ref 135–145)
Total Protein: 7.1 g/dL (ref 6.0–8.3)

## 2013-10-24 LAB — TSH: TSH: 15.317 u[IU]/mL — ABNORMAL HIGH (ref 0.350–4.500)

## 2013-10-24 LAB — LIPID PANEL
CHOL/HDL RATIO: 3.7 ratio
Cholesterol: 197 mg/dL (ref 0–200)
HDL: 53 mg/dL (ref >39)
LDL CALC: 111 mg/dL — AB (ref 0–99)
TRIGLYCERIDES: 163 mg/dL — AB (ref <150)
VLDL: 33 mg/dL (ref 0–40)

## 2013-10-29 ENCOUNTER — Other Ambulatory Visit: Payer: Self-pay | Admitting: *Deleted

## 2013-10-29 MED ORDER — SYNTHROID 150 MCG PO TABS: ORAL_TABLET | ORAL | Status: DC

## 2013-10-30 ENCOUNTER — Telehealth: Payer: Self-pay

## 2013-10-30 ENCOUNTER — Other Ambulatory Visit: Payer: Self-pay | Admitting: Family Medicine

## 2013-10-30 MED ORDER — SYNTHROID 175 MCG PO TABS: 175.0000 ug | ORAL_TABLET | Freq: Every day | ORAL | Status: DC

## 2013-10-30 NOTE — Telephone Encounter (Signed)
Patient advised of recommendations.  

## 2013-10-30 NOTE — Telephone Encounter (Signed)
New prescriptions sent for 175 mcg daily.

## 2013-10-30 NOTE — Telephone Encounter (Signed)
Langley Gauss called and states the thyroid medication that was sent to the pharmacy was same dose she has been taking. The copy and paste below reflect the result note from her resent TSH. I may have not routed the result note back to Dr Madilyn Fireman. Please advise if she should stay on the same dose or should we send in a increased dose of the Synthroid.   Notes Recorded by Narda Rutherford, CMA on 10/24/2013 at 6:13 PM Patient advised of results and recommendations. She is taking her thyroid med consistantly. Please send to [facility]. Notes Recorded by Hali Marry, MD on 10/24/2013 at 7:40 AM Call pt: TSH is up. Is she taking her thyroid med consistantly? If not then let me know as we may need to increase her dose. If she hasn't been as consistent then make sure taking reguarly and then recheck level in 6 weeks. LDl and TG are up some. Continue to work on low fat diet and exercise . Recheck in one year. Liver enzymes that has been borderline in teh past is borderline again. Recheck liver in 6 weeks as well.

## 2013-12-13 ENCOUNTER — Telehealth: Payer: Self-pay | Admitting: *Deleted

## 2013-12-13 DIAGNOSIS — R748 Abnormal levels of other serum enzymes: Secondary | ICD-10-CM

## 2013-12-13 DIAGNOSIS — E039 Hypothyroidism, unspecified: Secondary | ICD-10-CM

## 2013-12-13 NOTE — Telephone Encounter (Signed)
lvm informing pt that lab has been ordered and faxed.Kim Livingston [place]

## 2013-12-21 LAB — TSH: TSH: 2.138 u[IU]/mL (ref 0.350–4.500)

## 2014-01-07 ENCOUNTER — Other Ambulatory Visit: Payer: Self-pay | Admitting: Family Medicine

## 2014-01-15 ENCOUNTER — Other Ambulatory Visit: Payer: Self-pay | Admitting: Family Medicine

## 2014-02-14 ENCOUNTER — Encounter: Payer: Self-pay | Admitting: Family Medicine

## 2014-02-14 ENCOUNTER — Ambulatory Visit (INDEPENDENT_AMBULATORY_CARE_PROVIDER_SITE_OTHER): Payer: 59 | Admitting: Family Medicine

## 2014-02-14 VITALS — BP 131/87 | HR 108 | Ht 64.0 in | Wt 192.0 lb

## 2014-02-14 DIAGNOSIS — J3489 Other specified disorders of nose and nasal sinuses: Secondary | ICD-10-CM

## 2014-02-14 DIAGNOSIS — R220 Localized swelling, mass and lump, head: Secondary | ICD-10-CM

## 2014-02-14 MED ORDER — CEPHALEXIN 500 MG PO CAPS: 500.0000 mg | ORAL_CAPSULE | Freq: Four times a day (QID) | ORAL | Status: DC

## 2014-02-14 NOTE — Progress Notes (Signed)
   Subjective:    Patient ID: Kim Livingston, female    DOB: 01-Mar-1965, 48 y.o.   MRN: [id number]  HPI C/o of swelling and tenderness in the left nostril. Shelf fell nad his her nose on Christmas day , 6 days ago. Says it hurt but wasn't severe Says was tender when she would apply makeup but didn't think much about it. Then 2 days ago it really started to hurt. She says it has started to feel swollen and continues to get a little bit worse as the day goes on and then it's hard to breathe through that nostril at night.  Review of Systems     Objective:   Physical Exam  Constitutional: She appears well-developed and well-nourished.  HENT:  Head: Normocephalic and atraumatic.    Right Ear: External ear normal.  Left Ear: External ear normal.  Nose: Nose normal.  Mouth/Throat: Oropharynx is clear and moist.  Eyes: Conjunctivae and EOM are normal. Pupils are equal, round, and reactive to light.          Assessment & Plan:  Right nares pain/swollen- it is possible she could have a hematoma on the external nares. It is not on the septum. It is also little eye that her pain became much more severe several days after the incident. Clinical ahead and place her on an oral antibiotic encouraged her to ice the area. She's actually been using heat. And take an anti-inflammatory such as Aleve or ibuprofen. If it suddenly gets worse then encouraged her to go to the ED if she's not able to breathe through that nostril. Or if she's just not improved by Monday encouraged to call the office back and we will get her scheduled with ENT here locally.

## 2014-02-14 NOTE — Patient Instructions (Signed)
Ice for 10 minutes 3 times a day.  Use your Aleve or Ibuprofen for inflammation and pain relief Start the antibiotic.

## 2014-02-15 DIAGNOSIS — K651 Peritoneal abscess: Secondary | ICD-10-CM

## 2014-02-15 HISTORY — DX: Peritoneal abscess: K65.1

## 2014-02-21 ENCOUNTER — Telehealth: Payer: Self-pay

## 2014-02-21 MED ORDER — FLUCONAZOLE 150 MG PO TABS: 150.0000 mg | ORAL_TABLET | Freq: Once | ORAL | Status: DC

## 2014-02-21 NOTE — Telephone Encounter (Signed)
Kim Livingston reports she now has vaginal itching with a little discharge for 1 day. She believes this to be because of her recent antibiotic use. Denies fever, chills, sweats or pelvic pain. She would like Diflucan sent to [facility]. She has not tried OTC medications. Please advise.

## 2014-02-21 NOTE — Telephone Encounter (Signed)
rx sent to pharmacy

## 2014-03-27 ENCOUNTER — Other Ambulatory Visit: Payer: Self-pay | Admitting: Family Medicine

## 2014-03-28 ENCOUNTER — Other Ambulatory Visit: Payer: Self-pay | Admitting: *Deleted

## 2014-03-28 DIAGNOSIS — R748 Abnormal levels of other serum enzymes: Secondary | ICD-10-CM

## 2014-03-28 DIAGNOSIS — E039 Hypothyroidism, unspecified: Secondary | ICD-10-CM

## 2014-03-28 MED ORDER — SYNTHROID 175 MCG PO TABS: ORAL_TABLET | ORAL | Status: DC

## 2014-04-08 ENCOUNTER — Other Ambulatory Visit: Payer: Self-pay | Admitting: Family Medicine

## 2014-06-13 ENCOUNTER — Other Ambulatory Visit: Payer: Self-pay | Admitting: Family Medicine

## 2014-07-22 ENCOUNTER — Encounter: Payer: Self-pay | Admitting: Family Medicine

## 2014-07-22 ENCOUNTER — Ambulatory Visit (INDEPENDENT_AMBULATORY_CARE_PROVIDER_SITE_OTHER): Payer: 59 | Admitting: Family Medicine

## 2014-07-22 VITALS — BP 145/66 | HR 82 | Wt 186.0 lb

## 2014-07-22 DIAGNOSIS — J029 Acute pharyngitis, unspecified: Secondary | ICD-10-CM | POA: Diagnosis not present

## 2014-07-22 MED ORDER — METHYLPREDNISOLONE 4 MG PO TBPK: ORAL_TABLET | ORAL | Status: DC

## 2014-07-22 NOTE — Progress Notes (Signed)
CC: Kim Livingston is a 49 y.o. female is here for Sore Throat   Subjective: HPI:  Complains of a sore throat that began on Friday night. It's localized to the left lower aspect of the throat. Worse with swallowing. Somewhat better with Motrin, no other interventions as of yet. She states that she is in her regular state of health other than sore throat and some poison ivy scattered around her body. Denies any headache, fevers, chills, choking, difficulty swallowing, rashes other than poison ivy. Denies abdominal pain.   Review Of Systems Outlined In HPI  Past Medical History  Diagnosis Date  . Hypothyroidism   . Abscess of peritoneum   . Hypertension     borderline  . GERD (gastroesophageal reflux disease)     hx  . Wears glasses   . PONV (postoperative nausea and vomiting)     used scop patch-did well  . Edema     Past Surgical History  Procedure Laterality Date  . Appendectomy    . Cervical back fusion      Upper back.   . Cesarean section    . Cholecystectomy    . Tonsillectomy    . Abdominal hysterectomy      Cervix is present.   . Adenoidectomy    . Wisdom tooth extraction    . Breast lumpectomy with needle localization Right 05/05/2012    Procedure: BREAST LUMPECTOMY WITH NEEDLE LOCALIZATION;  Surgeon: Adin Hector, MD;  Location: [place];  Service: General;  Laterality: Right;   Family History  Problem Relation Age of Onset  . Lung cancer Mother   . Lupus Mother   . Cancer Mother     lung  . Heart disease Father     4 vessel bypass, smoker  . Heart failure Father   . Lung cancer Maternal Grandmother   . Stroke Maternal Grandmother   . Bone cancer Paternal Grandfather   . Cancer Maternal Aunt     History   Social History  . Marital Status: Married    Spouse Name: Mortimer Fries  . Number of Children: 1  . Years of Education: N/A   Occupational History  . MANAGER     Social History Main Topics  . Smoking status: Former Smoker    Quit  date: 08/15/2001  . Smokeless tobacco: Not on file  . Alcohol Use: No  . Drug Use: No  . Sexual Activity:    Partners: Male   Other Topics Concern  . Not on file   Social History Narrative   Some exercise 3-5 days per week, walking. .  2 cup caffeine daily.      Objective: BP 145/66 mmHg  Pulse 82  Wt 186 lb (84.369 kg)  LMP 03/07/2011  General: Alert and Oriented, No Acute Distress HEENT: Pupils equal, round, reactive to light. Conjunctivae clear.  External ears unremarkable, canals clear with intact TMs with appropriate landmarks.  Middle ear appears open without effusion. Pink inferior turbinates.  Moist mucous membranes, pharynx without inflammation nor lesions.  Neck supple without palpable lymphadenopathy nor abnormal masses. Lungs:clear comfortable work of breathing Cardiac: Regular rate and rhythm.  Mental Status: No depression, anxiety, nor agitation. Skin: Warm and dry. Mild streaks of poison ivy on the forehead, left neck, right arm.  Assessment & Plan: Langley Gauss was seen today for sore throat.  Diagnoses and all orders for this visit:  Acute pharyngitis, unspecified pharyngitis type Orders: -     POCT rapid strep  A -     methylPREDNISolone (MEDROL DOSEPAK) 4 MG TBPK tablet; Take tablets daily per instructions in pack.   Clinical exam and rapid strep test being negative lead me to believe this is viral pharyngitis. Discussed signs and symptoms be more suggestive of a bacterial infection especially if an abscess forms. Since she also has some poison ivy offered her a Dosepak to help with both the skin changes and her sore throat.   Return if symptoms worsen or fail to improve.

## 2014-09-22 ENCOUNTER — Other Ambulatory Visit: Payer: Self-pay | Admitting: Family Medicine

## 2014-10-22 ENCOUNTER — Other Ambulatory Visit: Payer: Self-pay | Admitting: Family Medicine

## 2015-01-11 ENCOUNTER — Other Ambulatory Visit: Payer: Self-pay | Admitting: Family Medicine

## 2015-02-12 ENCOUNTER — Other Ambulatory Visit: Payer: Self-pay | Admitting: Family Medicine

## 2015-03-13 ENCOUNTER — Other Ambulatory Visit: Payer: Self-pay | Admitting: Family Medicine

## 2015-03-31 ENCOUNTER — Ambulatory Visit (INDEPENDENT_AMBULATORY_CARE_PROVIDER_SITE_OTHER): Payer: Managed Care, Other (non HMO) | Admitting: Family Medicine

## 2015-03-31 ENCOUNTER — Encounter: Payer: Self-pay | Admitting: Family Medicine

## 2015-03-31 VITALS — BP 122/57 | HR 62 | Wt 190.0 lb

## 2015-03-31 DIAGNOSIS — E039 Hypothyroidism, unspecified: Secondary | ICD-10-CM | POA: Diagnosis not present

## 2015-03-31 DIAGNOSIS — I10 Essential (primary) hypertension: Secondary | ICD-10-CM

## 2015-03-31 LAB — COMPLETE METABOLIC PANEL WITH GFR
ALBUMIN: 4.1 g/dL (ref 3.6–5.1)
ALK PHOS: 56 U/L (ref 33–115)
ALT: 39 U/L — AB (ref 6–29)
AST: 28 U/L (ref 10–35)
BILIRUBIN TOTAL: 0.6 mg/dL (ref 0.2–1.2)
BUN: 13 mg/dL (ref 7–25)
CALCIUM: 9.5 mg/dL (ref 8.6–10.2)
CO2: 27 mmol/L (ref 20–31)
CREATININE: 0.73 mg/dL (ref 0.50–1.10)
Chloride: 101 mmol/L (ref 98–110)
GFR, Est African American: >89 mL/min (ref >=60)
GFR, Est Non African American: >89 mL/min (ref >=60)
GLUCOSE: 87 mg/dL (ref 65–99)
Potassium: 4.7 mmol/L (ref 3.5–5.3)
SODIUM: 140 mmol/L (ref 135–146)
TOTAL PROTEIN: 6.7 g/dL (ref 6.1–8.1)

## 2015-03-31 LAB — LIPID PANEL
CHOLESTEROL: 184 mg/dL (ref 125–200)
HDL: 44 mg/dL — ABNORMAL LOW (ref >=46)
LDL Cholesterol: 116 mg/dL (ref <130)
TRIGLYCERIDES: 121 mg/dL (ref <150)
Total CHOL/HDL Ratio: 4.2 Ratio (ref <=5.0)
VLDL: 24 mg/dL (ref <30)

## 2015-03-31 LAB — TSH: TSH: 1.29 mIU/L

## 2015-03-31 MED ORDER — HYDROCHLOROTHIAZIDE 25 MG PO TABS: ORAL_TABLET | ORAL | Status: DC

## 2015-03-31 NOTE — Progress Notes (Signed)
   Subjective:    Patient ID: Kim Livingston, female    DOB: 29-Nov-1965, 50 y.o.   MRN: [id number]  HPI Hypertension- Pt denies chest pain, SOB, dizziness, or heart palpitations.  Taking meds as directed w/o problems.  Denies medication side effects.    Hypothyroid - No recent skin or hair changes.  Energy level is good.  Taking medication regularly.     Review of Systems     Objective:   Physical Exam  Constitutional: She is oriented to person, place, and time. She appears well-developed and well-nourished.  HENT:  Head: Normocephalic and atraumatic.  Cardiovascular: Normal rate, regular rhythm and normal heart sounds.   Pulmonary/Chest: Effort normal and breath sounds normal.  Neurological: She is alert and oriented to person, place, and time.  Skin: Skin is warm and dry.  Psychiatric: She has a normal mood and affect. Her behavior is normal.          Assessment & Plan:  HTN -  well controlled. Due for labs.    F/U in 6-12 months.    Hypothyroid - due to recheck thyroid. Will call with results. Will need to refill meds for 90 days.

## 2015-04-01 ENCOUNTER — Other Ambulatory Visit: Payer: Self-pay | Admitting: Family Medicine

## 2015-04-01 MED ORDER — LEVOTHYROXINE SODIUM 175 MCG PO TABS: ORAL_TABLET | ORAL | Status: DC

## 2015-04-23 LAB — HM MAMMOGRAPHY

## 2015-04-24 ENCOUNTER — Encounter: Payer: Self-pay | Admitting: Family Medicine

## 2015-07-28 ENCOUNTER — Telehealth: Payer: Self-pay | Admitting: *Deleted

## 2015-07-28 NOTE — Telephone Encounter (Signed)
Pt called and lvm asking if she needed to have any labs done. I called her back and informed her that we had checked her labs in February; and that she needed to f/u with Dr. Madilyn Fireman 09/28/2015 for her bp. Advised to call to schedule this appt.Kim Livingston [place]

## 2015-08-01 ENCOUNTER — Ambulatory Visit (INDEPENDENT_AMBULATORY_CARE_PROVIDER_SITE_OTHER): Payer: Managed Care, Other (non HMO) | Admitting: Physician Assistant

## 2015-08-01 VITALS — BP 142/82 | HR 72 | Wt 188.0 lb

## 2015-08-01 DIAGNOSIS — E039 Hypothyroidism, unspecified: Secondary | ICD-10-CM | POA: Diagnosis not present

## 2015-08-01 DIAGNOSIS — R030 Elevated blood-pressure reading, without diagnosis of hypertension: Secondary | ICD-10-CM

## 2015-08-01 DIAGNOSIS — R748 Abnormal levels of other serum enzymes: Secondary | ICD-10-CM

## 2015-08-01 DIAGNOSIS — IMO0001 Reserved for inherently not codable concepts without codable children: Secondary | ICD-10-CM | POA: Insufficient documentation

## 2015-08-01 LAB — COMPLETE METABOLIC PANEL WITH GFR
ALT: 41 U/L — ABNORMAL HIGH (ref 6–29)
AST: 27 U/L (ref 10–35)
Albumin: 4.5 g/dL (ref 3.6–5.1)
Alkaline Phosphatase: 53 U/L (ref 33–115)
BUN: 17 mg/dL (ref 7–25)
CALCIUM: 9.9 mg/dL (ref 8.6–10.2)
CHLORIDE: 100 mmol/L (ref 98–110)
CO2: 29 mmol/L (ref 20–31)
Creat: 0.77 mg/dL (ref 0.50–1.10)
Glucose, Bld: 88 mg/dL (ref 65–99)
POTASSIUM: 4.3 mmol/L (ref 3.5–5.3)
Sodium: 139 mmol/L (ref 135–146)
Total Bilirubin: 0.5 mg/dL (ref 0.2–1.2)
Total Protein: 7.1 g/dL (ref 6.1–8.1)

## 2015-08-01 LAB — TSH: TSH: 0.65 mIU/L

## 2015-08-01 LAB — T4, FREE: FREE T4: 1.7 ng/dL (ref 0.8–1.8)

## 2015-08-01 MED ORDER — LISINOPRIL 10 MG PO TABS: 10.0000 mg | ORAL_TABLET | Freq: Every day | ORAL | Status: DC

## 2015-08-01 NOTE — Progress Notes (Signed)
   Subjective:    Patient ID: Kim Livingston, female    DOB: August 24, 1965, 50 y.o.   MRN: [id number]  HPI Patient is a 50 yo female who presents to the clinic wanting her thyroid checked. TSH was last checked in feb. Since she has gained 5lbs, her skin feels dryer than normal, she is a little more tired. She has also noticed her BP increasing. Checking at drug store and always around 140 on the top. She denies any SOB, CP, dizziness, headaches. She takes HCTZ daily.    Review of Systems  All other systems reviewed and are negative.      Objective:   Physical Exam  Constitutional: She is oriented to person, place, and time. She appears well-developed and well-nourished.  HENT:  Head: Normocephalic and atraumatic.  Cardiovascular: Normal rate, regular rhythm and normal heart sounds.   Pulmonary/Chest: Effort normal and breath sounds normal. She has no wheezes.  Neurological: She is alert and oriented to person, place, and time.  No pedal edema.   Psychiatric: She has a normal mood and affect. Her behavior is normal.          Assessment & Plan:  Hypothyroidism- will recheck and adjust accordingly.   Elevated blood pressure/elevated liver enyzmes- added lisinopril to HCTZ. Discussed side effects and potential cough. Follow up in 6 weeks. CMP ordered.

## 2016-03-10 ENCOUNTER — Other Ambulatory Visit: Payer: Self-pay | Admitting: *Deleted

## 2016-03-10 MED ORDER — LEVOTHYROXINE SODIUM 175 MCG PO TABS: ORAL_TABLET | ORAL | 0 refills | Status: DC

## 2016-03-10 MED ORDER — HYDROCHLOROTHIAZIDE 25 MG PO TABS: ORAL_TABLET | ORAL | 0 refills | Status: DC

## 2016-03-18 ENCOUNTER — Ambulatory Visit: Payer: Managed Care, Other (non HMO) | Admitting: Family Medicine

## 2016-03-25 ENCOUNTER — Ambulatory Visit (INDEPENDENT_AMBULATORY_CARE_PROVIDER_SITE_OTHER): Payer: BLUE CROSS/BLUE SHIELD | Admitting: Family Medicine

## 2016-03-25 ENCOUNTER — Encounter: Payer: Self-pay | Admitting: Family Medicine

## 2016-03-25 VITALS — BP 128/59 | HR 71 | Ht 64.0 in | Wt 187.0 lb

## 2016-03-25 DIAGNOSIS — R252 Cramp and spasm: Secondary | ICD-10-CM

## 2016-03-25 DIAGNOSIS — L719 Rosacea, unspecified: Secondary | ICD-10-CM | POA: Diagnosis not present

## 2016-03-25 DIAGNOSIS — I10 Essential (primary) hypertension: Secondary | ICD-10-CM

## 2016-03-25 MED ORDER — AZELAIC ACID 15 % EX GEL: 1.0000 "application " | Freq: Two times a day (BID) | CUTANEOUS | 99 refills | Status: DC

## 2016-03-25 MED ORDER — BRIMONIDINE TARTRATE 0.33 % EX GEL: CUTANEOUS | 99 refills | Status: DC

## 2016-03-25 MED ORDER — LEVOTHYROXINE SODIUM 175 MCG PO TABS: ORAL_TABLET | ORAL | 6 refills | Status: DC

## 2016-03-25 MED ORDER — HYDROCHLOROTHIAZIDE 25 MG PO TABS: ORAL_TABLET | ORAL | 6 refills | Status: DC

## 2016-03-25 NOTE — Progress Notes (Signed)
Subjective:    CC: HTN  HPI:  Hypertension- Pt denies chest pain, SOB, dizziness, or heart palpitations.  Taking meds as directed w/o problems.  Denies medication side effects.  Patient says the lisinopril HCTZ was actually dropping her blood pressure too low so she just went back to the HCTZ by itself.  Rosacea - she would like me to take over prescribing her Neola for her rosacea. She's been on it for quite some time and has been doing well on it.   She's also been having some cramping in her toes and says will get up and take mustard for relief a couple times a week.  Past medical history, Surgical history, Family history not pertinant except as noted below, Social history, Allergies, and medications have been entered into the medical record, reviewed, and corrections made.   Review of Systems: No fevers, chills, night sweats, weight loss, chest pain, or shortness of breath.   Objective:    General: Well Developed, well nourished, and in no acute distress.  Neuro: Alert and oriented x3, extra-ocular muscles intact, sensation grossly intact.  HEENT: Normocephalic, atraumatic  Skin: Warm and dry, no rashes. Cardiac: Regular rate and rhythm, no murmurs rubs or gallops, no lower extremity edema.  Respiratory: Clear to auscultation bilaterally. Not using accessory muscles, speaking in full sentences.   Impression and Recommendations:    HTN - Well controlled. Continue current regimen. Follow up in  6 months. Due for CMP. She will fax [place] her lipids.    Rosacea - refill her medications for a year. She's been on her current regimen for 3 years and does well with it.  Toe cramping-we'll check electrolytes. Work on gentle stretching and appropriate shoe wear to improve cramping.

## 2016-03-26 LAB — COMPLETE METABOLIC PANEL WITH GFR
ALT: 34 U/L — AB (ref 6–29)
AST: 23 U/L (ref 10–35)
Albumin: 4.5 g/dL (ref 3.6–5.1)
Alkaline Phosphatase: 62 U/L (ref 33–115)
BILIRUBIN TOTAL: 0.4 mg/dL (ref 0.2–1.2)
BUN: 16 mg/dL (ref 7–25)
CALCIUM: 9.8 mg/dL (ref 8.6–10.2)
CO2: 30 mmol/L (ref 20–31)
CREATININE: 0.88 mg/dL (ref 0.50–1.10)
Chloride: 99 mmol/L (ref 98–110)
GFR, EST AFRICAN AMERICAN: 89 mL/min (ref >=60)
GFR, Est Non African American: 77 mL/min (ref >=60)
Glucose, Bld: 89 mg/dL (ref 65–99)
Potassium: 3.9 mmol/L (ref 3.5–5.3)
Sodium: 138 mmol/L (ref 135–146)
TOTAL PROTEIN: 7.1 g/dL (ref 6.1–8.1)

## 2016-04-01 ENCOUNTER — Telehealth: Payer: Self-pay

## 2016-04-01 NOTE — Telephone Encounter (Signed)
Pre Authorization was sent to Cover My Meds. Key: [id number]

## 2016-04-14 NOTE — Telephone Encounter (Signed)
Denied. Letter in box

## 2016-04-22 ENCOUNTER — Telehealth: Payer: Self-pay | Admitting: Family Medicine

## 2016-04-22 NOTE — Telephone Encounter (Signed)
Pt informed.Kim Livingston  

## 2016-04-22 NOTE — Telephone Encounter (Signed)
Call pt: Kim Livingston denies with insurance. There really aren't other choices. She may want to cal around and see who has the cheapest [place] or call her insurance to see if there is a substitute that they will cover.  Beatrice Lecher, MD'

## 2016-06-15 DIAGNOSIS — Z1231 Encounter for screening mammogram for malignant neoplasm of breast: Secondary | ICD-10-CM | POA: Diagnosis not present

## 2016-06-15 LAB — HM MAMMOGRAPHY

## 2016-07-09 ENCOUNTER — Encounter: Payer: Self-pay | Admitting: Family Medicine

## 2016-11-15 ENCOUNTER — Telehealth: Payer: Self-pay | Admitting: Family Medicine

## 2016-11-15 DIAGNOSIS — E039 Hypothyroidism, unspecified: Secondary | ICD-10-CM

## 2016-11-15 DIAGNOSIS — I10 Essential (primary) hypertension: Secondary | ICD-10-CM

## 2016-11-15 NOTE — Telephone Encounter (Signed)
Okay to push out follow-up until next February but she needs to fax [place] her blood pressures from work. I don't mind that she is getting them checked there but we just need to have some record of that here

## 2016-11-15 NOTE — Telephone Encounter (Signed)
Lab orders placed. Pt advised that orders were in and that she needs to schedule a follow up. Pt states she spoke with PCP at last visit about bringing in her check ups from [facility] and that would be sufficient. Pt has to get her BP checked every 3 months at work. Advised per note that an appointment was required, Pt states she wants PCP to be asked before she will schedule.

## 2016-11-15 NOTE — Telephone Encounter (Signed)
Pt called. She is requesting labs for thyroid chk tomorrow.  Thank you

## 2016-11-16 DIAGNOSIS — I10 Essential (primary) hypertension: Secondary | ICD-10-CM | POA: Diagnosis not present

## 2016-11-16 DIAGNOSIS — E039 Hypothyroidism, unspecified: Secondary | ICD-10-CM | POA: Diagnosis not present

## 2016-11-16 LAB — COMPLETE METABOLIC PANEL WITH GFR
AG RATIO: 1.9 (calc) (ref 1.0–2.5)
ALBUMIN MSPROF: 4.5 g/dL (ref 3.6–5.1)
ALKALINE PHOSPHATASE (APISO): 62 U/L (ref 33–115)
ALT: 42 U/L — ABNORMAL HIGH (ref 6–29)
AST: 29 U/L (ref 10–35)
BILIRUBIN TOTAL: 0.5 mg/dL (ref 0.2–1.2)
BUN: 16 mg/dL (ref 7–25)
CHLORIDE: 100 mmol/L (ref 98–110)
CO2: 30 mmol/L (ref 20–32)
Calcium: 9.6 mg/dL (ref 8.6–10.2)
Creat: 0.99 mg/dL (ref 0.50–1.10)
GFR, Est African American: 78 mL/min/{1.73_m2} (ref >=60)
GFR, Est Non African American: 67 mL/min/{1.73_m2} (ref >=60)
GLOBULIN: 2.4 g/dL (ref 1.9–3.7)
GLUCOSE: 84 mg/dL (ref 65–99)
POTASSIUM: 4.2 mmol/L (ref 3.5–5.3)
SODIUM: 139 mmol/L (ref 135–146)
Total Protein: 6.9 g/dL (ref 6.1–8.1)

## 2016-11-16 LAB — TSH: TSH: 0.24 mIU/L — ABNORMAL LOW

## 2016-11-16 LAB — T4, FREE: Free T4: 1.6 ng/dL (ref 0.8–1.8)

## 2016-11-16 NOTE — Telephone Encounter (Signed)
Left VM with recommendation  

## 2016-11-16 NOTE — Telephone Encounter (Signed)
Thank you :)

## 2016-12-06 ENCOUNTER — Encounter: Payer: Self-pay | Admitting: Obstetrics & Gynecology

## 2016-12-06 ENCOUNTER — Ambulatory Visit (INDEPENDENT_AMBULATORY_CARE_PROVIDER_SITE_OTHER): Payer: BLUE CROSS/BLUE SHIELD | Admitting: Obstetrics & Gynecology

## 2016-12-06 VITALS — BP 132/80 | Ht 64.25 in | Wt 189.0 lb

## 2016-12-06 DIAGNOSIS — Z1151 Encounter for screening for human papillomavirus (HPV): Secondary | ICD-10-CM | POA: Diagnosis not present

## 2016-12-06 DIAGNOSIS — Z90711 Acquired absence of uterus with remaining cervical stump: Secondary | ICD-10-CM | POA: Diagnosis not present

## 2016-12-06 DIAGNOSIS — Z01411 Encounter for gynecological examination (general) (routine) with abnormal findings: Secondary | ICD-10-CM | POA: Diagnosis not present

## 2016-12-06 NOTE — Progress Notes (Signed)
    Kim Livingston 07-15-1965 [id number]   History:    51 y.o. G1P1L1 Married.  Daughter 63 yo at [employer].  RP:  Established patient presenting  for annual gyn exam   HPI:  S/P Supracervical hysterectomy.  No menopausal Sx.  No pelvic pain.  Breasts wnl.  Mictions/BMs wnl.  Past medical history,surgical history, family history and social history were all reviewed and documented in the EPIC chart.  Gynecologic History Patient's last menstrual period was 03/07/2011. Contraception: status post hysterectomy Last Pap: 2017. Results were: normal Last mammogram: 06/2016. Results were: Benign  Obstetric History OB History  Gravida Para Term Preterm AB Living  1 1       1   SAB TAB Ectopic Multiple Live Births               # Outcome Date GA Lbr Len/2nd Weight Sex Delivery Anes PTL Lv  1 Para                ROS: A ROS was performed and pertinent positives and negatives are included in the history.  GENERAL: No fevers or chills. HEENT: No change in vision, no earache, sore throat or sinus congestion. NECK: No pain or stiffness. CARDIOVASCULAR: No chest pain or pressure. No palpitations. PULMONARY: No shortness of breath, cough or wheeze. GASTROINTESTINAL: No abdominal pain, nausea, vomiting or diarrhea, melena or bright red blood per rectum. GENITOURINARY: No urinary frequency, urgency, hesitancy or dysuria. MUSCULOSKELETAL: No joint or muscle pain, no back pain, no recent trauma. DERMATOLOGIC: No rash, no itching, no lesions. ENDOCRINE: No polyuria, polydipsia, no heat or cold intolerance. No recent change in weight. HEMATOLOGICAL: No anemia or easy bruising or bleeding. NEUROLOGIC: No headache, seizures, numbness, tingling or weakness. PSYCHIATRIC: No depression, no loss of interest in normal activity or change in sleep pattern.     Exam:   BP 132/80   Ht 5' 4.25" (1.632 m)   Wt 189 lb (85.7 kg)   LMP 03/07/2011   BMI 32.19 kg/m   Body mass index is 32.19 kg/m.  General  appearance : Well developed well nourished female. No acute distress HEENT: Eyes: no retinal hemorrhage or exudates,  Neck supple, trachea midline, no carotid bruits, no thyroidmegaly Lungs: Clear to auscultation, no rhonchi or wheezes, or rib retractions  Heart: Regular rate and rhythm, no murmurs or gallops Breast:Examined in sitting and supine position were symmetrical in appearance, no palpable masses or tenderness,  no skin retraction, no nipple inversion, no nipple discharge, no skin discoloration, no axillary or supraclavicular lymphadenopathy Abdomen: no palpable masses or tenderness, no rebound or guarding Extremities: no edema or skin discoloration or tenderness  Pelvic: Vulva normal  Bartholin, Urethra, Skene Glands: Within normal limits             Vagina: No gross lesions or discharge  Cervix: No gross lesions or discharge.  Pap/HPV HR done.  Uterus  Absent  Adnexa  Without masses or tenderness  Anus and perineum  normal    Assessment/Plan:  51 y.o. female for annual exam   1. Encounter for gynecological examination with abnormal finding Gyn exam s/p Supracervical Hyst.  Pap/HPV HR done.  Breasts wnl.  Screening Mammo benign 06/2016.  2. H/O abdominal supracervical subtotal hysterectomy   Princess Bruins MD, 4:07 PM 12/06/2016

## 2016-12-06 NOTE — Patient Instructions (Signed)
1. Encounter for gynecological examination with abnormal finding Gyn exam s/p Supracervical Hyst.  Pap/HPV HR done.  Breasts wnl.  Screening Mammo benign 06/2016.  2. H/O abdominal supracervical subtotal hysterectomy  Langley Gauss, it was a pleasure seeing you today!  I will inform you of your results as soon as available.  Health Maintenance, Female Adopting a healthy lifestyle and getting preventive care can go a long way to promote health and wellness. Talk with your health care provider about what schedule of regular examinations is right for you. This is a good chance for you to check in with your provider about disease prevention and staying healthy. In between checkups, there are plenty of things you can do on your own. Experts have done a lot of research about which lifestyle changes and preventive measures are most likely to keep you healthy. Ask your health care provider for more information. Weight and diet Eat a healthy diet  Be sure to include plenty of vegetables, fruits, low-fat dairy products, and lean protein.  Do not eat a lot of foods high in solid fats, added sugars, or salt.  Get regular exercise. This is one of the most important things you can do for your health. ? Most adults should exercise for at least 150 minutes each week. The exercise should increase your heart rate and make you sweat (moderate-intensity exercise). ? Most adults should also do strengthening exercises at least twice a week. This is in addition to the moderate-intensity exercise.  Maintain a healthy weight  Body mass index (BMI) is a measurement that can be used to identify possible weight problems. It estimates body fat based on height and weight. Your health care provider can help determine your BMI and help you achieve or maintain a healthy weight.  For females 28 years of age and older: ? A BMI below 18.5 is considered underweight. ? A BMI of 18.5 to 24.9 is normal. ? A BMI of 25 to 29.9 is  considered overweight. ? A BMI of 30 and above is considered obese.  Watch levels of cholesterol and blood lipids  You should start having your blood tested for lipids and cholesterol at 49 years of age, then have this test every 5 years.  You may need to have your cholesterol levels checked more often if: ? Your lipid or cholesterol levels are high. ? You are older than 49 years of age. ? You are at high risk for heart disease.  Cancer screening Lung Cancer  Lung cancer screening is recommended for adults 65-51 years old who are at high risk for lung cancer because of a history of smoking.  A yearly low-dose CT scan of the lungs is recommended for people who: ? Currently smoke. ? Have quit within the past 15 years. ? Have at least a 30-pack-year history of smoking. A pack year is smoking an average of one pack of cigarettes a day for 1 year.  Yearly screening should continue until it has been 15 years since you quit.  Yearly screening should stop if you develop a health problem that would prevent you from having lung cancer treatment.  Breast Cancer  Practice breast self-awareness. This means understanding how your breasts normally appear and feel.  It also means doing regular breast self-exams. Let your health care provider know about any changes, no matter how small.  If you are in your 20s or 30s, you should have a clinical breast exam (CBE) by a health care provider every 1-3 years  as part of a regular health exam.  If you are 46 or older, have a CBE every year. Also consider having a breast X-ray (mammogram) every year.  If you have a family history of breast cancer, talk to your health care provider about genetic screening.  If you are at high risk for breast cancer, talk to your health care provider about having an MRI and a mammogram every year.  Breast cancer gene (BRCA) assessment is recommended for women who have family members with BRCA-related cancers.  BRCA-related cancers include: ? Breast. ? Ovarian. ? Tubal. ? Peritoneal cancers.  Results of the assessment will determine the need for genetic counseling and BRCA1 and BRCA2 testing.  Cervical Cancer Your health care provider may recommend that you be screened regularly for cancer of the pelvic organs (ovaries, uterus, and vagina). This screening involves a pelvic examination, including checking for microscopic changes to the surface of your cervix (Pap test). You may be encouraged to have this screening done every 3 years, beginning at age 60.  For women ages 46-65, health care providers may recommend pelvic exams and Pap testing every 3 years, or they may recommend the Pap and pelvic exam, combined with testing for human papilloma virus (HPV), every 5 years. Some types of HPV increase your risk of cervical cancer. Testing for HPV may also be done on women of any age with unclear Pap test results.  Other health care providers may not recommend any screening for nonpregnant women who are considered low risk for pelvic cancer and who do not have symptoms. Ask your health care provider if a screening pelvic exam is right for you.  If you have had past treatment for cervical cancer or a condition that could lead to cancer, you need Pap tests and screening for cancer for at least 20 years after your treatment. If Pap tests have been discontinued, your risk factors (such as having a new sexual partner) need to be reassessed to determine if screening should resume. Some women have medical problems that increase the chance of getting cervical cancer. In these cases, your health care provider may recommend more frequent screening and Pap tests.  Colorectal Cancer  This type of cancer can be detected and often prevented.  Routine colorectal cancer screening usually begins at 49 years of age and continues through 49 years of age.  Your health care provider may recommend screening at an earlier age if  you have risk factors for colon cancer.  Your health care provider may also recommend using home test kits to check for hidden blood in the stool.  A small camera at the end of a tube can be used to examine your colon directly (sigmoidoscopy or colonoscopy). This is done to check for the earliest forms of colorectal cancer.  Routine screening usually begins at age 23.  Direct examination of the colon should be repeated every 5-10 years through 49 years of age. However, you may need to be screened more often if early forms of precancerous polyps or small growths are found.  Skin Cancer  Check your skin from head to toe regularly.  Tell your health care provider about any new moles or changes in moles, especially if there is a change in a mole's shape or color.  Also tell your health care provider if you have a mole that is larger than the size of a pencil eraser.  Always use sunscreen. Apply sunscreen liberally and repeatedly throughout the day.  Protect yourself by  wearing long sleeves, pants, a wide-brimmed hat, and sunglasses whenever you are outside.  Heart disease, diabetes, and high blood pressure  High blood pressure causes heart disease and increases the risk of stroke. High blood pressure is more likely to develop in: ? People who have blood pressure in the high end of the normal range (130-139/85-89 mm Hg). ? People who are overweight or obese. ? People who are African American.  If you are 81-48 years of age, have your blood pressure checked every 3-5 years. If you are 42 years of age or older, have your blood pressure checked every year. You should have your blood pressure measured twice-once when you are at a hospital or clinic, and once when you are not at a hospital or clinic. Record the average of the two measurements. To check your blood pressure when you are not at a hospital or clinic, you can use: ? An automated blood pressure machine at a pharmacy. ? A home blood  pressure monitor.  If you are between 67 years and 6 years old, ask your health care provider if you should take aspirin to prevent strokes.  Have regular diabetes screenings. This involves taking a blood sample to check your fasting blood sugar level. ? If you are at a normal weight and have a low risk for diabetes, have this test once every three years after 49 years of age. ? If you are overweight and have a high risk for diabetes, consider being tested at a younger age or more often. Preventing infection Hepatitis B  If you have a higher risk for hepatitis B, you should be screened for this virus. You are considered at high risk for hepatitis B if: ? You were born in a country where hepatitis B is common. Ask your health care provider which countries are considered high risk. ? Your parents were born in a high-risk country, and you have not been immunized against hepatitis B (hepatitis B vaccine). ? You have HIV or AIDS. ? You use needles to inject street drugs. ? You live with someone who has hepatitis B. ? You have had sex with someone who has hepatitis B. ? You get hemodialysis treatment. ? You take certain medicines for conditions, including cancer, organ transplantation, and autoimmune conditions.  Hepatitis C  Blood testing is recommended for: ? Everyone born from [birth year] through 1963. ? Anyone with known risk factors for hepatitis C.  Sexually transmitted infections (STIs)  You should be screened for sexually transmitted infections (STIs) including gonorrhea and chlamydia if: ? You are sexually active and are younger than 49 years of age. ? You are older than 49 years of age and your health care provider tells you that you are at risk for this type of infection. ? Your sexual activity has changed since you were last screened and you are at an increased risk for chlamydia or gonorrhea. Ask your health care provider if you are at risk.  If you do not have HIV, but are at risk,  it may be recommended that you take a prescription medicine daily to prevent HIV infection. This is called pre-exposure prophylaxis (PrEP). You are considered at risk if: ? You are sexually active and do not regularly use condoms or know the HIV status of your partner(s). ? You take drugs by injection. ? You are sexually active with a partner who has HIV.  Talk with your health care provider about whether you are at high risk of being infected with  HIV. If you choose to begin PrEP, you should first be tested for HIV. You should then be tested every 3 months for as long as you are taking PrEP. Pregnancy  If you are premenopausal and you may become pregnant, ask your health care provider about preconception counseling.  If you may become pregnant, take 400 to 800 micrograms (mcg) of folic acid every day.  If you want to prevent pregnancy, talk to your health care provider about birth control (contraception). Osteoporosis and menopause  Osteoporosis is a disease in which the bones lose minerals and strength with aging. This can result in serious bone fractures. Your risk for osteoporosis can be identified using a bone density scan.  If you are 35 years of age or older, or if you are at risk for osteoporosis and fractures, ask your health care provider if you should be screened.  Ask your health care provider whether you should take a calcium or vitamin D supplement to lower your risk for osteoporosis.  Menopause may have certain physical symptoms and risks.  Hormone replacement therapy may reduce some of these symptoms and risks. Talk to your health care provider about whether hormone replacement therapy is right for you. Follow these instructions at home:  Schedule regular health, dental, and eye exams.  Stay current with your immunizations.  Do not use any tobacco products including cigarettes, chewing tobacco, or electronic cigarettes.  If you are pregnant, do not drink  alcohol.  If you are breastfeeding, limit how much and how often you drink alcohol.  Limit alcohol intake to no more than 1 drink per day for nonpregnant women. One drink equals 12 ounces of beer, 5 ounces of wine, or 1 ounces of hard liquor.  Do not use street drugs.  Do not share needles.  Ask your health care provider for help if you need support or information about quitting drugs.  Tell your health care provider if you often feel depressed.  Tell your health care provider if you have ever been abused or do not feel safe at home. This information is not intended to replace advice given to you by your health care provider. Make sure you discuss any questions you have with your health care provider. Document Released: 08/17/2010 Document Revised: 07/10/2015 Document Reviewed: 11/05/2014 Elsevier Interactive Patient Education  [employer].

## 2016-12-06 NOTE — Addendum Note (Signed)
Addended by: Thurnell Garbe A on: 12/06/2016 04:37 PM   Modules accepted: Orders

## 2016-12-07 LAB — PAP, TP IMAGING W/ HPV RNA, RFLX HPV TYPE 16,18/45: HPV DNA High Risk: NOT DETECTED

## 2016-12-16 ENCOUNTER — Ambulatory Visit (INDEPENDENT_AMBULATORY_CARE_PROVIDER_SITE_OTHER): Payer: BLUE CROSS/BLUE SHIELD | Admitting: Family Medicine

## 2016-12-16 ENCOUNTER — Encounter: Payer: Self-pay | Admitting: Family Medicine

## 2016-12-16 VITALS — BP 126/69 | HR 78 | Ht 64.0 in | Wt 188.0 lb

## 2016-12-16 DIAGNOSIS — I10 Essential (primary) hypertension: Secondary | ICD-10-CM | POA: Diagnosis not present

## 2016-12-16 DIAGNOSIS — E039 Hypothyroidism, unspecified: Secondary | ICD-10-CM | POA: Diagnosis not present

## 2016-12-16 MED ORDER — HYDROCHLOROTHIAZIDE 25 MG PO TABS
ORAL_TABLET | ORAL | 3 refills | Status: DC
Start: 2016-12-16 — End: 2017-10-20

## 2016-12-16 MED ORDER — HYDROCHLOROTHIAZIDE 25 MG PO TABS: ORAL_TABLET | ORAL | 1 refills | Status: DC

## 2016-12-16 NOTE — Patient Instructions (Signed)
Go for recheck on thyroid at end of November.

## 2016-12-16 NOTE — Progress Notes (Signed)
   Subjective:    Patient ID: Kim Livingston, female    DOB: 1965/09/23, 51 y.o.   MRN: [id number]  HPI Hypertension- Pt denies chest pain, SOB, dizziness, or heart palpitations.  Taking meds as directed w/o problems.  Denies medication side effects.     Hypothyroid - we recently adjusted her dose based on low TSH.  "take half of a tab one day a week and a whole tab the other 6 days and then repeat this in each week. Said for example every Saturday she can just let the tab and take half. After 6 weeks recheck TSH."   Lab Results  Component Value Date   TSH 0.24 (L) 11/16/2016    Review of Systems     Objective:   Physical Exam  Constitutional: She is oriented to person, place, and time. She appears well-developed and well-nourished.  HENT:  Head: Normocephalic and atraumatic.  Cardiovascular: Normal rate, regular rhythm and normal heart sounds.   Pulmonary/Chest: Effort normal and breath sounds normal.  Neurological: She is alert and oriented to person, place, and time.  Skin: Skin is warm and dry.  Psychiatric: She has a normal mood and affect. Her behavior is normal.       Assessment & Plan:  HTN - Well controlled. Continue current regimen. Follow up in 12 months. She has reportedly helped checks and include blood pressure and weight at work and she will forward that to [place] quarterly.  Refill medication for 1 year.  Hypothyroid -we recently adjusted her dose a couple weeks ago.  Plan to go at the end of the month for recheck.  She is a count medication does not need a refill currently.

## 2017-01-21 DIAGNOSIS — E039 Hypothyroidism, unspecified: Secondary | ICD-10-CM | POA: Diagnosis not present

## 2017-01-22 LAB — TSH: TSH: 1.31 mIU/L

## 2017-01-23 NOTE — Progress Notes (Signed)
All labs are normal. 

## 2017-02-25 ENCOUNTER — Other Ambulatory Visit: Payer: Self-pay | Admitting: Family Medicine

## 2017-03-29 ENCOUNTER — Ambulatory Visit: Payer: BLUE CROSS/BLUE SHIELD | Admitting: Family Medicine

## 2017-03-29 ENCOUNTER — Encounter: Payer: Self-pay | Admitting: Family Medicine

## 2017-03-29 VITALS — BP 130/51 | HR 67 | Ht 64.0 in | Wt 192.0 lb

## 2017-03-29 DIAGNOSIS — M549 Dorsalgia, unspecified: Secondary | ICD-10-CM

## 2017-03-29 DIAGNOSIS — M5442 Lumbago with sciatica, left side: Secondary | ICD-10-CM

## 2017-03-29 MED ORDER — CYCLOBENZAPRINE HCL 10 MG PO TABS: 5.0000 mg | ORAL_TABLET | Freq: Every evening | ORAL | 0 refills | Status: DC | PRN

## 2017-03-29 NOTE — Progress Notes (Signed)
Subjective:    Patient ID: Kim Livingston, female    DOB: 30-May-1965, 52 y.o.   MRN: [id number]  HPI 51 year old female with a history of hypertension and hypothyroidism comes in today complaining of back pain that started approximately 1 week ago.  She denies any known injury or trauma.  She says she just woke up with it one morning around 4 AM.  It has bothered her ever since though it does feel a little better today than it has.  She is been using heat and ice.  She is been taking ibuprofen 200 mg.  The pain is mostly over the mid low back and radiates into her left buttock cheek.  The pain in the left buttock cheek is more of a burning sensation.  She says it feels better if she sits still and does not move and it is worse with more activity.  Also in her upper neck she is just had some tension in the muscles as well as a lot of popping and cracking.  Though it has not been particularly painful popping  Last lumbar x-ray on file was from 2005, showing some degenerative disc disease at L5-S1 as well as some facet arthritis.  Review of Systems   BP (!) 130/51   Pulse 67   Ht 5\' 4"  (1.626 m)   Wt 192 lb (87.1 kg)   LMP 03/07/2011   SpO2 100%   BMI 32.96 kg/m     Allergies  Allergen Reactions  . Penicillins     REACTION: hives    Past Medical History:  Diagnosis Date  . Abscess of peritoneum ([place])   . Edema   . GERD (gastroesophageal reflux disease)    hx  . Hypertension    borderline  . Hypothyroidism   . PONV (postoperative nausea and vomiting)    used scop patch-did well  . Wears glasses     Past Surgical History:  Procedure Laterality Date  . ABDOMINAL HYSTERECTOMY     Cervix is present.   . ADENOIDECTOMY    . APPENDECTOMY    . BREAST LUMPECTOMY WITH NEEDLE LOCALIZATION Right 05/05/2012   Procedure: BREAST LUMPECTOMY WITH NEEDLE LOCALIZATION;  Surgeon: Adin Hector, MD;  Location: [facility];  Service: General;  Laterality: Right;  . cervical  back fusion     Upper back.   . CESAREAN SECTION    . CHOLECYSTECTOMY    . TONSILLECTOMY    . WISDOM TOOTH EXTRACTION      Social History   Socioeconomic History  . Marital status: Married    Spouse name: Mortimer Fries  . Number of children: 1  . Years of education: Not on file  . Highest education level: Not on file  Social Needs  . Financial resource strain: Not on file  . Food insecurity - worry: Not on file  . Food insecurity - inability: Not on file  . Transportation needs - medical: Not on file  . Transportation needs - non-medical: Not on file  Occupational History  . Occupation: Programme researcher, broadcasting/film/video: [employer]   Tobacco Use  . Smoking status: Former Smoker    Last attempt to quit: 08/15/2001    Years since quitting: 15.6  . Smokeless tobacco: Never Used  Substance and Sexual Activity  . Alcohol use: No  . Drug use: No  . Sexual activity: Yes    Partners: Male    Birth control/protection: Surgical  Comment: 1st intercourse- 19, partners- 2, married- 62 yrs   Other Topics Concern  . Not on file  Social History Narrative   Some exercise 3-5 days per week, walking. .  2 cup caffeine daily.     Family History  Problem Relation Age of Onset  . Lung cancer Mother   . Lupus Mother   . Cancer Mother        lung  . Heart disease Father        4 vessel bypass, smoker  . Heart failure Father   . Lung cancer Maternal Grandmother   . Stroke Maternal Grandmother   . Bone cancer Paternal Grandfather   . Cancer Maternal Aunt   . Cancer Paternal Uncle        leukemia  . Cancer Paternal Uncle        lung    Outpatient Encounter Medications as of 03/29/2017  Medication Sig  . Azelaic Acid (FINACEA) 15 % cream Apply 1 application topically 2 (two) times daily. After skin is thoroughly washed and patted dry, gently but thoroughly massage a thin film of azelaic acid cream into the affected area twice daily, in the morning and evening.  . Brimonidine Tartrate  (MIRVASO) 0.33 % GEL Apply thin film once daily as directed  . calcium carbonate (TUMS - DOSED IN MG ELEMENTAL CALCIUM) 500 MG chewable tablet Chew 1 tablet by mouth at bedtime.  . hydrochlorothiazide (HYDRODIURIL) 25 MG tablet TAKE 1 TABLET (25 MG TOTAL) BY MOUTH DAILY.  Marland Kitchen levothyroxine (SYNTHROID) 175 MCG tablet TAKE ONE TABLET BY MOUTH DAILY BEFORE BREAKFAST  . Omega-3 Fatty Acids 1200 MG CAPS Take 2,400 mg by mouth 2 (two) times daily.  . Pediatric Multiple Vit-C-FA (PEDIATRIC MULTIVITAMIN) chewable tablet Chew 1 tablet by mouth daily.  . cyclobenzaprine (FLEXERIL) 10 MG tablet Take 0.5-1 tablets (5-10 mg total) by mouth at bedtime as needed for muscle spasms.   No facility-administered encounter medications on file as of 03/29/2017.          Objective:   Physical Exam  Constitutional: She is oriented to person, place, and time. She appears well-developed and well-nourished.  HENT:  Head: Normocephalic and atraumatic.  Eyes: Conjunctivae and EOM are normal.  Cardiovascular: Normal rate.  Pulmonary/Chest: Effort normal.  Musculoskeletal:  Normal lumbar flexion, extension, rotation right and left and side bending.  In fact she actually says it felt good to flex forward.  Nontender over the lumbar spine or SI joints.  Negative straight leg raise.  Hip, knee, ankle strength 5 out of 5 bilaterally.  Patellar reflexes and biceps reflexes 1+ bilaterally.  Cervical spine with normal flexion, extension, rotation right and left.  She does have some tightness of the trapezius muscles bilaterally.  Neurological: She is alert and oriented to person, place, and time.  Skin: Skin is dry. No pallor.  Psychiatric: She has a normal mood and affect. Her behavior is normal.  Vitals reviewed.       Assessment & Plan:  Acute low back strain-with known history of degenerative disc disease L5-S1-she does have some radicular symptoms with some burning sensation into the left buttock cheek.  Given handout  with stretches for her low back.  Also will have her increase her ibuprofen to 600 mg 3 times a day.  If not improving over the next 2 weeks and please give [place] a call.  We can always consider a trial of prednisone and may be even x-rays if needed.  She will call  back if she decides she wants to do prednisone.  Upper back pain-we will given handout for exercises for her upper back as well.  Recommend a trial of a muscle relaxer and increase ibuprofen to 60 mg

## 2017-03-29 NOTE — Patient Instructions (Signed)
Times a day.  Just make sure to take with food and water to avoid any stomach upset or irritation. Work on daily stretches. If not feeling better after couple weeks and please let us know and we can workup further with x-rays etc. Definitely utilized or heat and we can try muscle relaxer if needed. Muscle relaxer can be sedating so just be careful about taking it during the daytime.

## 2017-06-13 LAB — LIPID PANEL
Cholesterol: 166 (ref 0–200)
HDL: 37 (ref 35–70)
LDL Cholesterol: 108
TRIGLYCERIDES: 104 (ref 40–160)

## 2017-06-13 LAB — BASIC METABOLIC PANEL: Glucose: 72

## 2017-08-19 DIAGNOSIS — Z1231 Encounter for screening mammogram for malignant neoplasm of breast: Secondary | ICD-10-CM | POA: Diagnosis not present

## 2017-08-19 LAB — HM MAMMOGRAPHY

## 2017-08-25 DIAGNOSIS — R921 Mammographic calcification found on diagnostic imaging of breast: Secondary | ICD-10-CM | POA: Diagnosis not present

## 2017-08-25 LAB — HM MAMMOGRAPHY

## 2017-09-08 ENCOUNTER — Encounter: Payer: Self-pay | Admitting: Family Medicine

## 2017-09-09 ENCOUNTER — Encounter: Payer: Self-pay | Admitting: Family Medicine

## 2017-10-20 ENCOUNTER — Encounter: Payer: Self-pay | Admitting: Family Medicine

## 2017-10-20 ENCOUNTER — Ambulatory Visit: Payer: BLUE CROSS/BLUE SHIELD | Admitting: Family Medicine

## 2017-10-20 VITALS — BP 119/52 | HR 80 | Ht 64.0 in | Wt 194.0 lb

## 2017-10-20 DIAGNOSIS — Z23 Encounter for immunization: Secondary | ICD-10-CM | POA: Diagnosis not present

## 2017-10-20 DIAGNOSIS — R748 Abnormal levels of other serum enzymes: Secondary | ICD-10-CM

## 2017-10-20 DIAGNOSIS — I10 Essential (primary) hypertension: Secondary | ICD-10-CM | POA: Diagnosis not present

## 2017-10-20 DIAGNOSIS — E039 Hypothyroidism, unspecified: Secondary | ICD-10-CM | POA: Diagnosis not present

## 2017-10-20 DIAGNOSIS — Z1211 Encounter for screening for malignant neoplasm of colon: Secondary | ICD-10-CM | POA: Diagnosis not present

## 2017-10-20 MED ORDER — SYNTHROID 175 MCG PO TABS: ORAL_TABLET | ORAL | 3 refills | Status: DC

## 2017-10-20 MED ORDER — HYDROCHLOROTHIAZIDE 25 MG PO TABS: ORAL_TABLET | ORAL | 3 refills | Status: DC

## 2017-10-20 MED ORDER — LEVOTHYROXINE SODIUM 175 MCG PO TABS: ORAL_TABLET | ORAL | 3 refills | Status: DC

## 2017-10-20 NOTE — Progress Notes (Signed)
Subjective:    Patient ID: Kim Livingston, female    DOB: [birth year], 52 y.o.   MRN: [id number]  HPI Hypertension- Pt denies chest pain, SOB, dizziness, or heart palpitations.  Taking meds as directed w/o problems.  Denies medication side effects.  Did have her cholesterol levels checked through work and says she will get a copy of those to [place].  Hypothyroidism - Taking medication regularly in the AM away from food and vitamins, etc. No recent change to skin, hair, or energy levels.  Last TSH was 1.3 in December.  Also due to follow-up on elevated liver enzymes.  We will recheck her lab work in October of last year ALT was mildly elevated at 42.  Review of Systems     BP (!) 119/52   Pulse 80   Ht 5\' 4"  (1.626 m)   Wt 194 lb (88 kg)   LMP 03/07/2011   SpO2 100%   BMI 33.30 kg/m     Allergies  Allergen Reactions  . Penicillins     REACTION: hives    Past Medical History:  Diagnosis Date  . Abscess of peritoneum ([place])   . Edema   . GERD (gastroesophageal reflux disease)    hx  . Hypertension    borderline  . Hypothyroidism   . PONV (postoperative nausea and vomiting)    used scop patch-did well  . Wears glasses     Past Surgical History:  Procedure Laterality Date  . ABDOMINAL HYSTERECTOMY     Cervix is present.   . ADENOIDECTOMY    . APPENDECTOMY    . BREAST LUMPECTOMY WITH NEEDLE LOCALIZATION Right 05/05/2012   Procedure: BREAST LUMPECTOMY WITH NEEDLE LOCALIZATION;  Surgeon: Adin Hector, MD;  Location: [facility];  Service: General;  Laterality: Right;  . cervical back fusion     Upper back.   . CESAREAN SECTION    . CHOLECYSTECTOMY    . TONSILLECTOMY    . WISDOM TOOTH EXTRACTION      Social History   Socioeconomic History  . Marital status: Married    Spouse name: Mortimer Fries  . Number of children: 1  . Years of education: Not on file  . Highest education level: Not on file  Occupational History  . Occupation: Programme researcher, broadcasting/film/video:  [employer]   Social Needs  . Financial resource strain: Not on file  . Food insecurity:    Worry: Not on file    Inability: Not on file  . Transportation needs:    Medical: Not on file    Non-medical: Not on file  Tobacco Use  . Smoking status: Former Smoker    Last attempt to quit: 08/15/2001    Years since quitting: 16.1  . Smokeless tobacco: Never Used  Substance and Sexual Activity  . Alcohol use: No  . Drug use: No  . Sexual activity: Yes    Partners: Male    Birth control/protection: Surgical    Comment: 1st intercourse- 19, partners- 2, married- 70 yrs   Lifestyle  . Physical activity:    Days per week: Not on file    Minutes per session: Not on file  . Stress: Not on file  Relationships  . Social connections:    Talks on phone: Not on file    Gets together: Not on file    Attends religious service: Not on file    Active member of club or organization: Not on file  Attends meetings of clubs or organizations: Not on file    Relationship status: Not on file  . Intimate partner violence:    Fear of current or ex partner: Not on file    Emotionally abused: Not on file    Physically abused: Not on file    Forced sexual activity: Not on file  Other Topics Concern  . Not on file  Social History Narrative   Some exercise 3-5 days per week, walking. .  2 cup caffeine daily.     Family History  Problem Relation Age of Onset  . Lung cancer Mother   . Lupus Mother   . Cancer Mother        lung  . Heart disease Father        4 vessel bypass, smoker  . Heart failure Father   . Lung cancer Maternal Grandmother   . Stroke Maternal Grandmother   . Bone cancer Paternal Grandfather   . Cancer Maternal Aunt   . Cancer Paternal Uncle        leukemia  . Cancer Paternal Uncle        lung    Outpatient Encounter Medications as of 10/20/2017  Medication Sig  . Azelaic Acid (FINACEA) 15 % cream Apply 1 application topically 2 (two) times daily. After skin  is thoroughly washed and patted dry, gently but thoroughly massage a thin film of azelaic acid cream into the affected area twice daily, in the morning and evening.  . Brimonidine Tartrate (MIRVASO) 0.33 % GEL Apply thin film once daily as directed  . calcium carbonate (TUMS - DOSED IN MG ELEMENTAL CALCIUM) 500 MG chewable tablet Chew 1 tablet by mouth at bedtime.  . hydrochlorothiazide (HYDRODIURIL) 25 MG tablet TAKE 1 TABLET (25 MG TOTAL) BY MOUTH DAILY.  Marland Kitchen levothyroxine (SYNTHROID) 175 MCG tablet TAKE ONE TABLET BY MOUTH DAILY BEFORE BREAKFAST  . Omega-3 Fatty Acids 1200 MG CAPS Take 2,400 mg by mouth 2 (two) times daily.  . Pediatric Multiple Vit-C-FA (PEDIATRIC MULTIVITAMIN) chewable tablet Chew 1 tablet by mouth daily.  . [DISCONTINUED] hydrochlorothiazide (HYDRODIURIL) 25 MG tablet TAKE 1 TABLET (25 MG TOTAL) BY MOUTH DAILY.  . [DISCONTINUED] levothyroxine (SYNTHROID) 175 MCG tablet TAKE ONE TABLET BY MOUTH DAILY BEFORE BREAKFAST  . [DISCONTINUED] cyclobenzaprine (FLEXERIL) 10 MG tablet Take 0.5-1 tablets (5-10 mg total) by mouth at bedtime as needed for muscle spasms.   No facility-administered encounter medications on file as of 10/20/2017.       Objective:   Physical Exam  Constitutional: She is oriented to person, place, and time. She appears well-developed and well-nourished.  HENT:  Head: Normocephalic and atraumatic.  Neck: Neck supple. No thyromegaly present.  Cardiovascular: Normal rate, regular rhythm and normal heart sounds.  Pulmonary/Chest: Effort normal and breath sounds normal.  Neurological: She is alert and oriented to person, place, and time.  Skin: Skin is warm and dry.  Psychiatric: She has a normal mood and affect. Her behavior is normal.          Assessment & Plan:  HTN - Well controlled. Continue current regimen. Follow up in  6 months.  Did have her cholesterol levels checked through work and says she will get a copy of those to [place].   Hypothyroidism  -recheck thyroid level.  She is asymptomatic.  Will adjust dose if needed.  Did refill her branded Synthroid.  New prescription sent to pharmacy.  Elevated liver enzymes-to recheck enzymes.  Declined colonoscopy but is willing to  do Cologuard so we will fax order over today.

## 2017-10-21 ENCOUNTER — Other Ambulatory Visit: Payer: Self-pay | Admitting: *Deleted

## 2017-10-21 DIAGNOSIS — E039 Hypothyroidism, unspecified: Secondary | ICD-10-CM

## 2017-10-21 LAB — COMPLETE METABOLIC PANEL WITH GFR
AG RATIO: 1.6 (calc) (ref 1.0–2.5)
ALT: 39 U/L — AB (ref 6–29)
AST: 28 U/L (ref 10–35)
Albumin: 4.1 g/dL (ref 3.6–5.1)
Alkaline phosphatase (APISO): 60 U/L (ref 33–130)
BUN: 18 mg/dL (ref 7–25)
CALCIUM: 9.6 mg/dL (ref 8.6–10.4)
CHLORIDE: 101 mmol/L (ref 98–110)
CO2: 29 mmol/L (ref 20–32)
Creat: 0.87 mg/dL (ref 0.50–1.05)
GFR, EST NON AFRICAN AMERICAN: 78 mL/min/{1.73_m2} (ref >=60)
GFR, Est African American: 90 mL/min/{1.73_m2} (ref >=60)
Globulin: 2.5 g/dL (calc) (ref 1.9–3.7)
Glucose, Bld: 99 mg/dL (ref 65–139)
POTASSIUM: 4.1 mmol/L (ref 3.5–5.3)
Sodium: 138 mmol/L (ref 135–146)
Total Bilirubin: 0.4 mg/dL (ref 0.2–1.2)
Total Protein: 6.6 g/dL (ref 6.1–8.1)

## 2017-10-21 LAB — TSH: TSH: 0.1 mIU/L — ABNORMAL LOW

## 2017-10-24 ENCOUNTER — Encounter: Payer: Self-pay | Admitting: Family Medicine

## 2017-10-24 LAB — HEMOGLOBIN A1C: HEMOGLOBIN A1C: 5.5

## 2017-12-02 DIAGNOSIS — E039 Hypothyroidism, unspecified: Secondary | ICD-10-CM | POA: Diagnosis not present

## 2017-12-02 LAB — TSH: TSH: 0.09 m[IU]/L — AB

## 2017-12-08 ENCOUNTER — Other Ambulatory Visit: Payer: Self-pay | Admitting: Osteopathic Medicine

## 2017-12-08 MED ORDER — LEVOTHYROXINE SODIUM 150 MCG PO TABS: 150.0000 ug | ORAL_TABLET | Freq: Every day | ORAL | 0 refills | Status: DC

## 2018-01-14 ENCOUNTER — Other Ambulatory Visit: Payer: Self-pay | Admitting: Family Medicine

## 2018-02-01 ENCOUNTER — Other Ambulatory Visit: Payer: Self-pay

## 2018-02-01 ENCOUNTER — Other Ambulatory Visit: Payer: Self-pay | Admitting: Family Medicine

## 2018-02-01 DIAGNOSIS — E039 Hypothyroidism, unspecified: Secondary | ICD-10-CM

## 2018-02-01 NOTE — Progress Notes (Signed)
Orders placed to TSH per patient request.   I called the patient and she states that she will get the cologuard completed in the next week or so. I advised her to ship it soon so the order would not expire. She voices understanding.

## 2018-02-09 ENCOUNTER — Encounter: Payer: Self-pay | Admitting: Obstetrics & Gynecology

## 2018-02-09 ENCOUNTER — Ambulatory Visit: Payer: BLUE CROSS/BLUE SHIELD | Admitting: Obstetrics & Gynecology

## 2018-02-09 VITALS — BP 140/86 | Ht 64.0 in | Wt 195.0 lb

## 2018-02-09 DIAGNOSIS — Z6833 Body mass index (BMI) 33.0-33.9, adult: Secondary | ICD-10-CM

## 2018-02-09 DIAGNOSIS — Z90711 Acquired absence of uterus with remaining cervical stump: Secondary | ICD-10-CM

## 2018-02-09 DIAGNOSIS — E6609 Other obesity due to excess calories: Secondary | ICD-10-CM

## 2018-02-09 DIAGNOSIS — Z01419 Encounter for gynecological examination (general) (routine) without abnormal findings: Secondary | ICD-10-CM

## 2018-02-09 NOTE — Progress Notes (Signed)
Kim Livingston [birth year] [id number]   History:    52 y.o. G1P1L1 married.  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  No vasomotor menopausal symptoms.  No vaginal bleeding.  No pelvic pain.  No pain with intercourse.  Urine and bowel movements normal.  Breasts normal.  Body mass index 33.47.  Health labs with family physician.  Past medical history,surgical history, family history and social history were all reviewed and documented in the EPIC chart.  Gynecologic History Patient's last menstrual period was 03/07/2011. Contraception: Supracervical Hysterectomy Last Pap: 11/2016. Results were: Negative/HPV HR neg Last mammogram: 08/2017. Results were: Probably benign.  Rt Dx mammo/US 02/27/2018. Bone Density: Never Colonoscopy: Planning Cologard or Colonoscopy this year  Obstetric History OB History  Gravida Para Term Preterm AB Living  1 1       1   SAB TAB Ectopic Multiple Live Births               # Outcome Date GA Lbr Len/2nd Weight Sex Delivery Anes PTL Lv  1 Para              ROS: A ROS was performed and pertinent positives and negatives are included in the history.  GENERAL: No fevers or chills. HEENT: No change in vision, no earache, sore throat or sinus congestion. NECK: No pain or stiffness. CARDIOVASCULAR: No chest pain or pressure. No palpitations. PULMONARY: No shortness of breath, cough or wheeze. GASTROINTESTINAL: No abdominal pain, nausea, vomiting or diarrhea, melena or bright red blood per rectum. GENITOURINARY: No urinary frequency, urgency, hesitancy or dysuria. MUSCULOSKELETAL: No joint or muscle pain, no back pain, no recent trauma. DERMATOLOGIC: No rash, no itching, no lesions. ENDOCRINE: No polyuria, polydipsia, no heat or cold intolerance. No recent change in weight. HEMATOLOGICAL: No anemia or easy bruising or bleeding. NEUROLOGIC: No headache, seizures, numbness, tingling  or weakness. PSYCHIATRIC: No depression, no loss of interest in normal activity or change in sleep pattern.     Exam:   BP 140/86   Ht 5\' 4"  (1.626 m)   Wt 195 lb (88.5 kg)   LMP 03/07/2011   BMI 33.47 kg/m   Body mass index is 33.47 kg/m.  General appearance : Well developed well nourished female. No acute distress HEENT: Eyes: no retinal hemorrhage or exudates,  Neck supple, trachea midline, no carotid bruits, no thyroidmegaly Lungs: Clear to auscultation, no rhonchi or wheezes, or rib retractions  Heart: Regular rate and rhythm, no murmurs or gallops Breast:Examined in sitting and supine position were symmetrical in appearance, no palpable masses or tenderness,  no skin retraction, no nipple inversion, no nipple discharge, no skin discoloration, no axillary or supraclavicular lymphadenopathy Abdomen: no palpable masses or tenderness, no rebound or guarding Extremities: no edema or skin discoloration or tenderness  Pelvic: Vulva: Normal             Vagina: No gross lesions or discharge  Cervix: No gross lesions or discharge  Uterus  Absent  Adnexa  Without masses or tenderness  Anus: Normal   Assessment/Plan:  52 y.o. female for annual exam   1. Well female exam with routine gynecological exam Normal gynecologic exam.  Pap test was negative with negative high-risk HPV in October 2018.  Breast exam normal.  Screening mammogram July 2019 was probably benign.  A right diagnostic mammogram and ultrasound are planned for January 2020.  Patient was planning to  do Cologard this year.  Recommend seeing a gastroenterologist and deciding between Cologuard and colonoscopy.  Health labs with family physician.  2. History of hysterectomy, supracervical  3. Class 1 obesity due to excess calories without serious comorbidity with body mass index (BMI) of 33.0 to 33.9 in adult Lower calorie/carb diet such as [employer] recommended.  Aerobic physical activities 5 times a week and  weightlifting every 2 days.  Princess Bruins MD, 4:15 PM 02/09/2018

## 2018-02-09 NOTE — Patient Instructions (Signed)
1. Well female exam with routine gynecological exam Normal gynecologic exam.  Pap test was negative with negative high-risk HPV in October 2018.  Breast exam normal.  Screening mammogram July 2019 was probably benign.  A right diagnostic mammogram and ultrasound are planned for January 2020.  Patient was planning to do Cologard this year.  Recommend seeing a gastroenterologist and deciding between Cologuard and colonoscopy.  Health labs with family physician.  2. History of hysterectomy, supracervical  3. Class 1 obesity due to excess calories without serious comorbidity with body mass index (BMI) of 33.0 to 33.9 in adult Lower calorie/carb diet such as [employer] recommended.  Aerobic physical activities 5 times a week and weightlifting every 2 days.  Langley Gauss, it was a pleasure seeing you today!

## 2018-02-10 DIAGNOSIS — E039 Hypothyroidism, unspecified: Secondary | ICD-10-CM | POA: Diagnosis not present

## 2018-02-10 LAB — TSH: TSH: 0.73 mIU/L

## 2018-02-14 ENCOUNTER — Other Ambulatory Visit: Payer: Self-pay | Admitting: Osteopathic Medicine

## 2018-02-27 DIAGNOSIS — R921 Mammographic calcification found on diagnostic imaging of breast: Secondary | ICD-10-CM | POA: Diagnosis not present

## 2018-02-27 LAB — HM MAMMOGRAPHY

## 2018-03-09 ENCOUNTER — Encounter: Payer: Self-pay | Admitting: Family Medicine

## 2018-04-17 ENCOUNTER — Telehealth: Payer: Self-pay

## 2018-04-17 DIAGNOSIS — E039 Hypothyroidism, unspecified: Secondary | ICD-10-CM

## 2018-04-17 NOTE — Telephone Encounter (Signed)
Langley Gauss called for an order for TSH. Ordered per last result note. Left message advising patient.

## 2018-04-18 ENCOUNTER — Other Ambulatory Visit: Payer: Self-pay | Admitting: Osteopathic Medicine

## 2018-04-18 LAB — TSH: TSH: 1.72 mIU/L

## 2018-04-18 NOTE — Telephone Encounter (Signed)
[facility] not filling rx for Dr Sheppard Coil- routing to rx refill pool

## 2018-04-24 ENCOUNTER — Ambulatory Visit (INDEPENDENT_AMBULATORY_CARE_PROVIDER_SITE_OTHER): Payer: BLUE CROSS/BLUE SHIELD | Admitting: Family Medicine

## 2018-04-24 ENCOUNTER — Encounter: Payer: Self-pay | Admitting: Family Medicine

## 2018-04-24 VITALS — BP 115/53 | HR 64 | Ht 64.0 in | Wt 191.0 lb

## 2018-04-24 DIAGNOSIS — E039 Hypothyroidism, unspecified: Secondary | ICD-10-CM | POA: Diagnosis not present

## 2018-04-24 DIAGNOSIS — I10 Essential (primary) hypertension: Secondary | ICD-10-CM

## 2018-04-24 DIAGNOSIS — Z1211 Encounter for screening for malignant neoplasm of colon: Secondary | ICD-10-CM

## 2018-04-24 DIAGNOSIS — R748 Abnormal levels of other serum enzymes: Secondary | ICD-10-CM | POA: Diagnosis not present

## 2018-04-24 LAB — HEPATIC FUNCTION PANEL
AG Ratio: 1.9 (calc) (ref 1.0–2.5)
ALT: 38 U/L — ABNORMAL HIGH (ref 6–29)
AST: 24 U/L (ref 10–35)
Albumin: 4.3 g/dL (ref 3.6–5.1)
Alkaline phosphatase (APISO): 62 U/L (ref 37–153)
BILIRUBIN DIRECT: 0.1 mg/dL (ref 0.0–0.2)
Globulin: 2.3 g/dL (calc) (ref 1.9–3.7)
Indirect Bilirubin: 0.4 mg/dL (calc) (ref 0.2–1.2)
Total Bilirubin: 0.5 mg/dL (ref 0.2–1.2)
Total Protein: 6.6 g/dL (ref 6.1–8.1)

## 2018-04-24 MED ORDER — LEVOTHYROXINE SODIUM 150 MCG PO TABS: ORAL_TABLET | ORAL | 1 refills | Status: DC

## 2018-04-24 NOTE — Progress Notes (Signed)
Subjective:    CC:   HPI:  Hypertension- Pt denies chest pain, SOB, dizziness, or heart palpitations.  Taking meds as directed w/o problems.  Denies medication side effects.    Hypothyroidism - Taking medication regularly in the AM away from food and vitamins, etc. No recent change to skin, hair, or energy levels.  And her labs in September had asked that she cut her thyroid medicine in 1:30 day a week and take a whole the other 6 days a week.  And plan was to recheck in 6 to 8 weeks.  Last TSH is 1.72.   Elevated liver enzymes-due for six-month recheck.  Her ALT was just mildly elevated and had come back down to 39 back in September.  Past medical history, Surgical history, Family history not pertinant except as noted below, Social history, Allergies, and medications have been entered into the medical record, reviewed, and corrections made.   Review of Systems: No fevers, chills, night sweats, weight loss, chest pain, or shortness of breath.   Objective:    General: Well Developed, well nourished, and in no acute distress.  Neuro: Alert and oriented x3, extra-ocular muscles intact, sensation grossly intact.  HEENT: Normocephalic, atraumatic  Skin: Warm and dry, no rashes. Cardiac: Regular rate and rhythm, no murmurs rubs or gallops, no lower extremity edema.  Respiratory: Clear to auscultation bilaterally. Not using accessory muscles, speaking in full sentences.   Impression and Recommendations:    HTN - Well controlled. Continue current regimen. Follow up in  2months.   Hypothyroidism - last TSH at goal. Well controlled. Continue current regimen. Follow up in  6 months.    Elevated liver enzymes - due to recheck.   Lab slip provided.   Discussed colon cancer screening.  She is okay with moving forward with colonoscopy.

## 2018-04-25 NOTE — Addendum Note (Signed)
Addended by: Beatrice Lecher D on: 04/25/2018 05:09 PM   Modules accepted: Orders

## 2018-05-03 ENCOUNTER — Telehealth: Payer: Self-pay

## 2018-05-03 ENCOUNTER — Encounter: Payer: Self-pay | Admitting: Family Medicine

## 2018-05-03 NOTE — Telephone Encounter (Signed)
Caryl Pina with [facility] called and states they are rescheduling all patients for 4 weeks out. As long if it is ok with ordering provider. She was scheduled for a ultrasound abdomen complete w/elastography. Please advise.

## 2018-05-03 NOTE — Telephone Encounter (Signed)
OK to wait.  Thank you

## 2018-05-04 ENCOUNTER — Encounter: Payer: Self-pay | Admitting: Family Medicine

## 2018-05-04 NOTE — Telephone Encounter (Signed)
Patient and Radiology advised.

## 2018-05-04 NOTE — Telephone Encounter (Signed)
See telephone note. Patient is aware.

## 2018-05-05 ENCOUNTER — Ambulatory Visit (HOSPITAL_COMMUNITY): Payer: BLUE CROSS/BLUE SHIELD

## 2018-05-22 ENCOUNTER — Encounter: Payer: Self-pay | Admitting: Osteopathic Medicine

## 2018-05-22 ENCOUNTER — Other Ambulatory Visit: Payer: Self-pay

## 2018-05-22 ENCOUNTER — Telehealth (INDEPENDENT_AMBULATORY_CARE_PROVIDER_SITE_OTHER): Payer: BLUE CROSS/BLUE SHIELD | Admitting: Osteopathic Medicine

## 2018-05-22 ENCOUNTER — Encounter: Payer: Self-pay | Admitting: Family Medicine

## 2018-05-22 VITALS — BP 120/80 | Temp 97.6°F | Wt 190.2 lb

## 2018-05-22 DIAGNOSIS — R21 Rash and other nonspecific skin eruption: Secondary | ICD-10-CM | POA: Diagnosis not present

## 2018-05-22 MED ORDER — TRIAMCINOLONE ACETONIDE 0.1 % EX CREA: 1.0000 "application " | TOPICAL_CREAM | Freq: Two times a day (BID) | CUTANEOUS | 0 refills | Status: DC

## 2018-05-22 NOTE — Patient Instructions (Signed)
Triamcinolone cream (steroid) Plus anti-eczema lotion / emollient (common ones include Aveeno, Eucerin, CeraVe, Gold Bond)  If no better or if worse/change, please let us know!

## 2018-05-22 NOTE — Progress Notes (Signed)
HPI: Kim Livingston is a 53 y.o. female who  has a past medical history of Abscess of peritoneum ([place]), Edema, GERD (gastroesophageal reflux disease), Hypertension, Hypothyroidism, PONV (postoperative nausea and vomiting), and Wears glasses.  she presents to [facility] today, 05/22/18,  for chief complaint of:  Rash  Rash on eyelids, eval/treated by PCP when pt was here for BP check few weeks ago. OTC cortisone cream w/ vaseline over it has helped, but hasn't gone away. This morning was more irritated. Feels more scaling. No eye involvement, just lids. Able to move eyes in al directions without pain.       At today's visit 05/22/18 ... PMH, PSH, FH reviewed and updated as needed.  Current medication list and allergy/intolerance hx reviewed and updated as needed. (See remainder of HPI, ROS, Phys Exam below)             ASSESSMENT/PLAN: The encounter diagnosis was Rash and nonspecific skin eruption.    Meds ordered this encounter  Medications  . triamcinolone cream (KENALOG) 0.1 %    Sig: Apply 1 application topically 2 (two) times daily. To affected area(s) as needed    Dispense:  15 g    Refill:  0    Patient Instructions  Triamcinolone cream (steroid) Plus anti-eczema lotion / emollient (common ones include Aveeno, Eucerin, CeraVe, Gold Bond)  If no better or if worse/change, please let us know!      Follow-up plan: Return if symptoms worsen or fail to improve.                                                 ################################################# ################################################# ################################################# #################################################    Current Meds  Medication Sig  . Azelaic Acid (FINACEA) 15 % cream Apply 1 application topically 2 (two) times daily. After skin is thoroughly washed and patted dry,  gently but thoroughly massage a thin film of azelaic acid cream into the affected area twice daily, in the morning and evening.  . calcium carbonate (TUMS - DOSED IN MG ELEMENTAL CALCIUM) 500 MG chewable tablet Chew 1 tablet by mouth at bedtime.  . hydrochlorothiazide (HYDRODIURIL) 25 MG tablet TAKE 1 TABLET (25 MG TOTAL) BY MOUTH DAILY.  Marland Kitchen levothyroxine (SYNTHROID) 150 MCG tablet TAKE ONE TABLET BY MOUTH DAILY BEFORE BREAKFAST.  Marland Kitchen MIRVASO 0.33 % GEL APPLY THIN FILM ONCE DAILY AS NEEDED  . Omega-3 Fatty Acids 1200 MG CAPS Take 2,400 mg by mouth 2 (two) times daily.  . Pediatric Multiple Vit-C-FA (PEDIATRIC MULTIVITAMIN) chewable tablet Chew 1 tablet by mouth daily.    Allergies  Allergen Reactions  . Penicillins     REACTION: hives       Review of Systems:  Constitutional: No recent illness, no fever/chills  HEENT: No  headache, no vision change  Cardiac: No  chest pain  Skin: +Rash  Neurologic: No  weakness, No  Dizziness   Exam:  BP 120/80 (Patient Position: Sitting)   Temp 97.6 F (36.4 C) (Oral)   Wt 190 lb 3.2 oz (86.3 kg)   LMP 03/07/2011   BMI 32.65 kg/m   Constitutional: VS see above. General Appearance: alert, well-developed, well-nourished, NAD  Eyes: Normal conjunctive, non-icteric sclera, see photos above re: lid rash   Neck: No masses, trachea midline.   Respiratory: Normal respiratory effort.   Skin: warm,  dry, intact.   Psychiatric: Normal judgment/insight. Normal mood and affect. Oriented x3.       Visit summary with medication list and pertinent instructions was printed for patient to review, patient was advised to alert [place] if any updates are needed. All questions at time of visit were answered - patient instructed to contact office with any additional concerns. ER/RTC precautions were reviewed with the patient and understanding verbalized.     Please note: voice recognition software was used to produce this document, and typos may escape  review. Please contact Dr. Sheppard Coil for any needed clarifications.    Follow up plan: Return if symptoms worsen or fail to improve.

## 2018-06-02 ENCOUNTER — Ambulatory Visit (HOSPITAL_COMMUNITY): Payer: BLUE CROSS/BLUE SHIELD

## 2018-08-28 ENCOUNTER — Ambulatory Visit (HOSPITAL_COMMUNITY)
Admission: RE | Admit: 2018-08-28 | Discharge: 2018-08-28 | Disposition: A | Discharge status: Home / Self Care | Payer: BC Managed Care – PPO | Source: Ambulatory Visit | Attending: Family Medicine | Admitting: Family Medicine

## 2018-08-28 ENCOUNTER — Encounter: Payer: Self-pay | Admitting: Family Medicine

## 2018-08-28 ENCOUNTER — Other Ambulatory Visit: Payer: Self-pay

## 2018-08-28 ENCOUNTER — Other Ambulatory Visit (HOSPITAL_COMMUNITY): Payer: BLUE CROSS/BLUE SHIELD

## 2018-08-28 DIAGNOSIS — Z9049 Acquired absence of other specified parts of digestive tract: Secondary | ICD-10-CM | POA: Diagnosis not present

## 2018-08-28 DIAGNOSIS — R945 Abnormal results of liver function studies: Secondary | ICD-10-CM | POA: Diagnosis not present

## 2018-08-28 DIAGNOSIS — R748 Abnormal levels of other serum enzymes: Secondary | ICD-10-CM | POA: Diagnosis not present

## 2018-08-28 DIAGNOSIS — K760 Fatty (change of) liver, not elsewhere classified: Secondary | ICD-10-CM | POA: Insufficient documentation

## 2018-09-06 DIAGNOSIS — Z1231 Encounter for screening mammogram for malignant neoplasm of breast: Secondary | ICD-10-CM | POA: Diagnosis not present

## 2018-09-06 LAB — HM MAMMOGRAPHY

## 2018-09-15 ENCOUNTER — Encounter: Payer: Self-pay | Admitting: Family Medicine

## 2018-09-25 ENCOUNTER — Encounter: Payer: Self-pay | Admitting: Family Medicine

## 2018-09-25 DIAGNOSIS — E039 Hypothyroidism, unspecified: Secondary | ICD-10-CM

## 2018-09-26 MED ORDER — LEVOTHYROXINE SODIUM 150 MCG PO TABS: ORAL_TABLET | ORAL | 1 refills | Status: DC

## 2018-10-26 ENCOUNTER — Ambulatory Visit: Payer: BLUE CROSS/BLUE SHIELD | Admitting: Family Medicine

## 2018-11-14 ENCOUNTER — Ambulatory Visit: Payer: BC Managed Care – PPO | Admitting: Family Medicine

## 2018-11-16 ENCOUNTER — Other Ambulatory Visit: Payer: Self-pay

## 2018-11-16 ENCOUNTER — Ambulatory Visit (INDEPENDENT_AMBULATORY_CARE_PROVIDER_SITE_OTHER): Payer: BC Managed Care – PPO | Admitting: Family Medicine

## 2018-11-16 ENCOUNTER — Encounter: Payer: Self-pay | Admitting: Family Medicine

## 2018-11-16 VITALS — BP 130/63 | HR 78 | Ht 66.0 in | Wt 194.0 lb

## 2018-11-16 DIAGNOSIS — I10 Essential (primary) hypertension: Secondary | ICD-10-CM | POA: Diagnosis not present

## 2018-11-16 DIAGNOSIS — E039 Hypothyroidism, unspecified: Secondary | ICD-10-CM

## 2018-11-16 DIAGNOSIS — M79672 Pain in left foot: Secondary | ICD-10-CM

## 2018-11-16 DIAGNOSIS — M79671 Pain in right foot: Secondary | ICD-10-CM | POA: Diagnosis not present

## 2018-11-16 MED ORDER — HYDROCHLOROTHIAZIDE 25 MG PO TABS: ORAL_TABLET | ORAL | 3 refills | Status: DC

## 2018-11-16 NOTE — Assessment & Plan Note (Signed)
Due to recheck TSH.  She will try to get the lab today.

## 2018-11-16 NOTE — Progress Notes (Signed)
Established Patient Office Visit  Subjective:  Patient ID: Kim Livingston, female    DOB: Jul 09, 1965  Age: 53 y.o. MRN: [id number]  CC:  Chief Complaint  Patient presents with  . Hypertension    HPI Kim Livingston presents for  Bilateral foot pain.  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  She says some days she will sit for extended periods of time and other day she will be on her feet for 12 hours and that is when it started really bothering her.  The pain is mostly over the tops of her feet and over the midfoot area medially and laterally.  It is not localized to just one area.  She says she even bought new pairs of tennis shoes and quit wearing her dress shoes.  She does take Advil it does help but she is try to be careful about how often she is using it.  Some days it does not bother her much at all.  She has been doing ice packs and Epson salt soaks.  Once in a while she will feel a little bit of pinprick sensation on the bottoms of her feet.  She says occasionally her feet will look swollen.  But no swelling in the ankles or lower legs.  No family history or prior history of gout. No known trauma or injury.    Hypertension- Pt denies chest pain, SOB, dizziness, or heart palpitations.  Taking meds as directed w/o problems.  Denies medication side effects.    Hypothyroidism - Taking medication regularly in the AM away from food and vitamins, etc. No recent change to skin, hair, or energy levels.    Past Medical History:  Diagnosis Date  . Abscess of peritoneum ([place])   . Edema   . GERD (gastroesophageal reflux disease)    hx  . Hypertension    borderline  . Hypothyroidism   . PONV (postoperative nausea and vomiting)    used scop patch-did well  . Wears glasses     Past Surgical History:  Procedure Laterality Date  . ABDOMINAL HYSTERECTOMY     Cervix is present.   . ADENOIDECTOMY    .  APPENDECTOMY    . BREAST LUMPECTOMY WITH NEEDLE LOCALIZATION Right 05/05/2012   Procedure: BREAST LUMPECTOMY WITH NEEDLE LOCALIZATION;  Surgeon: Adin Hector, MD;  Location: [facility];  Service: General;  Laterality: Right;  . cervical back fusion     Upper back.   . CESAREAN SECTION    . CHOLECYSTECTOMY    . TONSILLECTOMY    . WISDOM TOOTH EXTRACTION      Family History  Problem Relation Age of Onset  . Lung cancer Mother   . Lupus Mother   . Cancer Mother        lung  . Heart disease Father        4 vessel bypass, smoker  . Heart failure Father   . Lung cancer Maternal Grandmother   . Stroke Maternal Grandmother   . Bone cancer Paternal Grandfather   . Cancer Maternal Aunt   . Cancer Paternal Uncle        leukemia  . Cancer Paternal Uncle        lung    Social History   Socioeconomic History  . Marital status: Married    Spouse name: Mortimer Fries  . Number of children: 1  . Years of  education: Not on file  . Highest education level: Not on file  Occupational History  . Occupation: Programme researcher, broadcasting/film/video: [employer]   Social Needs  . Financial resource strain: Not on file  . Food insecurity    Worry: Not on file    Inability: Not on file  . Transportation needs    Medical: Not on file    Non-medical: Not on file  Tobacco Use  . Smoking status: Former Smoker    Quit date: 08/15/2001    Years since quitting: 17.2  . Smokeless tobacco: Never Used  Substance and Sexual Activity  . Alcohol use: No  . Drug use: No  . Sexual activity: Yes    Partners: Male    Birth control/protection: Surgical    Comment: 1st intercourse- 19, partners- 2, married- 1 yrs   Lifestyle  . Physical activity    Days per week: Not on file    Minutes per session: Not on file  . Stress: Not on file  Relationships  . Social Herbalist on phone: Not on file    Gets together: Not on file    Attends religious service: Not on file    Active member  of club or organization: Not on file    Attends meetings of clubs or organizations: Not on file    Relationship status: Not on file  . Intimate partner violence    Fear of current or ex partner: Not on file    Emotionally abused: Not on file    Physically abused: Not on file    Forced sexual activity: Not on file  Other Topics Concern  . Not on file  Social History Narrative   Some exercise 3-5 days per week, walking. .  2 cup caffeine daily.     Outpatient Medications Prior to Visit  Medication Sig Dispense Refill  . Azelaic Acid (FINACEA) 15 % cream Apply 1 application topically 2 (two) times daily. After skin is thoroughly washed and patted dry, gently but thoroughly massage a thin film of azelaic acid cream into the affected area twice daily, in the morning and evening. 50 g PRN  . calcium carbonate (TUMS - DOSED IN MG ELEMENTAL CALCIUM) 500 MG chewable tablet Chew 1 tablet by mouth at bedtime.    Marland Kitchen levothyroxine (SYNTHROID) 150 MCG tablet TAKE ONE TABLET BY MOUTH DAILY BEFORE BREAKFAST. 90 tablet 1  . MIRVASO 0.33 % GEL APPLY THIN FILM ONCE DAILY AS NEEDED 30 g PRN  . Omega-3 Fatty Acids 1200 MG CAPS Take 2,400 mg by mouth 2 (two) times daily.    . Pediatric Multiple Vit-C-FA (PEDIATRIC MULTIVITAMIN) chewable tablet Chew 1 tablet by mouth daily.    Marland Kitchen triamcinolone cream (KENALOG) 0.1 % Apply 1 application topically 2 (two) times daily. To affected area(s) as needed 15 g 0  . hydrochlorothiazide (HYDRODIURIL) 25 MG tablet TAKE 1 TABLET (25 MG TOTAL) BY MOUTH DAILY. 90 tablet 3   No facility-administered medications prior to visit.     Allergies  Allergen Reactions  . Penicillins     REACTION: hives    ROS Review of Systems    Objective:    Physical Exam  Constitutional: She is oriented to person, place, and time. She appears well-developed and well-nourished.  HENT:  Head: Normocephalic and atraumatic.  Cardiovascular: Normal rate, regular rhythm and normal heart  sounds.  Pulmonary/Chest: Effort normal and breath sounds normal.  Musculoskeletal:  Comments: Both feet was normal appearance is little swelling over the distal foot bilaterally.  Ankles with normal range of motion.  Strength is 5-5 in all directions.  Also pedal pulses 1+ bilaterally.  No significant erythema or swelling over the joints.  Nontender over the metatarsals or midfoot or heel.  Neurological: She is alert and oriented to person, place, and time.  Skin: Skin is warm and dry.  Psychiatric: She has a normal mood and affect. Her behavior is normal.    BP 130/63   Pulse 78   Ht 5\' 6"  (1.676 m)   Wt 194 lb (88 kg)   LMP 03/07/2011   SpO2 99%   BMI 31.31 kg/m  Wt Readings from Last 3 Encounters:  11/16/18 194 lb (88 kg)  05/22/18 190 lb 3.2 oz (86.3 kg)  04/24/18 191 lb (86.6 kg)     There are no preventive care reminders to display for this patient.  There are no preventive care reminders to display for this patient.  Lab Results  Component Value Date   TSH 1.72 04/17/2018   Lab Results  Component Value Date   WBC 9.5 11/11/2011   HGB 16.0 (H) 05/05/2012   HCT 41.4 11/11/2011   MCV 90.6 11/11/2011   PLT 271 11/11/2011   Lab Results  Component Value Date   NA 138 10/20/2017   K 4.1 10/20/2017   CO2 29 10/20/2017   GLUCOSE 99 10/20/2017   BUN 18 10/20/2017   CREATININE 0.87 10/20/2017   BILITOT 0.5 04/24/2018   ALKPHOS 62 03/25/2016   AST 24 04/24/2018   ALT 38 (H) 04/24/2018   PROT 6.6 04/24/2018   ALBUMIN 4.5 03/25/2016   CALCIUM 9.6 10/20/2017   Lab Results  Component Value Date   CHOL 166 06/13/2017   Lab Results  Component Value Date   HDL 37 06/13/2017   Lab Results  Component Value Date   LDLCALC 108 06/13/2017   Lab Results  Component Value Date   TRIG 104 06/13/2017   Lab Results  Component Value Date   CHOLHDL 4.2 03/31/2015   Lab Results  Component Value Date   HGBA1C 5.5 06/13/2017      Assessment & Plan:    Problem List Items Addressed This Visit      Cardiovascular and Mediastinum   Essential hypertension - Primary    Well controlled. Continue current regimen. Follow up in  6 mo      Relevant Medications   hydrochlorothiazide (HYDRODIURIL) 25 MG tablet   Other Relevant Orders   CBC   COMPLETE METABOLIC PANEL WITH GFR   Lipid panel   TSH     Endocrine   Hypothyroid    Due to recheck TSH.  She will try to get the lab today.      Relevant Orders   TSH     Other   Pain in both feet    Bilateral foot pain.  Mostly over the top of the midfoot.  No pain in the arch or over the heel or Achilles area.  No pain at the distal ends of the foot or toes.  No significant erythema or rash.  She does have a little bit of swelling over the distal foot bilaterally.  Nontender over the metatarsal heads.  Nontender over the midfoot or over the arch or heel.  Recommend arch supports to provide a little extra support to the midfoot.  If not improving over the next 2 weeks then recommend get in  with sports medicine for further evaluation         Meds ordered this encounter  Medications  . DISCONTD: hydrochlorothiazide (HYDRODIURIL) 25 MG tablet    Sig: TAKE 1 TABLET (25 MG TOTAL) BY MOUTH DAILY.    Dispense:  90 tablet    Refill:  3  . hydrochlorothiazide (HYDRODIURIL) 25 MG tablet    Sig: TAKE 1 TABLET (25 MG TOTAL) BY MOUTH DAILY.    Dispense:  90 tablet    Refill:  3    Follow-up: Return in about 6 months (around 05/17/2019) for Hypertension.    Beatrice Lecher, MD

## 2018-11-16 NOTE — Assessment & Plan Note (Signed)
Bilateral foot pain.  Mostly over the top of the midfoot.  No pain in the arch or over the heel or Achilles area.  No pain at the distal ends of the foot or toes.  No significant erythema or rash.  She does have a little bit of swelling over the distal foot bilaterally.  Nontender over the metatarsal heads.  Nontender over the midfoot or over the arch or heel.  Recommend arch supports to provide a little extra support to the midfoot.  If not improving over the next 2 weeks then recommend get in with sports medicine for further evaluation

## 2018-11-16 NOTE — Assessment & Plan Note (Signed)
Well controlled. Continue current regimen. Follow up in  6 mo  

## 2018-11-17 ENCOUNTER — Encounter: Payer: Self-pay | Admitting: Family Medicine

## 2018-11-17 DIAGNOSIS — E039 Hypothyroidism, unspecified: Secondary | ICD-10-CM

## 2018-11-17 LAB — LIPID PANEL
Cholesterol: 199 mg/dL (ref <200)
HDL: 46 mg/dL — ABNORMAL LOW (ref >=50)
LDL Cholesterol (Calc): 116 mg/dL (calc) — ABNORMAL HIGH
Non-HDL Cholesterol (Calc): 153 mg/dL (calc) — ABNORMAL HIGH (ref <130)
Total CHOL/HDL Ratio: 4.3 (calc) (ref <5.0)
Triglycerides: 260 mg/dL — ABNORMAL HIGH (ref <150)

## 2018-11-17 LAB — COMPLETE METABOLIC PANEL WITH GFR
AG Ratio: 1.7 (calc) (ref 1.0–2.5)
ALT: 49 U/L — ABNORMAL HIGH (ref 6–29)
AST: 30 U/L (ref 10–35)
Albumin: 4.4 g/dL (ref 3.6–5.1)
Alkaline phosphatase (APISO): 59 U/L (ref 37–153)
BUN: 18 mg/dL (ref 7–25)
CO2: 28 mmol/L (ref 20–32)
Calcium: 10.1 mg/dL (ref 8.6–10.4)
Chloride: 102 mmol/L (ref 98–110)
Creat: 0.9 mg/dL (ref 0.50–1.05)
GFR, Est African American: 86 mL/min/{1.73_m2} (ref >=60)
GFR, Est Non African American: 74 mL/min/{1.73_m2} (ref >=60)
Globulin: 2.6 g/dL (calc) (ref 1.9–3.7)
Glucose, Bld: 95 mg/dL (ref 65–99)
Potassium: 3.9 mmol/L (ref 3.5–5.3)
Sodium: 140 mmol/L (ref 135–146)
Total Bilirubin: 0.4 mg/dL (ref 0.2–1.2)
Total Protein: 7 g/dL (ref 6.1–8.1)

## 2018-11-17 LAB — TSH: TSH: 0.29 mIU/L — ABNORMAL LOW

## 2018-11-17 LAB — CBC
HCT: 43.6 % (ref 35.0–45.0)
Hemoglobin: 15 g/dL (ref 11.7–15.5)
MCH: 31.1 pg (ref 27.0–33.0)
MCHC: 34.4 g/dL (ref 32.0–36.0)
MCV: 90.3 fL (ref 80.0–100.0)
MPV: 11.6 fL (ref 7.5–12.5)
Platelets: 306 10*3/uL (ref 140–400)
RBC: 4.83 10*6/uL (ref 3.80–5.10)
RDW: 12.7 % (ref 11.0–15.0)
WBC: 7 10*3/uL (ref 3.8–10.8)

## 2018-11-17 MED ORDER — LEVOTHYROXINE SODIUM 137 MCG PO TABS: 137.0000 ug | ORAL_TABLET | Freq: Every day | ORAL | 1 refills | Status: DC

## 2018-11-18 DIAGNOSIS — Z03818 Encounter for observation for suspected exposure to other biological agents ruled out: Secondary | ICD-10-CM | POA: Diagnosis not present

## 2018-12-22 ENCOUNTER — Encounter: Payer: Self-pay | Admitting: Family Medicine

## 2018-12-22 ENCOUNTER — Other Ambulatory Visit: Payer: Self-pay | Admitting: *Deleted

## 2018-12-22 DIAGNOSIS — E039 Hypothyroidism, unspecified: Secondary | ICD-10-CM

## 2018-12-27 DIAGNOSIS — E039 Hypothyroidism, unspecified: Secondary | ICD-10-CM | POA: Diagnosis not present

## 2018-12-28 LAB — TSH: TSH: 1.12 mIU/L

## 2019-01-08 DIAGNOSIS — Z03818 Encounter for observation for suspected exposure to other biological agents ruled out: Secondary | ICD-10-CM | POA: Diagnosis not present

## 2019-01-15 ENCOUNTER — Other Ambulatory Visit: Payer: Self-pay | Admitting: Family Medicine

## 2019-01-15 DIAGNOSIS — E039 Hypothyroidism, unspecified: Secondary | ICD-10-CM

## 2019-02-15 DIAGNOSIS — Z03818 Encounter for observation for suspected exposure to other biological agents ruled out: Secondary | ICD-10-CM | POA: Diagnosis not present

## 2019-02-19 ENCOUNTER — Other Ambulatory Visit: Payer: Self-pay | Admitting: Family Medicine

## 2019-02-19 DIAGNOSIS — E039 Hypothyroidism, unspecified: Secondary | ICD-10-CM

## 2019-02-24 DIAGNOSIS — Z03818 Encounter for observation for suspected exposure to other biological agents ruled out: Secondary | ICD-10-CM | POA: Diagnosis not present

## 2019-03-05 DIAGNOSIS — Z03818 Encounter for observation for suspected exposure to other biological agents ruled out: Secondary | ICD-10-CM | POA: Diagnosis not present

## 2019-03-24 ENCOUNTER — Other Ambulatory Visit: Payer: Self-pay | Admitting: Family Medicine

## 2019-03-24 DIAGNOSIS — E039 Hypothyroidism, unspecified: Secondary | ICD-10-CM

## 2019-04-23 ENCOUNTER — Other Ambulatory Visit: Payer: Self-pay | Admitting: Family Medicine

## 2019-04-23 DIAGNOSIS — E039 Hypothyroidism, unspecified: Secondary | ICD-10-CM

## 2019-04-26 ENCOUNTER — Other Ambulatory Visit: Payer: Self-pay | Admitting: Family Medicine

## 2019-04-26 DIAGNOSIS — E039 Hypothyroidism, unspecified: Secondary | ICD-10-CM

## 2019-04-27 ENCOUNTER — Other Ambulatory Visit: Payer: Self-pay | Admitting: Family Medicine

## 2019-04-27 DIAGNOSIS — E039 Hypothyroidism, unspecified: Secondary | ICD-10-CM

## 2019-04-28 ENCOUNTER — Encounter: Payer: Self-pay | Admitting: Family Medicine

## 2019-04-28 DIAGNOSIS — E039 Hypothyroidism, unspecified: Secondary | ICD-10-CM

## 2019-04-30 MED ORDER — SYNTHROID 137 MCG PO TABS: 137.0000 ug | ORAL_TABLET | Freq: Every day | ORAL | 1 refills | Status: DC

## 2019-05-29 ENCOUNTER — Encounter: Payer: Self-pay | Admitting: Family Medicine

## 2019-05-29 DIAGNOSIS — E039 Hypothyroidism, unspecified: Secondary | ICD-10-CM

## 2019-05-29 MED ORDER — SYNTHROID 137 MCG PO TABS: 137.0000 ug | ORAL_TABLET | Freq: Every day | ORAL | 0 refills | Status: DC

## 2019-06-14 ENCOUNTER — Ambulatory Visit (INDEPENDENT_AMBULATORY_CARE_PROVIDER_SITE_OTHER): Payer: BC Managed Care – PPO | Admitting: Family Medicine

## 2019-06-14 ENCOUNTER — Encounter: Payer: Self-pay | Admitting: Family Medicine

## 2019-06-14 ENCOUNTER — Other Ambulatory Visit: Payer: Self-pay

## 2019-06-14 VITALS — BP 123/62 | HR 76 | Ht 66.0 in | Wt 195.0 lb

## 2019-06-14 DIAGNOSIS — E039 Hypothyroidism, unspecified: Secondary | ICD-10-CM

## 2019-06-14 DIAGNOSIS — Z1211 Encounter for screening for malignant neoplasm of colon: Secondary | ICD-10-CM

## 2019-06-14 DIAGNOSIS — I10 Essential (primary) hypertension: Secondary | ICD-10-CM

## 2019-06-14 LAB — TSH: TSH: 9.28 mIU/L — ABNORMAL HIGH

## 2019-06-14 NOTE — Progress Notes (Signed)
Established Patient Office Visit  Subjective:  Patient ID: Kim Livingston, female    DOB: March 16, 1965  Age: 54 y.o. MRN: [id number]  CC:  Chief Complaint  Patient presents with  . Hypertension    HPI KENOSHA GONCE presents for 6 mo f/u   Hypertension- Pt denies chest pain, SOB, dizziness, or heart palpitations.  Taking meds as directed w/o problems.  Denies medication side effects.   Hypothyroidism - Taking medication regularly in the AM away from food and vitamins, etc. No recent change to skin, hair, or energy levels.    Past Medical History:  Diagnosis Date  . Abscess of peritoneum ([place])   . Edema   . GERD (gastroesophageal reflux disease)    hx  . Hypertension    borderline  . Hypothyroidism   . PONV (postoperative nausea and vomiting)    used scop patch-did well  . Wears glasses     Past Surgical History:  Procedure Laterality Date  . ABDOMINAL HYSTERECTOMY     Cervix is present.   . ADENOIDECTOMY    . APPENDECTOMY    . BREAST LUMPECTOMY WITH NEEDLE LOCALIZATION Right 05/05/2012   Procedure: BREAST LUMPECTOMY WITH NEEDLE LOCALIZATION;  Surgeon: Adin Hector, MD;  Location: [facility];  Service: General;  Laterality: Right;  . cervical back fusion     Upper back.   . CESAREAN SECTION    . CHOLECYSTECTOMY    . TONSILLECTOMY    . WISDOM TOOTH EXTRACTION      Family History  Problem Relation Age of Onset  . Lung cancer Mother   . Lupus Mother   . Cancer Mother        lung  . Heart disease Father        4 vessel bypass, smoker  . Heart failure Father   . Lung cancer Maternal Grandmother   . Stroke Maternal Grandmother   . Bone cancer Paternal Grandfather   . Cancer Maternal Aunt   . Cancer Paternal Uncle        leukemia  . Cancer Paternal Uncle        lung    Social History   Socioeconomic History  . Marital status: Married    Spouse name: Mortimer Fries  . Number of children: 1  . Years of education: Not on file  . Highest  education level: Not on file  Occupational History  . Occupation: Programme researcher, broadcasting/film/video: [employer]   Tobacco Use  . Smoking status: Former Smoker    Quit date: 08/15/2001    Years since quitting: 17.8  . Smokeless tobacco: Never Used  Substance and Sexual Activity  . Alcohol use: No  . Drug use: No  . Sexual activity: Yes    Partners: Male    Birth control/protection: Surgical    Comment: 1st intercourse- 19, partners- 2, married- 62 yrs   Other Topics Concern  . Not on file  Social History Narrative   Some exercise 3-5 days per week, walking. .  2 cup caffeine daily.    Social Determinants of Health   Financial Resource Strain:   . Difficulty of Paying Living Expenses:   Food Insecurity:   . Worried About Charity fundraiser in the Last Year:   . Arboriculturist in the Last Year:   Transportation Needs:   . Film/video editor (Medical):   Marland Kitchen Lack of Transportation (Non-Medical):   Physical Activity:   .  Days of Exercise per Week:   . Minutes of Exercise per Session:   Stress:   . Feeling of Stress :   Social Connections:   . Frequency of Communication with Friends and Family:   . Frequency of Social Gatherings with Friends and Family:   . Attends Religious Services:   . Active Member of Clubs or Organizations:   . Attends Archivist Meetings:   Marland Kitchen Marital Status:   Intimate Partner Violence:   . Fear of Current or Ex-Partner:   . Emotionally Abused:   Marland Kitchen Physically Abused:   . Sexually Abused:     Outpatient Medications Prior to Visit  Medication Sig Dispense Refill  . hydrochlorothiazide (HYDRODIURIL) 25 MG tablet TAKE 1 TABLET (25 MG TOTAL) BY MOUTH DAILY. 90 tablet 3  . MIRVASO 0.33 % GEL APPLY THIN FILM ONCE DAILY AS NEEDED 30 g PRN  . Omega-3 Fatty Acids 1200 MG CAPS Take 2,400 mg by mouth 2 (two) times daily.    . Pediatric Multiple Vit-C-FA (PEDIATRIC MULTIVITAMIN) chewable tablet Chew 1 tablet by mouth daily.    Marland Kitchen SYNTHROID  137 MCG tablet Take 1 tablet (137 mcg total) by mouth daily with breakfast. 30 tablet 0  . triamcinolone cream (KENALOG) 0.1 % Apply 1 application topically 2 (two) times daily. To affected area(s) as needed 15 g 0  . Azelaic Acid (FINACEA) 15 % cream Apply 1 application topically 2 (two) times daily. After skin is thoroughly washed and patted dry, gently but thoroughly massage a thin film of azelaic acid cream into the affected area twice daily, in the morning and evening. 50 g PRN  . calcium carbonate (TUMS - DOSED IN MG ELEMENTAL CALCIUM) 500 MG chewable tablet Chew 1 tablet by mouth at bedtime.     No facility-administered medications prior to visit.    Allergies  Allergen Reactions  . Penicillins     REACTION: hives    ROS Review of Systems    Objective:    Physical Exam  Constitutional: She is oriented to person, place, and time. She appears well-developed and well-nourished.  HENT:  Head: Normocephalic and atraumatic.  Cardiovascular: Normal rate, regular rhythm and normal heart sounds.  Pulmonary/Chest: Effort normal and breath sounds normal.  Neurological: She is alert and oriented to person, place, and time.  Skin: Skin is warm and dry.  Psychiatric: She has a normal mood and affect. Her behavior is normal.    BP 123/62   Pulse 76   Ht 5\' 6"  (1.676 m)   Wt 195 lb (88.5 kg)   LMP 03/07/2011   SpO2 98%   BMI 31.47 kg/m  Wt Readings from Last 3 Encounters:  06/14/19 195 lb (88.5 kg)  11/16/18 194 lb (88 kg)  05/22/18 190 lb 3.2 oz (86.3 kg)     Health Maintenance Due  Topic Date Due  . COLONOSCOPY  Never done    There are no preventive care reminders to display for this patient.  Lab Results  Component Value Date   TSH 1.12 12/27/2018   Lab Results  Component Value Date   WBC 7.0 11/16/2018   HGB 15.0 11/16/2018   HCT 43.6 11/16/2018   MCV 90.3 11/16/2018   PLT 306 11/16/2018   Lab Results  Component Value Date   NA 140 11/16/2018   K 3.9  11/16/2018   CO2 28 11/16/2018   GLUCOSE 95 11/16/2018   BUN 18 11/16/2018   CREATININE 0.90 11/16/2018   BILITOT 0.4  11/16/2018   ALKPHOS 62 03/25/2016   AST 30 11/16/2018   ALT 49 (H) 11/16/2018   PROT 7.0 11/16/2018   ALBUMIN 4.5 03/25/2016   CALCIUM 10.1 11/16/2018   Lab Results  Component Value Date   CHOL 199 11/16/2018   Lab Results  Component Value Date   HDL 46 (L) 11/16/2018   Lab Results  Component Value Date   LDLCALC 116 (H) 11/16/2018   Lab Results  Component Value Date   TRIG 260 (H) 11/16/2018   Lab Results  Component Value Date   CHOLHDL 4.3 11/16/2018   Lab Results  Component Value Date   HGBA1C 5.5 06/13/2017      Assessment & Plan:   Problem List Items Addressed This Visit      Cardiovascular and Mediastinum   Essential hypertension - Primary    Well controlled. Continue current regimen. Follow up in  73m months.         Endocrine   Hypothyroid    Due to recheck labs. She will go today. ASymtpomatic.        Other Visit Diagnoses    Screen for colon cancer       Relevant Orders   Ambulatory referral to Gastroenterology      No orders of the defined types were placed in this encounter.   Follow-up: Return in about 6 months (around 12/14/2019) for Hypertension.    Beatrice Lecher, MD

## 2019-06-14 NOTE — Assessment & Plan Note (Signed)
Due to recheck labs. She will go today. ASymtpomatic.

## 2019-06-14 NOTE — Assessment & Plan Note (Signed)
Well controlled. Continue current regimen. Follow up in  67m months.

## 2019-06-15 ENCOUNTER — Encounter: Payer: Self-pay | Admitting: Family Medicine

## 2019-06-15 DIAGNOSIS — E039 Hypothyroidism, unspecified: Secondary | ICD-10-CM

## 2019-06-15 MED ORDER — LISINOPRIL-HYDROCHLOROTHIAZIDE 20-25 MG PO TABS: 1.0000 | ORAL_TABLET | Freq: Every day | ORAL | 1 refills | Status: DC

## 2019-06-15 MED ORDER — SYNTHROID 137 MCG PO TABS: 137.0000 ug | ORAL_TABLET | Freq: Every day | ORAL | 0 refills | Status: DC

## 2019-06-15 NOTE — Telephone Encounter (Signed)
Med sent to pharmacy.

## 2019-06-15 NOTE — Addendum Note (Signed)
Addended by: Beatrice Lecher D on: 06/15/2019 12:44 PM   Modules accepted: Orders

## 2019-07-20 ENCOUNTER — Encounter: Payer: Self-pay | Admitting: Gastroenterology

## 2019-08-23 ENCOUNTER — Other Ambulatory Visit: Payer: Self-pay

## 2019-08-23 ENCOUNTER — Ambulatory Visit (AMBULATORY_SURGERY_CENTER): Payer: Self-pay | Admitting: *Deleted

## 2019-08-23 VITALS — Ht 66.0 in | Wt 198.0 lb

## 2019-08-23 DIAGNOSIS — Z1211 Encounter for screening for malignant neoplasm of colon: Secondary | ICD-10-CM

## 2019-08-23 MED ORDER — SUTAB 1479-225-188 MG PO TABS: 1.0000 | ORAL_TABLET | ORAL | 0 refills | Status: DC

## 2019-08-23 NOTE — Progress Notes (Signed)
Patient is here in-person for PV. Patient denies any allergies to eggs or soy. Patient denies any problems with anesthesia/sedation. PONV. Patient denies any oxygen use at home. Patient denies taking any diet/weight loss medications or blood thinners. Patient is not being treated for MRSA or C-diff. Patient is aware of our care-partner policy and [id number] safety protocol. EMMI education assisgned to the patient for the procedure, this was explained and instructions given to patient.   COVID-19 vaccines completed 03/22/19 per pt. Prep Prescription coupon given to the patient.

## 2019-08-24 ENCOUNTER — Encounter: Payer: Self-pay | Admitting: Gastroenterology

## 2019-09-06 ENCOUNTER — Encounter: Payer: Self-pay | Admitting: Gastroenterology

## 2019-09-06 ENCOUNTER — Other Ambulatory Visit: Payer: Self-pay

## 2019-09-06 ENCOUNTER — Ambulatory Visit (AMBULATORY_SURGERY_CENTER): Payer: Managed Care, Other (non HMO) | Admitting: Gastroenterology

## 2019-09-06 VITALS — BP 106/64 | HR 63 | Temp 97.8°F | Resp 12 | Ht 66.0 in | Wt 198.0 lb

## 2019-09-06 DIAGNOSIS — D123 Benign neoplasm of transverse colon: Secondary | ICD-10-CM

## 2019-09-06 DIAGNOSIS — D120 Benign neoplasm of cecum: Secondary | ICD-10-CM

## 2019-09-06 DIAGNOSIS — K635 Polyp of colon: Secondary | ICD-10-CM | POA: Diagnosis not present

## 2019-09-06 DIAGNOSIS — Z1211 Encounter for screening for malignant neoplasm of colon: Secondary | ICD-10-CM | POA: Diagnosis present

## 2019-09-06 MED ORDER — SODIUM CHLORIDE 0.9 % IV SOLN
500.0000 mL | Freq: Once | INTRAVENOUS | Status: DC
Start: 2019-09-06 — End: 2019-09-06

## 2019-09-06 NOTE — Progress Notes (Signed)
Called to room to assist during endoscopic procedure.  Patient ID and intended procedure confirmed with present staff. Received instructions for my participation in the procedure from the performing physician.  

## 2019-09-06 NOTE — Progress Notes (Signed)
PT taken to PACU. Monitors in place. VSS. Report given to RN. 

## 2019-09-06 NOTE — Op Note (Signed)
[place] Patient Name: Kim Livingston Procedure Date: 09/06/2019 10:42 AM MRN: [id number] Endoscopist: Thornton Park MD, MD Age: 54 Referring MD:  Date of Birth: 01/10/1966 Gender: Female Account #: [id number] Procedure:                Colonoscopy Indications:              Screening for colorectal malignant neoplasm, This                            is the patient's first colonoscopy                           No known family history of colon cancer or polyps Medicines:                Monitored Anesthesia Care Procedure:                Pre-Anesthesia Assessment:                           - Prior to the procedure, a History and Physical                            was performed, and patient medications and                            allergies were reviewed. The patient's tolerance of                            previous anesthesia was also reviewed. The risks                            and benefits of the procedure and the sedation                            options and risks were discussed with the patient.                            All questions were answered, and informed consent                            was obtained. Prior Anticoagulants: The patient has                            taken no previous anticoagulant or antiplatelet                            agents. ASA Grade Assessment: II - A patient with                            mild systemic disease. After reviewing the risks                            and benefits, the patient was deemed in  satisfactory condition to undergo the procedure.                           After obtaining informed consent, the colonoscope                            was passed under direct vision. Throughout the                            procedure, the patient's blood pressure, pulse, and                            oxygen saturations were monitored continuously. The                            Colonoscope was  introduced through the anus and                            advanced to the 5 cm into the ileum. The                            colonoscopy was performed without difficulty. The                            patient tolerated the procedure well. The quality                            of the bowel preparation was good. The terminal                            ileum, ileocecal valve, appendiceal orifice, and                            rectum were photographed. Scope In: 10:50:09 AM Scope Out: 11:03:06 AM Scope Withdrawal Time: 0 hours 10 minutes 27 seconds  Total Procedure Duration: 0 hours 12 minutes 57 seconds  Findings:                 The perianal and digital rectal examinations were                            normal.                           A 2-3 mm polyp was found in the splenic flexure.                            The polyp was flat. The polyp was removed with a                            cold snare. Resection and retrieval were complete.                            Estimated blood loss was minimal.  A localized area of mucosa in the distal ileum was                            nodular. Biopsies were taken with a cold forceps                            for histology. Estimated blood loss was minimal.                           The exam was otherwise without abnormality on                            direct and retroflexion views except for small                            internal hemorrhoids. Complications:            No immediate complications. Estimated blood loss:                            Minimal. Estimated Blood Loss:     Estimated blood loss was minimal. Impression:               - One 3 mm polyp at the splenic flexure, removed                            with a cold snare. Resected and retrieved.                           - Nodular ileal mucosa. Biopsied.                           - The examination was otherwise normal on direct                             and retroflexion views. Recommendation:           - Patient has a contact number available for                            emergencies. The signs and symptoms of potential                            delayed complications were discussed with the                            patient. Return to normal activities tomorrow.                            Written discharge instructions were provided to the                            patient.                           - Resume previous diet.                           -  Continue present medications.                           - Await pathology results.                           - Repeat colonoscopy date to be determined after                            pending pathology results are reviewed for                            surveillance.                           - Emerging evidence supports eating a diet of                            fruits, vegetables, grains, calcium, and yogurt                            while reducing red meat and alcohol may reduce the                            risk of colon cancer.                           - Thank you for allowing me to be involved in your                            colon cancer prevention. Thornton Park MD, MD 09/06/2019 11:10:16 AM This report has been signed electronically.

## 2019-09-06 NOTE — Patient Instructions (Signed)
Information on polyps and hemorrhoids given to you today.  Await pathology results.  Continue previous diet and medications.  YOU HAD AN ENDOSCOPIC PROCEDURE TODAY AT [facility]:   Refer to the procedure report that was given to you for any specific questions about what was found during the examination.  If the procedure report does not answer your questions, please call your gastroenterologist to clarify.  If you requested that your care partner not be given the details of your procedure findings, then the procedure report has been included in a sealed envelope for you to review at your convenience later.  YOU SHOULD EXPECT: Some feelings of bloating in the abdomen. Passage of more gas than usual.  Walking can help get rid of the air that was put into your GI tract during the procedure and reduce the bloating. If you had a lower endoscopy (such as a colonoscopy or flexible sigmoidoscopy) you may notice spotting of blood in your stool or on the toilet paper. If you underwent a bowel prep for your procedure, you may not have a normal bowel movement for a few days.  Please Note:  You might notice some irritation and congestion in your nose or some drainage.  This is from the oxygen used during your procedure.  There is no need for concern and it should clear up in a day or so.  SYMPTOMS TO REPORT IMMEDIATELY:   Following lower endoscopy (colonoscopy or flexible sigmoidoscopy):  Excessive amounts of blood in the stool  Significant tenderness or worsening of abdominal pains  Swelling of the abdomen that is new, acute  Fever of 100F or higher   For urgent or emergent issues, a gastroenterologist can be reached at any hour by calling [phone]. Do not use MyChart messaging for urgent concerns.    DIET:  We do recommend a small meal at first, but then you may proceed to your regular diet.  Drink plenty of fluids but you should avoid alcoholic beverages for 24  hours.  ACTIVITY:  You should plan to take it easy for the rest of today and you should NOT DRIVE or use heavy machinery until tomorrow (because of the sedation medicines used during the test).    FOLLOW UP: Our staff will call the number listed on your records 48-72 hours following your procedure to check on you and address any questions or concerns that you may have regarding the information given to you following your procedure. If we do not reach you, we will leave a message.  We will attempt to reach you two times.  During this call, we will ask if you have developed any symptoms of COVID 19. If you develop any symptoms (ie: fever, flu-like symptoms, shortness of breath, cough etc.) before then, please call [phone].  If you test positive for Covid 19 in the 2 weeks post procedure, please call and report this information to [place].    If any biopsies were taken you will be contacted by phone or by letter within the next 1-3 weeks.  Please call us at [phone] if you have not heard about the biopsies in 3 weeks.    SIGNATURES/CONFIDENTIALITY: You and/or your care partner have signed paperwork which will be entered into your electronic medical record.  These signatures attest to the fact that that the information above on your After Visit Summary has been reviewed and is understood.  Full responsibility of the confidentiality of this discharge information lies with you and/or  your care-partner. 

## 2019-09-10 ENCOUNTER — Encounter: Payer: Self-pay | Admitting: Gastroenterology

## 2019-09-10 ENCOUNTER — Telehealth: Payer: Self-pay

## 2019-09-10 ENCOUNTER — Encounter: Payer: Self-pay | Admitting: Family Medicine

## 2019-09-10 DIAGNOSIS — E039 Hypothyroidism, unspecified: Secondary | ICD-10-CM

## 2019-09-10 NOTE — Telephone Encounter (Signed)
  Follow up Call-  Call back number 09/06/2019  Post procedure Call Back phone  # [phone]  Permission to leave phone message Yes  Some recent data might be hidden     Patient questions:  Do you have a fever, pain , or abdominal swelling? No. Pain Score  0 *  Have you tolerated food without any problems? Yes.    Have you been able to return to your normal activities? Yes.    Do you have any questions about your discharge instructions: Diet   No. Medications  No. Follow up visit  No.  Do you have questions or concerns about your Care? No.  Actions: * If pain score is 4 or above: No action needed, pain <4.  1. Have you developed a fever since your procedure? no  2.   Have you had an respiratory symptoms (SOB or cough) since your procedure? no  3.   Have you tested positive for COVID 19 since your procedure no  4.   Have you had any family members/close contacts diagnosed with the COVID 19 since your procedure?  no   If yes to any of these questions please route to Joylene John, RN and Erenest Rasher, RN

## 2019-09-13 LAB — HM MAMMOGRAPHY

## 2019-09-14 ENCOUNTER — Encounter: Payer: Self-pay | Admitting: Neurology

## 2019-09-14 LAB — TSH: TSH: 2.12 mIU/L

## 2019-12-03 ENCOUNTER — Other Ambulatory Visit: Payer: Self-pay | Admitting: Family Medicine

## 2019-12-03 DIAGNOSIS — E039 Hypothyroidism, unspecified: Secondary | ICD-10-CM

## 2019-12-12 ENCOUNTER — Other Ambulatory Visit: Payer: Self-pay | Admitting: Family Medicine

## 2019-12-12 DIAGNOSIS — E039 Hypothyroidism, unspecified: Secondary | ICD-10-CM

## 2019-12-13 ENCOUNTER — Other Ambulatory Visit: Payer: Self-pay | Admitting: Family Medicine

## 2019-12-13 DIAGNOSIS — E039 Hypothyroidism, unspecified: Secondary | ICD-10-CM

## 2019-12-20 ENCOUNTER — Telehealth: Payer: Self-pay | Admitting: Family Medicine

## 2019-12-20 DIAGNOSIS — R748 Abnormal levels of other serum enzymes: Secondary | ICD-10-CM

## 2019-12-20 DIAGNOSIS — E039 Hypothyroidism, unspecified: Secondary | ICD-10-CM

## 2019-12-20 DIAGNOSIS — I10 Essential (primary) hypertension: Secondary | ICD-10-CM

## 2019-12-20 NOTE — Telephone Encounter (Signed)
Pt advised that she is due for fasting labs. She stated that she is fine with getting those done also.

## 2019-12-20 NOTE — Telephone Encounter (Signed)
Pt called. She wants lab Order for Thyroid levels. She has scheduled an appointment for November 12th.  Thank you.

## 2019-12-21 NOTE — Telephone Encounter (Signed)
Agree with documentation as above.   Rawley Harju, MD  

## 2019-12-28 ENCOUNTER — Ambulatory Visit (INDEPENDENT_AMBULATORY_CARE_PROVIDER_SITE_OTHER): Payer: Managed Care, Other (non HMO) | Admitting: Family Medicine

## 2019-12-28 ENCOUNTER — Other Ambulatory Visit: Payer: Self-pay

## 2019-12-28 ENCOUNTER — Encounter: Payer: Self-pay | Admitting: Family Medicine

## 2019-12-28 VITALS — BP 130/69 | HR 72 | Ht 66.0 in | Wt 197.0 lb

## 2019-12-28 DIAGNOSIS — L719 Rosacea, unspecified: Secondary | ICD-10-CM

## 2019-12-28 DIAGNOSIS — R21 Rash and other nonspecific skin eruption: Secondary | ICD-10-CM

## 2019-12-28 DIAGNOSIS — I10 Essential (primary) hypertension: Secondary | ICD-10-CM | POA: Diagnosis not present

## 2019-12-28 DIAGNOSIS — R748 Abnormal levels of other serum enzymes: Secondary | ICD-10-CM

## 2019-12-28 DIAGNOSIS — E039 Hypothyroidism, unspecified: Secondary | ICD-10-CM | POA: Diagnosis not present

## 2019-12-28 DIAGNOSIS — K760 Fatty (change of) liver, not elsewhere classified: Secondary | ICD-10-CM | POA: Diagnosis not present

## 2019-12-28 LAB — COMPLETE METABOLIC PANEL WITH GFR
AG Ratio: 1.9 (calc) (ref 1.0–2.5)
ALT: 61 U/L — ABNORMAL HIGH (ref 6–29)
AST: 37 U/L — ABNORMAL HIGH (ref 10–35)
Albumin: 4.3 g/dL (ref 3.6–5.1)
Alkaline phosphatase (APISO): 58 U/L (ref 37–153)
BUN: 21 mg/dL (ref 7–25)
CO2: 31 mmol/L (ref 20–32)
Calcium: 9.8 mg/dL (ref 8.6–10.4)
Chloride: 100 mmol/L (ref 98–110)
Creat: 0.91 mg/dL (ref 0.50–1.05)
GFR, Est African American: 84 mL/min/{1.73_m2} (ref >=60)
GFR, Est Non African American: 73 mL/min/{1.73_m2} (ref >=60)
Globulin: 2.3 g/dL (calc) (ref 1.9–3.7)
Glucose, Bld: 93 mg/dL (ref 65–99)
Potassium: 3.8 mmol/L (ref 3.5–5.3)
Sodium: 140 mmol/L (ref 135–146)
Total Bilirubin: 0.8 mg/dL (ref 0.2–1.2)
Total Protein: 6.6 g/dL (ref 6.1–8.1)

## 2019-12-28 LAB — CBC
HCT: 45.4 % — ABNORMAL HIGH (ref 35.0–45.0)
Hemoglobin: 15.1 g/dL (ref 11.7–15.5)
MCH: 30.5 pg (ref 27.0–33.0)
MCHC: 33.3 g/dL (ref 32.0–36.0)
MCV: 91.7 fL (ref 80.0–100.0)
MPV: 11.1 fL (ref 7.5–12.5)
Platelets: 318 10*3/uL (ref 140–400)
RBC: 4.95 10*6/uL (ref 3.80–5.10)
RDW: 12.8 % (ref 11.0–15.0)
WBC: 6 10*3/uL (ref 3.8–10.8)

## 2019-12-28 LAB — LIPID PANEL W/REFLEX DIRECT LDL
Cholesterol: 220 mg/dL — ABNORMAL HIGH (ref <200)
HDL: 53 mg/dL (ref >=50)
LDL Cholesterol (Calc): 140 mg/dL (calc) — ABNORMAL HIGH
Non-HDL Cholesterol (Calc): 167 mg/dL (calc) — ABNORMAL HIGH (ref <130)
Total CHOL/HDL Ratio: 4.2 (calc) (ref <5.0)
Triglycerides: 144 mg/dL (ref <150)

## 2019-12-28 LAB — TSH: TSH: 1.16 mIU/L

## 2019-12-28 MED ORDER — HYDROCHLOROTHIAZIDE 25 MG PO TABS: 25.0000 mg | ORAL_TABLET | Freq: Every day | ORAL | 1 refills | Status: DC

## 2019-12-28 MED ORDER — TRIAMCINOLONE ACETONIDE 0.5 % EX OINT: 1.0000 "application " | TOPICAL_OINTMENT | Freq: Two times a day (BID) | CUTANEOUS | 0 refills | Status: DC

## 2019-12-28 MED ORDER — TOBRADEX 0.3-0.1 % OP OINT
TOPICAL_OINTMENT | Freq: Two times a day (BID) | OPHTHALMIC | 4 refills | Status: DC | PRN
Start: 2019-12-28 — End: 2021-06-08

## 2019-12-28 MED ORDER — SYNTHROID 137 MCG PO TABS: 137.0000 ug | ORAL_TABLET | Freq: Every day | ORAL | 1 refills | Status: DC

## 2019-12-28 NOTE — Progress Notes (Signed)
Established Patient Office Visit  Subjective:  Patient ID: Kim Livingston, female    DOB: 1965-10-10  Age: 54 y.o. MRN: [id number]  CC:  Chief Complaint  Patient presents with  . Eczema  . Joint Pain  . Lab results    HPI Kim Livingston presents for   Hypertension- Pt denies chest pain, SOB, dizziness, or heart palpitations.  Taking meds as directed w/o problems.  Denies medication side effects.    Hypothyroidism - Taking medication regularly in the AM away from food and vitamins, etc. No recent change to skin, hair, or energy levels.  Also wants to discuss her dry skin today.  It started out around her eyes and she does use a TobraDex ophthalmic ointment that her eye doctor gave her which does seem to really help and soothe.  But she feels like she is getting patches that are spreading onto her temples and scalp and even onto the back of her neck she says it definitely seems to have progressed over the last 6 months.  Also complains of joint pain she says 1 day gets her shoulder but then another day it can be another joint.  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  She says she always has bilateral hip pain but that is not really new but she just feels like she has been experiencing more joint pain in general over the last couple of months.  She says it does seem to come and go.  He also wants to go over recent labs.  Follow up on recent lab work showing significant elevation in her liver enzymes.  She has had an ultrasound about a year ago for mild elevation but they did go up recently.  But she admits she has been eating out more and has gained about 5 pounds.  She is still fairly physically active at her job.  Ports her [facility].  Past Medical History:  Diagnosis Date  . Abscess of peritoneum ([place]) 2016  . Edema   . Fatty liver   . GERD (gastroesophageal reflux disease)    hx  . Heart murmur   . Hypertension     borderline  . Hypothyroidism   . PONV (postoperative nausea and vomiting)    used scop patch-did well  . Wears glasses     Past Surgical History:  Procedure Laterality Date  . ABDOMINAL HYSTERECTOMY     Cervix is present.   . ADENOIDECTOMY    . APPENDECTOMY    . BREAST LUMPECTOMY WITH NEEDLE LOCALIZATION Right 05/05/2012   Procedure: BREAST LUMPECTOMY WITH NEEDLE LOCALIZATION;  Surgeon: Adin Hector, MD;  Location: [facility];  Service: General;  Laterality: Right;  . cervical back fusion     Upper back.   . CESAREAN SECTION    . CHOLECYSTECTOMY    . TONSILLECTOMY    . WISDOM TOOTH EXTRACTION      Family History  Problem Relation Age of Onset  . Lung cancer Mother   . Lupus Mother   . Cancer Mother        lung  . Heart disease Father        4 vessel bypass, smoker  . Heart failure Father   . Lung cancer Maternal Grandmother   . Stroke Maternal Grandmother   . Bone cancer Paternal Grandfather   . Cancer Maternal Aunt   . Cancer Paternal Uncle  leukemia  . Cancer Paternal Uncle        lung  . Esophageal cancer Paternal Uncle   . Colon cancer Neg Hx   . Rectal cancer Neg Hx   . Stomach cancer Neg Hx   . Colon polyps Neg Hx     Social History   Socioeconomic History  . Marital status: Married    Spouse name: Mortimer Fries  . Number of children: 1  . Years of education: Not on file  . Highest education level: Not on file  Occupational History  . Occupation: Programme researcher, broadcasting/film/video: [employer]   Tobacco Use  . Smoking status: Former Smoker    Quit date: 08/15/2001    Years since quitting: 18.3  . Smokeless tobacco: Never Used  Vaping Use  . Vaping Use: Never used  Substance and Sexual Activity  . Alcohol use: No  . Drug use: No  . Sexual activity: Yes    Partners: Male    Birth control/protection: Surgical    Comment: 1st intercourse- 19, partners- 2, married- 46 yrs   Other Topics Concern  . Not on file  Social History  Narrative   Some exercise 3-5 days per week, walking. .  2 cup caffeine daily.    Social Determinants of Health   Financial Resource Strain:   . Difficulty of Paying Living Expenses: Not on file  Food Insecurity:   . Worried About Charity fundraiser in the Last Year: Not on file  . Ran Out of Food in the Last Year: Not on file  Transportation Needs:   . Lack of Transportation (Medical): Not on file  . Lack of Transportation (Non-Medical): Not on file  Physical Activity:   . Days of Exercise per Week: Not on file  . Minutes of Exercise per Session: Not on file  Stress:   . Feeling of Stress : Not on file  Social Connections:   . Frequency of Communication with Friends and Family: Not on file  . Frequency of Social Gatherings with Friends and Family: Not on file  . Attends Religious Services: Not on file  . Active Member of Clubs or Organizations: Not on file  . Attends Archivist Meetings: Not on file  . Marital Status: Not on file  Intimate Partner Violence:   . Fear of Current or Ex-Partner: Not on file  . Emotionally Abused: Not on file  . Physically Abused: Not on file  . Sexually Abused: Not on file    Outpatient Medications Prior to Visit  Medication Sig Dispense Refill  . Omega-3 Fatty Acids 1200 MG CAPS Take 2,400 mg by mouth 2 (two) times daily.    . Pediatric Multiple Vit-C-FA (PEDIATRIC MULTIVITAMIN) chewable tablet Chew 1 tablet by mouth daily.    . hydrochlorothiazide (HYDRODIURIL) 25 MG tablet TAKE ONE TABLET BY MOUTH DAILY 30 tablet 0  . SYNTHROID 137 MCG tablet TAKE ONE TABLET BY MOUTH DAILY WITH BREAKFAST 30 tablet 0  . TOBRADEX ophthalmic ointment     . Sodium Sulfate-Mag Sulfate-KCl (SUTAB) [phone] MG TABS Take 1 kit by mouth as directed. (Patient not taking: Reported on 12/28/2019) 24 tablet 0   No facility-administered medications prior to visit.    Allergies  Allergen Reactions  . Penicillins     REACTION: hives    ROS Review of  Systems    Objective:    Physical Exam Constitutional:      Appearance: She is well-developed.  HENT:  Head: Normocephalic and atraumatic.  Cardiovascular:     Rate and Rhythm: Normal rate and regular rhythm.     Heart sounds: Normal heart sounds.  Pulmonary:     Effort: Pulmonary effort is normal.     Breath sounds: Normal breath sounds.  Skin:    General: Skin is warm and dry.     Comments: Some erythema over her upper eyelids on her temple areas and a little bit on her forehead she has a patch on the right posterior neck as well that is a little bit more raised and the area on her right forehead is a little bit more fine scale to it.  Parts of it looks a little dry and then parts of it looks almost shiny.  Neurological:     Mental Status: She is alert and oriented to person, place, and time.  Psychiatric:        Behavior: Behavior normal.     BP 130/69 (BP Location: Left Arm, Patient Position: Sitting, Cuff Size: Normal)   Pulse 72   Ht 5' 6" (1.676 m)   Wt 197 lb (89.4 kg)   LMP 03/07/2011   SpO2 99%   BMI 31.80 kg/m  Wt Readings from Last 3 Encounters:  12/28/19 197 lb (89.4 kg)  09/06/19 198 lb (89.8 kg)  08/23/19 198 lb (89.8 kg)     Health Maintenance Due  Topic Date Due  . HIV Screening  Never done    There are no preventive care reminders to display for this patient.  Lab Results  Component Value Date   TSH 1.16 12/27/2019   Lab Results  Component Value Date   WBC 6.0 12/27/2019   HGB 15.1 12/27/2019   HCT 45.4 (H) 12/27/2019   MCV 91.7 12/27/2019   PLT 318 12/27/2019   Lab Results  Component Value Date   NA 140 12/27/2019   K 3.8 12/27/2019   CO2 31 12/27/2019   GLUCOSE 93 12/27/2019   BUN 21 12/27/2019   CREATININE 0.91 12/27/2019   BILITOT 0.8 12/27/2019   ALKPHOS 62 03/25/2016   AST 37 (H) 12/27/2019   ALT 61 (H) 12/27/2019   PROT 6.6 12/27/2019   ALBUMIN 4.5 03/25/2016   CALCIUM 9.8 12/27/2019   Lab Results  Component  Value Date   CHOL 220 (H) 12/27/2019   Lab Results  Component Value Date   HDL 53 12/27/2019   Lab Results  Component Value Date   LDLCALC 140 (H) 12/27/2019   Lab Results  Component Value Date   TRIG 144 12/27/2019   Lab Results  Component Value Date   CHOLHDL 4.2 12/27/2019   Lab Results  Component Value Date   HGBA1C 5.5 06/13/2017      Assessment & Plan:   Problem List Items Addressed This Visit      Cardiovascular and Mediastinum   Essential hypertension - Primary    Well controlled. Continue current regimen. Follow up in  6 mo.       Relevant Medications   hydrochlorothiazide (HYDRODIURIL) 25 MG tablet     Digestive   Fatty liver    To recheck liver enzymes in about 2 to 3 months.  We will work on cutting back on eating out, really cleaning up the diet and trying to lose about 5 pounds.        Endocrine   Hypothyroid    Level looks great on her labs that resulted this morning.  Continue current regimen.  Follow-up in  6 months.      Relevant Medications   SYNTHROID 137 MCG tablet     Musculoskeletal and Integument   Rosacea    He actually reports that her rosacea has been significantly improved since cutting out caffeine.        Other   Elevated liver enzymes    Recommend return in about 2 to 3 months to recheck liver enzymes.       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  Like to refer her to Dr. Saundra Shelling at [facility] for further evaluation will likely need punch biopsy for further work-up.  Meds ordered this encounter  Medications  . SYNTHROID 137 MCG tablet    Sig: Take 1 tablet (137 mcg total) by mouth daily with breakfast.    Dispense:  90 tablet     Refill:  1  . hydrochlorothiazide (HYDRODIURIL) 25 MG tablet    Sig: Take 1 tablet (25 mg total) by mouth daily.    Dispense:  90 tablet    Refill:  1  . TOBRADEX ophthalmic ointment    Sig: Place into both eyes 2 (two) times daily as needed.    Dispense:  7 g    Refill:  4  . triamcinolone ointment (KENALOG) 0.5 %    Sig: Apply 1 application topically 2 (two) times daily. Don't use for more than 14 consecutive days. Avoid the eyes.    Dispense:  30 g    Refill:  0    Follow-up: Return in about 2 months (around 02/27/2020) for recheck liver .    Beatrice Lecher, MD

## 2019-12-28 NOTE — Assessment & Plan Note (Signed)
Recommend return in about 2 to 3 months to recheck liver enzymes.

## 2019-12-28 NOTE — Assessment & Plan Note (Signed)
He actually reports that her rosacea has been significantly improved since cutting out caffeine.

## 2019-12-28 NOTE — Assessment & Plan Note (Signed)
Well controlled. Continue current regimen. Follow up in  6 mo  

## 2019-12-28 NOTE — Assessment & Plan Note (Signed)
Level looks great on her labs that resulted this morning.  Continue current regimen.  Follow-up in 6 months.

## 2019-12-28 NOTE — Assessment & Plan Note (Signed)
To recheck liver enzymes in about 2 to 3 months.  We will work on cutting back on eating out, really cleaning up the diet and trying to lose about 5 pounds.

## 2020-02-27 ENCOUNTER — Other Ambulatory Visit: Payer: Self-pay

## 2020-02-27 DIAGNOSIS — R748 Abnormal levels of other serum enzymes: Secondary | ICD-10-CM

## 2020-02-27 NOTE — Progress Notes (Signed)
Labs ordered.

## 2020-02-28 ENCOUNTER — Ambulatory Visit: Payer: Managed Care, Other (non HMO) | Admitting: Family Medicine

## 2020-03-08 LAB — COMPLETE METABOLIC PANEL WITH GFR
AG Ratio: 1.9 (calc) (ref 1.0–2.5)
ALT: 45 U/L — ABNORMAL HIGH (ref 6–29)
AST: 26 U/L (ref 10–35)
Albumin: 4.4 g/dL (ref 3.6–5.1)
Alkaline phosphatase (APISO): 61 U/L (ref 37–153)
BUN: 14 mg/dL (ref 7–25)
CO2: 29 mmol/L (ref 20–32)
Calcium: 9.6 mg/dL (ref 8.6–10.4)
Chloride: 106 mmol/L (ref 98–110)
Creat: 0.81 mg/dL (ref 0.50–1.05)
GFR, Est African American: 96 mL/min/{1.73_m2} (ref >=60)
GFR, Est Non African American: 83 mL/min/{1.73_m2} (ref >=60)
Globulin: 2.3 g/dL (calc) (ref 1.9–3.7)
Glucose, Bld: 99 mg/dL (ref 65–99)
Potassium: 4.3 mmol/L (ref 3.5–5.3)
Sodium: 142 mmol/L (ref 135–146)
Total Bilirubin: 0.4 mg/dL (ref 0.2–1.2)
Total Protein: 6.7 g/dL (ref 6.1–8.1)

## 2020-03-11 ENCOUNTER — Ambulatory Visit (INDEPENDENT_AMBULATORY_CARE_PROVIDER_SITE_OTHER): Payer: Managed Care, Other (non HMO) | Admitting: Family Medicine

## 2020-03-11 ENCOUNTER — Encounter: Payer: Self-pay | Admitting: Family Medicine

## 2020-03-11 ENCOUNTER — Other Ambulatory Visit: Payer: Self-pay

## 2020-03-11 DIAGNOSIS — M2550 Pain in unspecified joint: Secondary | ICD-10-CM | POA: Diagnosis not present

## 2020-03-11 DIAGNOSIS — R748 Abnormal levels of other serum enzymes: Secondary | ICD-10-CM | POA: Diagnosis not present

## 2020-03-11 NOTE — Patient Instructions (Signed)
Send my a Mychart in April to get your liver rechecked.

## 2020-03-11 NOTE — Assessment & Plan Note (Signed)
Kim Livingston may be adding turmeric into her regimen okay to use Advil she is really only using it maybe once a week and that is perfectly reasonable and should not affect her liver we did discuss that chronic long-term use can affect renal function.  But it sounds like she is not overusing the medication at this point.

## 2020-03-11 NOTE — Progress Notes (Signed)
Established Patient Office Visit  Subjective:  Patient ID: Kim Livingston, female    DOB: 07/11/1965  Age: 55 y.o. MRN: [id number]  CC:  Chief Complaint  Patient presents with  . Follow-up    HPI Kim Livingston presents for follow-up of elevated liver enzymes.  We had rechecked her liver enzymes about 2 months ago and they jumped up significantly.  Her AST was 37 and ALT was 61.  We had previously done an ultrasound in July 2020 showing liver findings consistent with hepatic steatosis.  Though her med of your fibrosis score was F0/F1.  Her fibrosis was minimal.  She has been exercising regularly with walking and more recently has added and going up and down the stairs in the mornings in the evenings at work.  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  He has been using Advil for joint pain as she had a couple falls on the ice recently and wants to know if that is okay and say for her liver she has been avoiding Tylenol.  She is planning on maybe taking some fish oil as well.  And wants to know what else I would recommend  Past Medical History:  Diagnosis Date  . Abscess of peritoneum ([place]) 2016  . Edema   . Fatty liver   . GERD (gastroesophageal reflux disease)    hx  . Heart murmur   . Hypertension    borderline  . Hypothyroidism   . PONV (postoperative nausea and vomiting)    used scop patch-did well  . Wears glasses     Past Surgical History:  Procedure Laterality Date  . ABDOMINAL HYSTERECTOMY     Cervix is present.   . ADENOIDECTOMY    . APPENDECTOMY    . BREAST LUMPECTOMY WITH NEEDLE LOCALIZATION Right 05/05/2012   Procedure: BREAST LUMPECTOMY WITH NEEDLE LOCALIZATION;  Surgeon: Adin Hector, MD;  Location: [facility];  Service: General;  Laterality: Right;  . cervical back fusion     Upper back.   . CESAREAN SECTION    . CHOLECYSTECTOMY    . TONSILLECTOMY    . WISDOM TOOTH EXTRACTION      Family  History  Problem Relation Age of Onset  . Lung cancer Mother   . Lupus Mother   . Cancer Mother        lung  . Heart disease Father        4 vessel bypass, smoker  . Heart failure Father   . Lung cancer Maternal Grandmother   . Stroke Maternal Grandmother   . Bone cancer Paternal Grandfather   . Cancer Maternal Aunt   . Cancer Paternal Uncle        leukemia  . Cancer Paternal Uncle        lung  . Esophageal cancer Paternal Uncle   . Colon cancer Neg Hx   . Rectal cancer Neg Hx   . Stomach cancer Neg Hx   . Colon polyps Neg Hx     Social History   Socioeconomic History  . Marital status: Married    Spouse name: Mortimer Fries  . Number of children: 1  . Years of education: Not on file  . Highest education level: Not on file  Occupational History  . Occupation: Programme researcher, broadcasting/film/video: [employer]   Tobacco Use  . Smoking status: Former Smoker    Quit date:  08/15/2001    Years since quitting: 18.5  . Smokeless tobacco: Never Used  Vaping Use  . Vaping Use: Never used  Substance and Sexual Activity  . Alcohol use: No  . Drug use: No  . Sexual activity: Yes    Partners: Male    Birth control/protection: Surgical    Comment: 1st intercourse- 19, partners- 2, married- 86 yrs   Other Topics Concern  . Not on file  Social History Narrative   Some exercise 3-5 days per week, walking. .  2 cup caffeine daily.    Social Determinants of Health   Financial Resource Strain: Not on file  Food Insecurity: Not on file  Transportation Needs: Not on file  Physical Activity: Not on file  Stress: Not on file  Social Connections: Not on file  Intimate Partner Violence: Not on file    Outpatient Medications Prior to Visit  Medication Sig Dispense Refill  . hydrochlorothiazide (HYDRODIURIL) 25 MG tablet Take 1 tablet (25 mg total) by mouth daily. 90 tablet 1  . Omega-3 Fatty Acids 1200 MG CAPS Take 2,400 mg by mouth 2 (two) times daily.    . Pediatric Multiple Vit-C-FA  (PEDIATRIC MULTIVITAMIN) chewable tablet Chew 1 tablet by mouth daily.    Marland Kitchen SYNTHROID 137 MCG tablet Take 1 tablet (137 mcg total) by mouth daily with breakfast. 90 tablet 1  . TOBRADEX ophthalmic ointment Place into both eyes 2 (two) times daily as needed. 7 g 4  . triamcinolone ointment (KENALOG) 0.5 % Apply 1 application topically 2 (two) times daily. Don't use for more than 14 consecutive days. Avoid the eyes. 30 g 0   No facility-administered medications prior to visit.    Allergies  Allergen Reactions  . Penicillins     REACTION: hives    ROS Review of Systems    Objective:    Physical Exam Vitals reviewed.  Constitutional:      Appearance: She is well-developed and well-nourished.  HENT:     Head: Normocephalic and atraumatic.  Eyes:     Extraocular Movements: EOM normal.     Conjunctiva/sclera: Conjunctivae normal.  Cardiovascular:     Rate and Rhythm: Normal rate.  Pulmonary:     Effort: Pulmonary effort is normal.  Skin:    General: Skin is dry.     Coloration: Skin is not pale.  Neurological:     Mental Status: She is alert and oriented to person, place, and time.  Psychiatric:        Mood and Affect: Mood and affect normal.        Behavior: Behavior normal.     BP (!) 116/59   Pulse 91   Ht 5\' 6"  (1.676 m)   Wt 197 lb (89.4 kg)   LMP 03/07/2011   SpO2 99%   BMI 31.80 kg/m  Wt Readings from Last 3 Encounters:  03/11/20 197 lb (89.4 kg)  12/28/19 197 lb (89.4 kg)  09/06/19 198 lb (89.8 kg)     Health Maintenance Due  Topic Date Due  . HIV Screening  Never done    There are no preventive care reminders to display for this patient.  Lab Results  Component Value Date   TSH 1.16 12/27/2019   Lab Results  Component Value Date   WBC 6.0 12/27/2019   HGB 15.1 12/27/2019   HCT 45.4 (H) 12/27/2019   MCV 91.7 12/27/2019   PLT 318 12/27/2019   Lab Results  Component Value Date   NA  142 03/07/2020   K 4.3 03/07/2020   CO2 29 03/07/2020    GLUCOSE 99 03/07/2020   BUN 14 03/07/2020   CREATININE 0.81 03/07/2020   BILITOT 0.4 03/07/2020   ALKPHOS 62 03/25/2016   AST 26 03/07/2020   ALT 45 (H) 03/07/2020   PROT 6.7 03/07/2020   ALBUMIN 4.5 03/25/2016   CALCIUM 9.6 03/07/2020   Lab Results  Component Value Date   CHOL 220 (H) 12/27/2019   Lab Results  Component Value Date   HDL 53 12/27/2019   Lab Results  Component Value Date   LDLCALC 140 (H) 12/27/2019   Lab Results  Component Value Date   TRIG 144 12/27/2019   Lab Results  Component Value Date   CHOLHDL 4.2 12/27/2019   Lab Results  Component Value Date   HGBA1C 5.5 06/13/2017      Assessment & Plan:   Problem List Items Addressed This Visit      Other   Polyarthralgia    Gust may be adding turmeric into her regimen okay to use Advil she is really only using it maybe once a week and that is perfectly reasonable and should not affect her liver we did discuss that chronic long-term use can affect renal function.  But it sounds like she is not overusing the medication at this point.      Elevated liver enzymes    Repeat liver enzymes have come down in fact her AST is normal and ALT is just slightly elevated.  Plan to recheck liver enzymes again in about 3 months.  Encouraged her to continue to work on healthy diet with good fats.  Decrease carbs, sweets and processed foods.  Continue with regular exercise which she is already doing a great job with.  I really like to see her lose about 10 pounds and get her BMI under 30 think that would help [place] reach her goals.         No orders of the defined types were placed in this encounter.   Follow-up: No follow-ups on file.    Beatrice Lecher, MD

## 2020-03-11 NOTE — Assessment & Plan Note (Signed)
Repeat liver enzymes have come down in fact her AST is normal and ALT is just slightly elevated.  Plan to recheck liver enzymes again in about 3 months.  Encouraged her to continue to work on healthy diet with good fats.  Decrease carbs, sweets and processed foods.  Continue with regular exercise which she is already doing a great job with.  I really like to see her lose about 10 pounds and get her BMI under 30 think that would help [place] reach her goals.

## 2020-06-16 ENCOUNTER — Other Ambulatory Visit: Payer: Self-pay | Admitting: Family Medicine

## 2020-06-16 DIAGNOSIS — I10 Essential (primary) hypertension: Secondary | ICD-10-CM

## 2020-06-17 ENCOUNTER — Other Ambulatory Visit: Payer: Self-pay | Admitting: Family Medicine

## 2020-06-17 ENCOUNTER — Telehealth: Payer: Self-pay | Admitting: *Deleted

## 2020-06-17 DIAGNOSIS — E039 Hypothyroidism, unspecified: Secondary | ICD-10-CM

## 2020-06-17 NOTE — Telephone Encounter (Signed)
Pt stated that she will need a PA for Branded Synthriod. She said that she is unable to take the generic.

## 2020-09-23 LAB — HM MAMMOGRAPHY

## 2020-09-26 ENCOUNTER — Other Ambulatory Visit: Payer: Self-pay | Admitting: *Deleted

## 2020-09-26 ENCOUNTER — Encounter: Payer: Self-pay | Admitting: Family Medicine

## 2020-09-26 DIAGNOSIS — E039 Hypothyroidism, unspecified: Secondary | ICD-10-CM

## 2020-09-26 MED ORDER — SYNTHROID 137 MCG PO TABS: 137.0000 ug | ORAL_TABLET | Freq: Every day | ORAL | 1 refills | Status: DC

## 2020-12-03 ENCOUNTER — Other Ambulatory Visit: Payer: Self-pay | Admitting: Family Medicine

## 2020-12-03 DIAGNOSIS — E039 Hypothyroidism, unspecified: Secondary | ICD-10-CM

## 2020-12-26 ENCOUNTER — Telehealth: Payer: Self-pay | Admitting: *Deleted

## 2020-12-26 ENCOUNTER — Other Ambulatory Visit: Payer: Self-pay | Admitting: *Deleted

## 2020-12-26 DIAGNOSIS — E039 Hypothyroidism, unspecified: Secondary | ICD-10-CM

## 2020-12-26 DIAGNOSIS — Z114 Encounter for screening for human immunodeficiency virus [HIV]: Secondary | ICD-10-CM

## 2020-12-26 DIAGNOSIS — I10 Essential (primary) hypertension: Secondary | ICD-10-CM

## 2020-12-26 DIAGNOSIS — R748 Abnormal levels of other serum enzymes: Secondary | ICD-10-CM

## 2020-12-26 NOTE — Telephone Encounter (Signed)
LVM informing pt that her labs have been ordered.

## 2020-12-29 LAB — COMPLETE METABOLIC PANEL WITH GFR
AG Ratio: 2 (calc) (ref 1.0–2.5)
ALT: 44 U/L — ABNORMAL HIGH (ref 6–29)
AST: 29 U/L (ref 10–35)
Albumin: 4.6 g/dL (ref 3.6–5.1)
Alkaline phosphatase (APISO): 68 U/L (ref 37–153)
BUN/Creatinine Ratio: 19 (calc) (ref 6–22)
BUN: 20 mg/dL (ref 7–25)
CO2: 30 mmol/L (ref 20–32)
Calcium: 10 mg/dL (ref 8.6–10.4)
Chloride: 104 mmol/L (ref 98–110)
Creat: 1.06 mg/dL — ABNORMAL HIGH (ref 0.50–1.03)
Globulin: 2.3 g/dL (calc) (ref 1.9–3.7)
Glucose, Bld: 90 mg/dL (ref 65–99)
Potassium: 3.7 mmol/L (ref 3.5–5.3)
Sodium: 144 mmol/L (ref 135–146)
Total Bilirubin: 0.4 mg/dL (ref 0.2–1.2)
Total Protein: 6.9 g/dL (ref 6.1–8.1)
eGFR: 63 mL/min/{1.73_m2} (ref >=60)

## 2020-12-29 LAB — HIV ANTIBODY (ROUTINE TESTING W REFLEX): HIV 1&2 Ab, 4th Generation: NONREACTIVE

## 2020-12-29 LAB — TSH: TSH: 3.12 mIU/L

## 2020-12-29 NOTE — Progress Notes (Signed)
Hi Kim Livingston, Your kidney function increased slightly from baseline.   But you also look a little bit more dry on your blood work so just make sure you are drinking plenty of fluid and we will still plan to recheck your function again in 6 months.  Your ALT liver enzyme is still in the normal range and your ALT is still slightly elevated similar to what it was last time.  Just continue to work on healthy diet and regular exercise.  Your thyroid level looks good. Cholesterol still pending.

## 2020-12-30 ENCOUNTER — Encounter: Payer: Self-pay | Admitting: Family Medicine

## 2020-12-30 NOTE — Progress Notes (Signed)
Negative for HIV

## 2021-01-05 ENCOUNTER — Other Ambulatory Visit: Payer: Self-pay | Admitting: Family Medicine

## 2021-01-05 DIAGNOSIS — I10 Essential (primary) hypertension: Secondary | ICD-10-CM

## 2021-01-31 ENCOUNTER — Other Ambulatory Visit: Payer: Self-pay | Admitting: Family Medicine

## 2021-01-31 DIAGNOSIS — E039 Hypothyroidism, unspecified: Secondary | ICD-10-CM

## 2021-03-20 ENCOUNTER — Other Ambulatory Visit: Payer: Self-pay

## 2021-03-20 DIAGNOSIS — I10 Essential (primary) hypertension: Secondary | ICD-10-CM

## 2021-03-20 MED ORDER — HYDROCHLOROTHIAZIDE 25 MG PO TABS: 25.0000 mg | ORAL_TABLET | Freq: Every day | ORAL | 0 refills | Status: DC

## 2021-03-28 IMAGING — US US ABDOMEN COMPLETE WITH ELASTOGRAPHY
2 series · 13 of 25 positions shown · non-contrast
Comparison: None.

CLINICAL DATA: Abnormal liver enzymes.



[Series 1: us abdomen complete with elastography · 10 of 56 slices shown (1 of 2)]
[im 1/56]
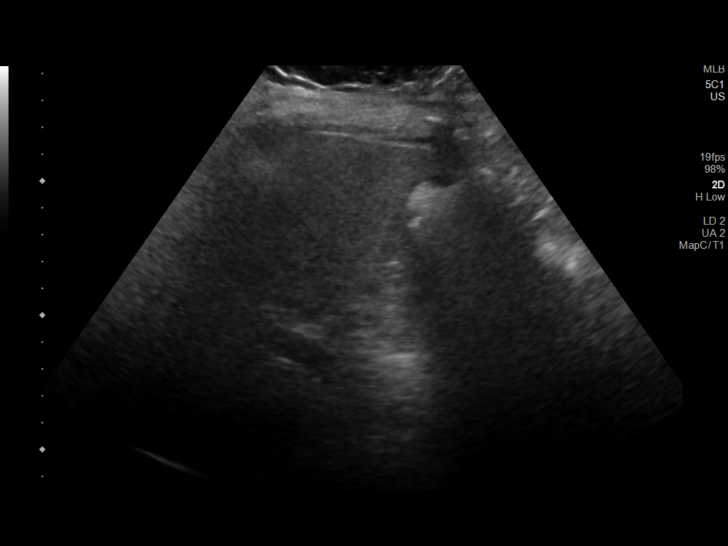
[im 6/56]
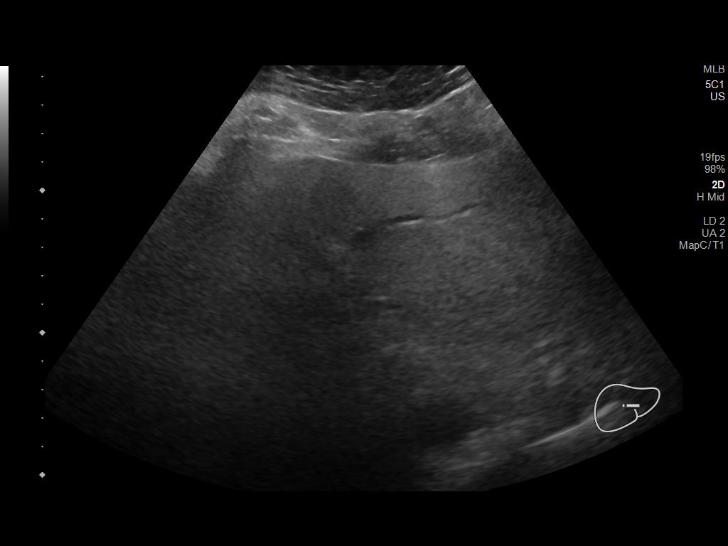
[im 12/56]
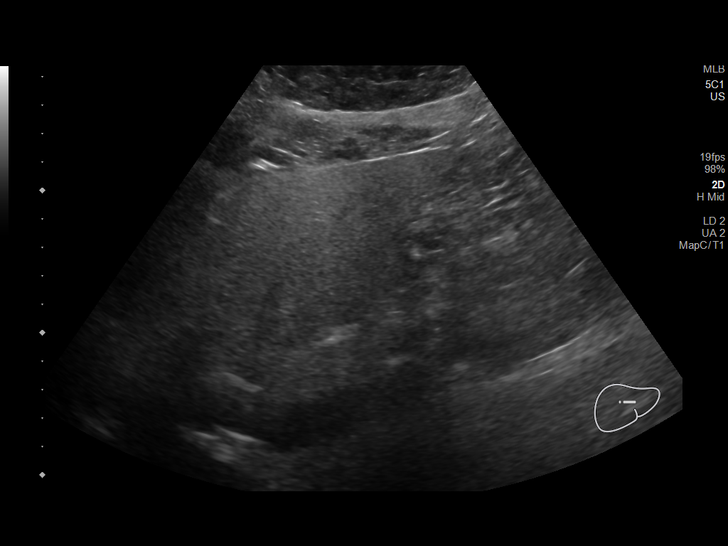
[im 18/56]
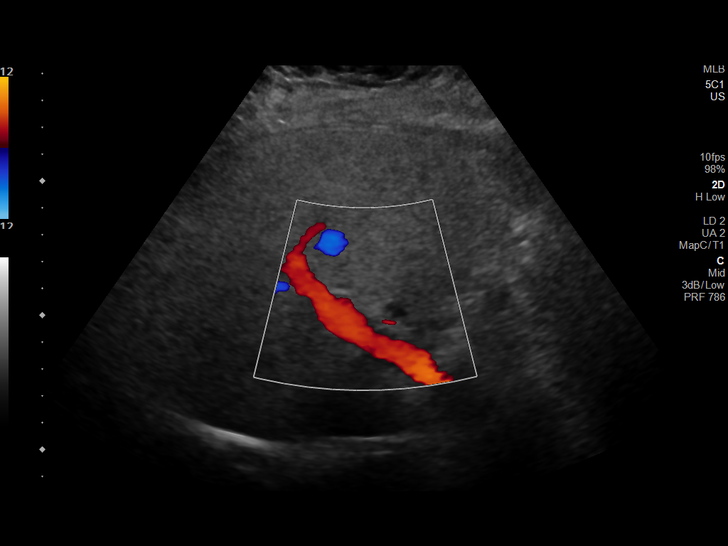
[im 24/56]
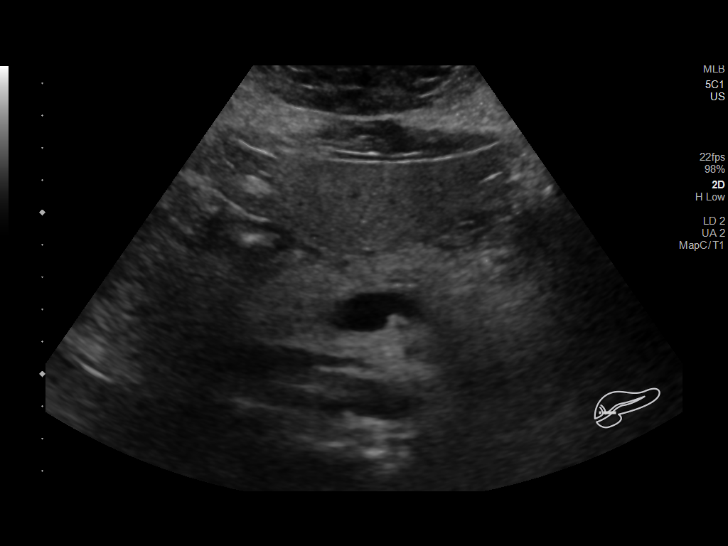
[im 29/56]
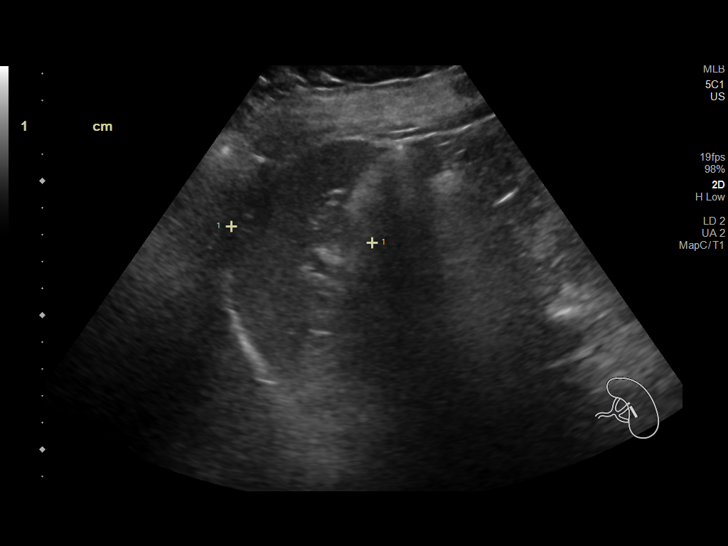
[im 35/56]
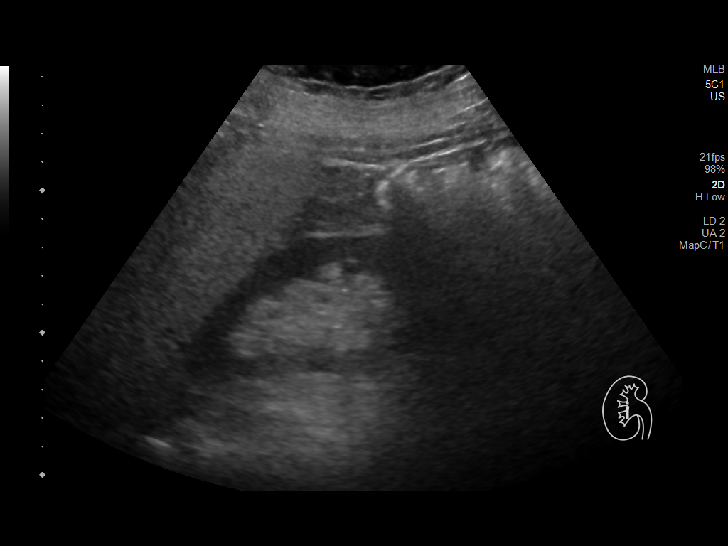
[im 41/56]
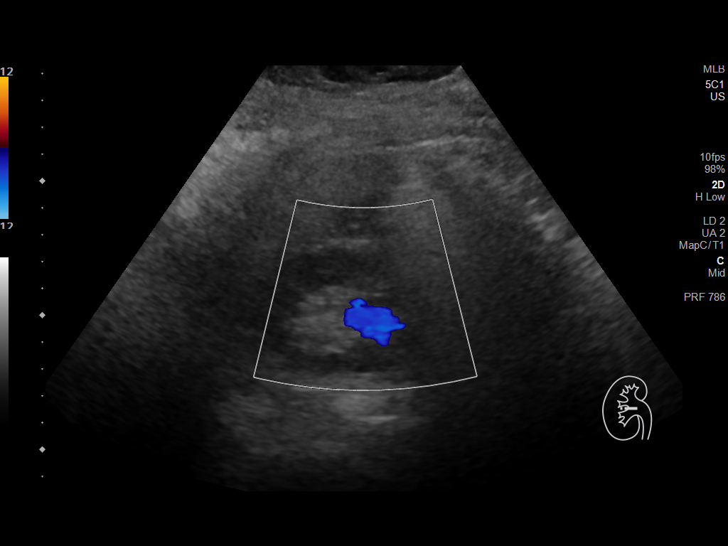
[im 47/56]
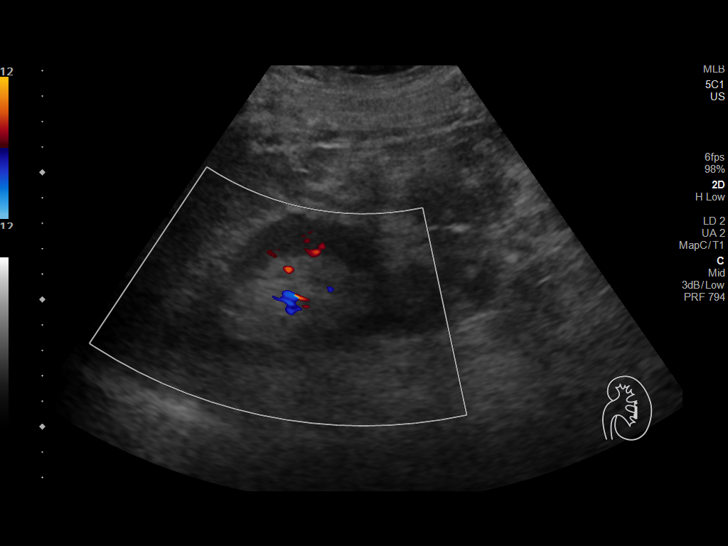
[im 53/56]
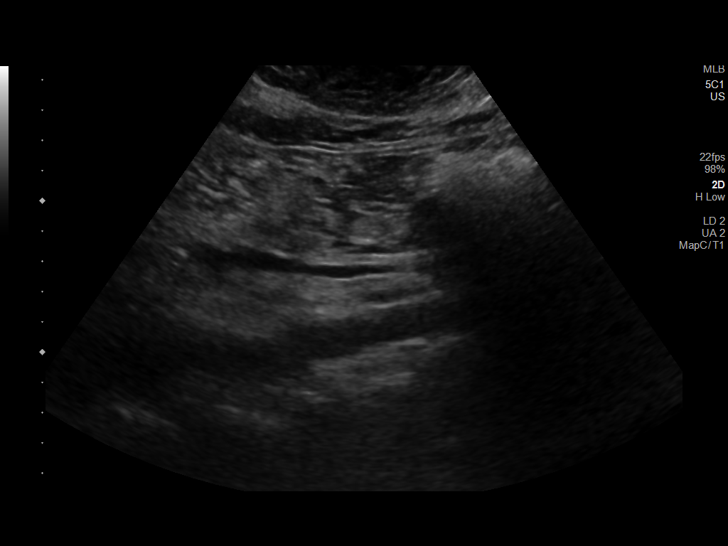

[Series 2: us abdomen complete with elastography · 3 of 13 slices shown (2 of 2)]
[im 1/13]
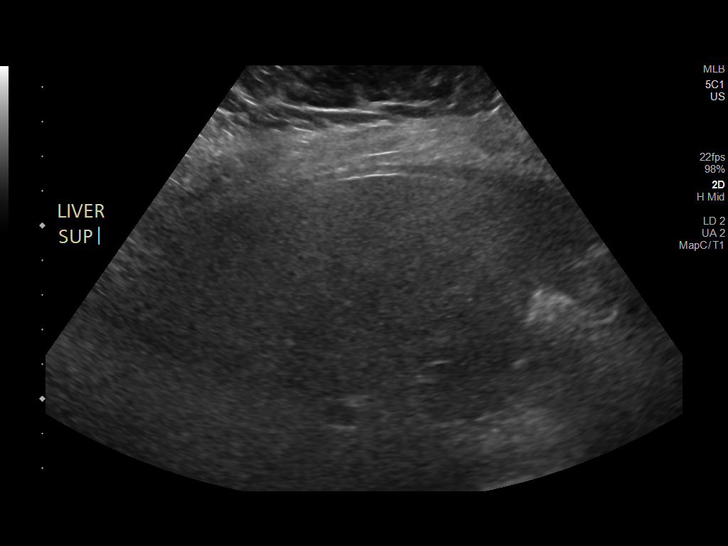
[im 7/13]
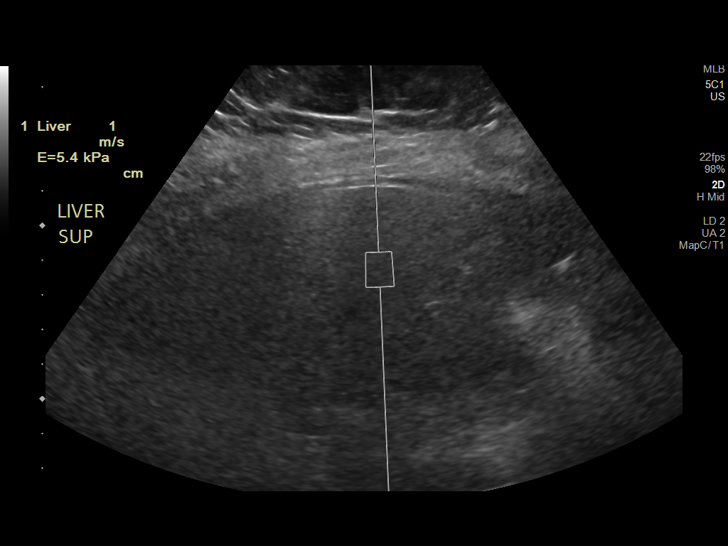
[im 13/13]
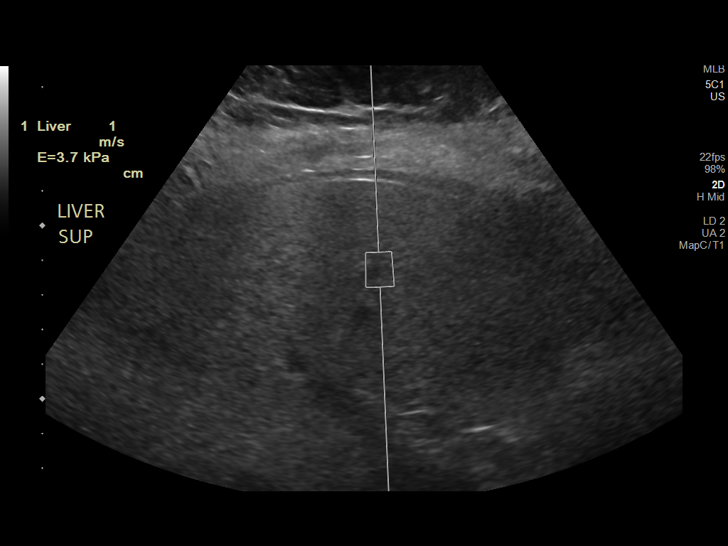

[13 of 25 positions shown; findings below may reference images not displayed]

FINDINGS: ULTRASOUND ABDOMEN

Gallbladder: Status post cholecystectomy

Common bile duct: Diameter: 6.5 mm

Liver: No focal lesion identified. Increased echogenicity of the
liver parenchyma. Portal vein is patent on color Doppler imaging
with normal direction of blood flow towards the liver.

IVC: No abnormality visualized.

Pancreas: Visualized portion unremarkable.

Spleen: Size and appearance within normal limits.

Right Kidney: Length: 12.0 cm. Echogenicity within normal limits. No
mass or hydronephrosis visualized.

Left Kidney: Length: 11.1 cm. Echogenicity within normal limits. No
mass or hydronephrosis visualized.

Abdominal aorta: No aneurysm visualized.

Other findings: None.

ULTRASOUND HEPATIC ELASTOGRAPHY

Device: Siemens Helix VTQ

Patient position: Supine

Transducer 5 C1

Number of measurements: 10

Hepatic segment:  8

Median velocity:   1.20 m/sec

IQR:

IQR/Median velocity ratio:

Corresponding Metavir fibrosis score:  F0/F1

Risk of fibrosis: Minimal

Limitations of exam: None

Please note that abnormal shear wave velocities may also be
identified in clinical settings other than with hepatic fibrosis,
such as: acute hepatitis, elevated right heart and central venous
pressures including use of beta blockers, Laure disease
(Paulus N), infiltrative processes such as
mastocytosis/amyloidosis/infiltrative tumor, extrahepatic
cholestasis, in the post-prandial state, and liver transplantation.
Correlation with patient history, laboratory data, and clinical
condition recommended.
IMPRESSION: ULTRASOUND ABDOMEN:

1. Increased parenchymal echogenicity suggesting hepatic steatosis.
2. Status post cholecystectomy.

ULTRASOUND HEPATIC ELASTOGRAPHY:

Median hepatic shear wave velocity is calculated at 1.2 m/sec.

Corresponding Metavir fibrosis score is F0/F1.

Risk of fibrosis is Minimal.

Follow-up: None required

## 2021-06-08 ENCOUNTER — Other Ambulatory Visit: Payer: Self-pay | Admitting: Family Medicine

## 2021-06-08 ENCOUNTER — Encounter: Payer: Self-pay | Admitting: Family Medicine

## 2021-06-08 DIAGNOSIS — E039 Hypothyroidism, unspecified: Secondary | ICD-10-CM

## 2021-06-08 DIAGNOSIS — I10 Essential (primary) hypertension: Secondary | ICD-10-CM

## 2021-06-08 MED ORDER — SYNTHROID 137 MCG PO TABS: 137.0000 ug | ORAL_TABLET | Freq: Every day | ORAL | 1 refills | Status: DC

## 2021-06-08 MED ORDER — HYDROCHLOROTHIAZIDE 25 MG PO TABS: 25.0000 mg | ORAL_TABLET | Freq: Every day | ORAL | 0 refills | Status: DC

## 2021-06-08 MED ORDER — TOBRADEX 0.3-0.1 % OP OINT: TOPICAL_OINTMENT | Freq: Two times a day (BID) | OPHTHALMIC | 4 refills | Status: DC | PRN

## 2021-06-15 ENCOUNTER — Other Ambulatory Visit: Payer: Self-pay

## 2021-06-15 ENCOUNTER — Other Ambulatory Visit: Payer: Self-pay | Admitting: Family Medicine

## 2021-06-15 ENCOUNTER — Encounter: Payer: Self-pay | Admitting: Family Medicine

## 2021-06-15 DIAGNOSIS — I10 Essential (primary) hypertension: Secondary | ICD-10-CM

## 2021-06-15 DIAGNOSIS — E7800 Pure hypercholesterolemia, unspecified: Secondary | ICD-10-CM

## 2021-06-15 DIAGNOSIS — R748 Abnormal levels of other serum enzymes: Secondary | ICD-10-CM

## 2021-06-15 NOTE — Progress Notes (Signed)
Ordered labs

## 2021-06-16 LAB — COMPLETE METABOLIC PANEL WITH GFR
AG Ratio: 1.7 (calc) (ref 1.0–2.5)
ALT: 35 U/L — ABNORMAL HIGH (ref 6–29)
AST: 24 U/L (ref 10–35)
Albumin: 4.5 g/dL (ref 3.6–5.1)
Alkaline phosphatase (APISO): 71 U/L (ref 37–153)
BUN: 17 mg/dL (ref 7–25)
CO2: 31 mmol/L (ref 20–32)
Calcium: 9.9 mg/dL (ref 8.6–10.4)
Chloride: 100 mmol/L (ref 98–110)
Creat: 0.84 mg/dL (ref 0.50–1.03)
Globulin: 2.6 g/dL (calc) (ref 1.9–3.7)
Glucose, Bld: 97 mg/dL (ref 65–99)
Potassium: 3.7 mmol/L (ref 3.5–5.3)
Sodium: 141 mmol/L (ref 135–146)
Total Bilirubin: 0.8 mg/dL (ref 0.2–1.2)
Total Protein: 7.1 g/dL (ref 6.1–8.1)
eGFR: 83 mL/min/{1.73_m2} (ref >=60)

## 2021-06-16 LAB — LIPID PANEL W/REFLEX DIRECT LDL
Cholesterol: 195 mg/dL (ref <200)
HDL: 53 mg/dL (ref >=50)
LDL Cholesterol (Calc): 116 mg/dL (calc) — ABNORMAL HIGH
Non-HDL Cholesterol (Calc): 142 mg/dL (calc) — ABNORMAL HIGH (ref <130)
Total CHOL/HDL Ratio: 3.7 (calc) (ref <5.0)
Triglycerides: 147 mg/dL (ref <150)

## 2021-06-16 NOTE — Progress Notes (Signed)
Hi Kim Livingston, kidney function looks great.  The ALT liver enzymes still a little elevated but is trending down so last time it was 44 it is now 35 and 29 is normal.  Your LDL cholesterol looks much better it is back down to 116 which is great.  Continue to work on [employer] and regular exercise.

## 2021-06-23 ENCOUNTER — Encounter: Payer: Self-pay | Admitting: Family Medicine

## 2021-06-23 ENCOUNTER — Telehealth: Payer: Self-pay | Admitting: Family Medicine

## 2021-06-23 MED ORDER — TOBRAMYCIN-DEXAMETHASONE 0.3-0.1 % OP SUSP
2.0000 [drp] | Freq: Four times a day (QID) | OPHTHALMIC | 0 refills | Status: DC
Start: 2021-06-23 — End: 2021-06-23

## 2021-06-23 MED ORDER — TOBRAMYCIN-DEXAMETHASONE 0.3-0.1 % OP SUSP: 2.0000 [drp] | Freq: Four times a day (QID) | OPHTHALMIC | 0 refills | Status: DC

## 2021-06-23 NOTE — Telephone Encounter (Signed)
Meds ordered this encounter  ?Medications  ? DISCONTD: tobramycin-dexamethasone (TOBRADEX) ophthalmic solution  ?  Sig: Place 2 drops into both eyes every 6 (six) hours.  ?  Dispense:  5 mL  ?  Refill:  0  ? tobramycin-dexamethasone (TOBRADEX) ophthalmic solution  ?  Sig: Place 2 drops into both eyes every 6 (six) hours.  ?  Dispense:  5 mL  ?  Refill:  0  ? ? ?

## 2021-07-17 ENCOUNTER — Encounter: Payer: Self-pay | Admitting: Family Medicine

## 2021-07-17 DIAGNOSIS — E039 Hypothyroidism, unspecified: Secondary | ICD-10-CM

## 2021-07-17 MED ORDER — SYNTHROID 137 MCG PO TABS: 137.0000 ug | ORAL_TABLET | Freq: Every day | ORAL | 0 refills | Status: DC

## 2021-07-30 ENCOUNTER — Encounter: Payer: Self-pay | Admitting: Family Medicine

## 2021-07-30 ENCOUNTER — Ambulatory Visit (INDEPENDENT_AMBULATORY_CARE_PROVIDER_SITE_OTHER): Payer: Managed Care, Other (non HMO) | Admitting: Family Medicine

## 2021-07-30 VITALS — BP 108/60 | HR 82 | Ht 66.0 in | Wt 193.0 lb

## 2021-07-30 DIAGNOSIS — I10 Essential (primary) hypertension: Secondary | ICD-10-CM

## 2021-07-30 DIAGNOSIS — R7301 Impaired fasting glucose: Secondary | ICD-10-CM | POA: Diagnosis not present

## 2021-07-30 DIAGNOSIS — Z0000 Encounter for general adult medical examination without abnormal findings: Secondary | ICD-10-CM

## 2021-07-30 DIAGNOSIS — E039 Hypothyroidism, unspecified: Secondary | ICD-10-CM

## 2021-07-30 MED ORDER — HYDROCHLOROTHIAZIDE 25 MG PO TABS: 25.0000 mg | ORAL_TABLET | Freq: Every day | ORAL | 1 refills | Status: DC

## 2021-07-30 MED ORDER — SYNTHROID 137 MCG PO TABS: 137.0000 ug | ORAL_TABLET | Freq: Every day | ORAL | 1 refills | Status: DC

## 2021-07-30 MED ORDER — NEOMYCIN-POLYMYXIN-DEXAMETH 3.5-10000-0.1 OP OINT: TOPICAL_OINTMENT | OPHTHALMIC | 1 refills | Status: DC

## 2021-07-30 NOTE — Progress Notes (Signed)
Pt declined zostavax today.

## 2021-07-30 NOTE — Progress Notes (Signed)
Complete physical exam  Patient: Kim Livingston   DOB: Jan 26, 1966   56 y.o. Female  MRN: [id number]  Subjective:    Chief Complaint  Patient presents with   Annual Exam    Kim Livingston is a 56 y.o. female who presents today for a complete physical exam. She reports consuming a general diet. The patient does not participate in regular exercise at present. She generally feels well. She reports sleeping well. She does not have additional problems to discuss today.   Brough in labs done through work.  A1C was 5.8.    Most recent fall risk assessment:    07/30/2021    9:10 AM  Fall Risk   Falls in the past year? 0  Number falls in past yr: 0  Injury with Fall? 0  Risk for fall due to : No Fall Risks  Follow up Falls prevention discussed     Most recent depression screenings:    07/30/2021    9:10 AM 12/28/2019    2:19 PM  PHQ 2/9 Scores  PHQ - 2 Score 0 0        Patient Care Team: Hali Marry, MD as PCP - General Princess Bruins, MD as Consulting Physician (Obstetrics and Gynecology)   Outpatient Medications Prior to Visit  Medication Sig   Omega-3 Fatty Acids 1200 MG CAPS Take 2,400 mg by mouth 2 (two) times daily.   Pediatric Multiple Vit-C-FA (PEDIATRIC MULTIVITAMIN) chewable tablet Chew 1 tablet by mouth daily.   tobramycin-dexamethasone (TOBRADEX) ophthalmic solution Place 2 drops into both eyes every 6 (six) hours.   [DISCONTINUED] hydrochlorothiazide (HYDRODIURIL) 25 MG tablet Take 1 tablet (25 mg total) by mouth daily. Need appointment with PCP for future refills.   [DISCONTINUED] SYNTHROID 137 MCG tablet Take 1 tablet (137 mcg total) by mouth daily with breakfast.   [DISCONTINUED] triamcinolone ointment (KENALOG) 0.5 % Apply 1 application topically 2 (two) times daily. Don't use for more than 14 consecutive days. Avoid the eyes.   No facility-administered medications prior to visit.    ROS        Objective:     BP 108/60   Pulse 82    Ht '5\' 6"'$  (1.676 m)   Wt 193 lb (87.5 kg)   LMP 03/07/2011   SpO2 97%   BMI 31.15 kg/m    Physical Exam Vitals and nursing note reviewed.  Constitutional:      Appearance: She is well-developed.  HENT:     Head: Normocephalic and atraumatic.     Right Ear: Tympanic membrane, ear canal and external ear normal.     Left Ear: Tympanic membrane, ear canal and external ear normal.     Nose: Nose normal.  Eyes:     Conjunctiva/sclera: Conjunctivae normal.     Pupils: Pupils are equal, round, and reactive to light.  Neck:     Thyroid: No thyromegaly.  Cardiovascular:     Rate and Rhythm: Normal rate and regular rhythm.     Heart sounds: Normal heart sounds.  Pulmonary:     Effort: Pulmonary effort is normal.     Breath sounds: Normal breath sounds. No wheezing.  Abdominal:     General: Bowel sounds are normal.     Palpations: Abdomen is soft.  Musculoskeletal:     Cervical back: Neck supple.  Lymphadenopathy:     Cervical: No cervical adenopathy.  Skin:    General: Skin is warm and dry.     Coloration: Skin  is not pale.  Neurological:     Mental Status: She is alert and oriented to person, place, and time.  Psychiatric:        Behavior: Behavior normal.      No results found for any visits on 07/30/21.     Assessment & Plan:    Routine Health Maintenance and Physical Exam  Immunization History  Administered Date(s) Administered   Influenza Split 11/16/2011, 11/22/2016   Influenza Whole 11/25/2005   Influenza, Quadrivalent, Recombinant, Inj, Pf 11/05/2018   Influenza,inj,Quad PF,6+ Mos 10/20/2012, 10/23/2013, 10/24/2015, 10/20/2017   Influenza-Unspecified 11/01/2019, 11/08/2020   Moderna Covid-19 Vaccine Bivalent Booster 38yr & up 11/29/2020   Moderna Sars-Covid-2 Vaccination 02/20/2019, 03/22/2019, 01/02/2020   Td 02/15/1994, 09/10/2009   Tdap 08/20/2015    Health Maintenance  Topic Date Due   Zoster Vaccines- Shingrix (1 of 2) 10/31/2022 (Originally  02/19/1986)   INFLUENZA VACCINE  09/15/2021   MAMMOGRAM  09/23/2021   PAP SMEAR-Modifier  12/06/2021   TETANUS/TDAP  08/19/2025   COLONOSCOPY (Pts 45-421yrInsurance coverage will need to be confirmed)  09/06/2026   COVID-19 Vaccine  Completed   Hepatitis C Screening  Completed   HIV Screening  Completed   Pneumococcal Vaccine [id number] old  Aged Out   HPV VACCINES  Aged Out    Discussed health benefits of physical activity, and encouraged her to engage in regular exercise appropriate for her age and condition.  Problem List Items Addressed This Visit       Cardiovascular and Mediastinum   Essential hypertension   Relevant Medications   hydrochlorothiazide (HYDRODIURIL) 25 MG tablet     Endocrine   IFG (impaired fasting glucose)    New diagnosis based on recent A1c of 5.8.  Discussed the importance of cutting back on sweets and carbs.  She admits that sugar sometimes is her weakness.  Also encouraged her to start working on increasing her activity level.  Because of recent increased stressors at work etc. she has not been exercising as consistently and just encouraged her to get back on track.  Plan to follow-up in 6 months.      Hypothyroid    Lab Results  Component Value Date   TSH 3.12 12/26/2020        Relevant Medications   SYNTHROID 137 MCG tablet   Other Visit Diagnoses     Wellness examination    -  Primary       Keep up a regular exercise program and make sure you are eating a healthy diet Try to eat 4 servings of dairy a day, or if you are lactose intolerant take a calcium with vitamin D daily.  Your vaccines are up to date.   Return in about 6 months (around 01/29/2022) for Pre-diabetes.     CaBeatrice LecherMD

## 2021-07-30 NOTE — Assessment & Plan Note (Signed)
Lab Results  Component Value Date   TSH 3.12 12/26/2020

## 2021-07-30 NOTE — Assessment & Plan Note (Signed)
New diagnosis based on recent A1c of 5.8.  Discussed the importance of cutting back on sweets and carbs.  She admits that sugar sometimes is her weakness.  Also encouraged her to start working on increasing her activity level.  Because of recent increased stressors at work etc. she has not been exercising as consistently and just encouraged her to get back on track.  Plan to follow-up in 6 months.

## 2021-08-03 ENCOUNTER — Other Ambulatory Visit: Payer: Self-pay | Admitting: *Deleted

## 2021-08-23 ENCOUNTER — Encounter: Payer: Self-pay | Admitting: Emergency Medicine

## 2021-08-23 ENCOUNTER — Emergency Department
Admission: EM | Admit: 2021-08-23 | Discharge: 2021-08-23 | Disposition: A | Discharge status: Home / Self Care | Payer: Managed Care, Other (non HMO) | Source: Home / Self Care | Attending: Family Medicine | Admitting: Family Medicine

## 2021-08-23 DIAGNOSIS — L03114 Cellulitis of left upper limb: Secondary | ICD-10-CM

## 2021-08-23 DIAGNOSIS — T63461A Toxic effect of venom of wasps, accidental (unintentional), initial encounter: Secondary | ICD-10-CM

## 2021-08-23 MED ORDER — METHYLPREDNISOLONE SODIUM SUCC 125 MG IJ SOLR
80.0000 mg | Freq: Once | INTRAMUSCULAR | Status: AC
  Administered 2021-08-23: 80 mg via INTRAMUSCULAR

## 2021-08-23 MED ORDER — DOXYCYCLINE HYCLATE 100 MG PO CAPS: ORAL_CAPSULE | ORAL | 0 refills | Status: DC

## 2021-08-23 MED ORDER — PREDNISONE 20 MG PO TABS: ORAL_TABLET | ORAL | 0 refills | Status: DC

## 2021-08-23 NOTE — Discharge Instructions (Addendum)
Begin prednisone Monday 08/24/21. Continue Benadryl at bedtime.  May take Zyrtec (non-sedating) daytime.  If symptoms become significantly worse during the night or over the weekend, proceed to the local emergency room.

## 2021-08-23 NOTE — ED Triage Notes (Signed)
Patient c/o wasp sting yesterday on her left arm and hand.  The area is red, swollen and has some blisters that are draining.  Patient has taken Benadryl and Advil. No SOB.

## 2021-08-23 NOTE — ED Provider Notes (Signed)
[facility]    CSN: [id number] Arrival date & time: 08/23/21  1036      History   Chief Complaint Chief Complaint  Patient presents with   Insect Bite    HPI Kim Livingston is a 56 y.o. female.   Patient was stung by a wasp on her left forearm yesterday.  The area has become increasing red and swollen.  Today she noticed a crop of clear vesicles, some weeping, around the sting site.  She denies fevers, chills, and sweats and feels well otherwise.  The history is provided by the patient.    Past Medical History:  Diagnosis Date   Abscess of peritoneum ([place]) 2016   Edema    Fatty liver    GERD (gastroesophageal reflux disease)    hx   Heart murmur    Hypertension    borderline   Hypothyroidism    PONV (postoperative nausea and vomiting)    used scop patch-did well   Wears glasses     Patient Active Problem List   Diagnosis Date Noted   IFG (impaired fasting glucose) 07/30/2021   Polyarthralgia 03/11/2020   Pain in both feet 11/16/2018   Fatty liver 08/28/2018   Essential hypertension 03/25/2016   Elevated liver enzymes 08/01/2015   Intraductal papilloma of breast 05/29/2012   Rosacea 10/19/2011   RHINITIS 11/08/2010   Hypothyroid 11/29/2005    Past Surgical History:  Procedure Laterality Date   ABDOMINAL HYSTERECTOMY     Cervix is present.    ADENOIDECTOMY     APPENDECTOMY     BREAST LUMPECTOMY WITH NEEDLE LOCALIZATION Right 05/05/2012   Procedure: BREAST LUMPECTOMY WITH NEEDLE LOCALIZATION;  Surgeon: Adin Hector, MD;  Location: [facility];  Service: General;  Laterality: Right;   cervical back fusion     Upper back.    CESAREAN SECTION     CHOLECYSTECTOMY     TONSILLECTOMY     WISDOM TOOTH EXTRACTION      OB History     Gravida  1   Para  1   Term      Preterm      AB      Living  1      SAB      IAB      Ectopic      Multiple      Live Births               Home Medications    Prior to  Admission medications   Medication Sig Start Date End Date Taking? Authorizing Provider  doxycycline (VIBRAMYCIN) 100 MG capsule Take one cap PO Q12hr with food. 08/23/21  Yes Kandra Nicolas, MD  hydrochlorothiazide (HYDRODIURIL) 25 MG tablet Take 1 tablet (25 mg total) by mouth daily. 07/30/21  Yes Hali Marry, MD  neomycin-polymyxin b-dexamethasone (MAXITROL) 9.3-79024-0.9 OINT 1 application to both upper outer eyelids daily 07/30/21  Yes Hali Marry, MD  Omega-3 Fatty Acids 1200 MG CAPS Take 2,400 mg by mouth 2 (two) times daily.   Yes [provider]  Pediatric Multiple Vit-C-FA (PEDIATRIC MULTIVITAMIN) chewable tablet Chew 1 tablet by mouth daily.   Yes [provider]  predniSONE (DELTASONE) 20 MG tablet Take one tab by mouth twice daily for 4 days, then one daily for 3 days. Take with food. 08/23/21  Yes Kandra Nicolas, MD  SYNTHROID 137 MCG tablet Take 1 tablet (137 mcg total) by mouth daily with breakfast. 07/30/21  Yes Hali Marry, MD  tobramycin-dexamethasone [facility]) ophthalmic solution Place 2 drops into both eyes every 6 (six) hours. 06/23/21  Yes Hali Marry, MD    Family History Family History  Problem Relation Age of Onset   Lung cancer Mother    Lupus Mother    Cancer Mother        lung   Heart disease Father        4 vessel bypass, smoker   Heart failure Father    Lung cancer Maternal Grandmother    Stroke Maternal Grandmother    Bone cancer Paternal Grandfather    Cancer Maternal Aunt    Cancer Paternal Uncle        leukemia   Cancer Paternal Uncle        lung   Esophageal cancer Paternal Uncle    Colon cancer Neg Hx    Rectal cancer Neg Hx    Stomach cancer Neg Hx    Colon polyps Neg Hx     Social History Social History   Tobacco Use   Smoking status: Former    Types: Cigarettes    Quit date: 08/15/2001    Years since quitting: 20.0   Smokeless tobacco: Never  Vaping Use   Vaping Use: Never used   Substance Use Topics   Alcohol use: No   Drug use: No     Allergies   Penicillins   Review of Systems Review of Systems  Constitutional:  Negative for activity change, appetite change, chills, diaphoresis, fatigue and fever.  Respiratory:  Negative for cough, chest tightness, shortness of breath, wheezing and stridor.   Cardiovascular:  Negative for chest pain and palpitations.  Musculoskeletal:  Negative for myalgias.  Skin:  Positive for color change.  All other systems reviewed and are negative.    Physical Exam Triage Vital Signs ED Triage Vitals  Enc Vitals Group     BP 08/23/21 1055 110/71     Pulse Rate 08/23/21 1055 (!) 101     Resp 08/23/21 1055 18     Temp 08/23/21 1055 99.7 F (37.6 C)     Temp Source 08/23/21 1055 Oral     SpO2 08/23/21 1055 96 %     Weight 08/23/21 1057 191 lb (86.6 kg)     Height 08/23/21 1057 '5\' 4"'$  (1.626 m)     Head Circumference --      Peak Flow --      Pain Score 08/23/21 1057 0     Pain Loc --      Pain Edu? --      Excl. in [place]? --    No data found.  Updated Vital Signs BP 110/71 (BP Location: Right Arm)   Pulse (!) 101   Temp 99.7 F (37.6 C) (Oral)   Resp 18   Ht '5\' 4"'$  (1.626 m)   Wt 86.6 kg   LMP 03/07/2011   SpO2 96%   BMI 32.79 kg/m   Visual Acuity Right Eye Distance:   Left Eye Distance:   Bilateral Distance:    Right Eye Near:   Left Eye Near:    Bilateral Near:     Physical Exam Vitals and nursing note reviewed.  Constitutional:      General: She is not in acute distress. HENT:     Head: Normocephalic.     Mouth/Throat:     Mouth: Mucous membranes are moist.  Eyes:     Conjunctiva/sclera: Conjunctivae normal.  Pupils: Pupils are equal, round, and reactive to light.  Cardiovascular:     Rate and Rhythm: Tachycardia present.  Pulmonary:     Effort: Pulmonary effort is normal.  Musculoskeletal:        General: Swelling present.     Left forearm: Swelling and tenderness present.        Arms:     Comments: Left forearm is mildly swollen, erythematous, and warm.  There is minimal tenderness to palpation.  There is a group of vesicles with clear fluid located on volar left forearm as noted on diagram.    Skin:    General: Skin is warm and dry.  Neurological:     Mental Status: She is alert and oriented to person, place, and time.      UC Treatments / Results  Labs (all labs ordered are listed, but only abnormal results are displayed) Labs Reviewed - No data to display  EKG   Radiology No results found.  Procedures Procedures (including critical care time)  Medications Ordered in UC Medications  methylPREDNISolone sodium succinate (SOLU-MEDROL) 125 mg/2 mL injection 80 mg (80 mg Intramuscular Given 08/23/21 1143)    Initial Impression / Assessment and Plan / UC Course  I have reviewed the triage vital signs and the nursing notes.  Pertinent labs & imaging results that were available during my care of the patient were reviewed by me and considered in my medical decision making (see chart for details).    Administered Solumedrol '80mg'$  IM.  Begin prednisone burst/taper tomorrow. Begin doxycycline '100mg'$  BID Followup with Family Doctor if not improved in about 4 days.   Final Clinical Impressions(s) / UC Diagnoses   Final diagnoses:  Wasp sting, accidental or unintentional, initial encounter  Cellulitis of left arm     Discharge Instructions      Begin prednisone Monday 08/24/21. Continue Benadryl at bedtime.  May take Zyrtec (non-sedating) daytime.  If symptoms become significantly worse during the night or over the weekend, proceed to the local emergency room.     ED Prescriptions     Medication Sig Dispense Auth. Provider   doxycycline (VIBRAMYCIN) 100 MG capsule Take one cap PO Q12hr with food. 14 capsule Kandra Nicolas, MD   predniSONE (DELTASONE) 20 MG tablet Take one tab by mouth twice daily for 4 days, then one daily for 3 days. Take  with food. 11 tablet Kandra Nicolas, MD         Kandra Nicolas, MD 08/24/21 1248

## 2021-08-24 ENCOUNTER — Ambulatory Visit: Payer: Managed Care, Other (non HMO) | Admitting: Family Medicine

## 2021-08-24 ENCOUNTER — Encounter: Payer: Self-pay | Admitting: Family Medicine

## 2021-08-24 VITALS — BP 143/51 | HR 79 | Ht 66.0 in | Wt 193.0 lb

## 2021-08-24 DIAGNOSIS — T63461A Toxic effect of venom of wasps, accidental (unintentional), initial encounter: Secondary | ICD-10-CM

## 2021-08-24 DIAGNOSIS — L03114 Cellulitis of left upper limb: Secondary | ICD-10-CM

## 2021-08-24 NOTE — Progress Notes (Signed)
   Acute Office Visit  Subjective:     Patient ID: Kim Livingston, female    DOB: 09-29-1965, 56 y.o.   MRN: [id number]  Chief Complaint  Patient presents with   Insect Bite    HPI Patient is in today for wasp sting on left forearm.  Initial bite on Sat with swelling and blisters that formed the next days. Went to UC and given a steroid shot and oral prednisone and doxycycline.  She started the oral prednisone she is already had 3 doses of the doxycycline.  She called this morning because when she woke up the redness which was just below her elbow had actually spread a couple inches above her elbow.  She already had redness and swelling all the way down her hand.  She has not noticed any new blisters and the ones that were there seem to be getting a little bit better  ROS      Objective:    BP (!) 143/51   Pulse 79   Ht '5\' 6"'$  (1.676 m)   Wt 193 lb (87.5 kg)   LMP 03/07/2011   SpO2 98%   BMI 31.15 kg/m    Physical Exam  No results found for any visits on 08/24/21.      Assessment & Plan:   Problem List Items Addressed This Visit   None Visit Diagnoses     Wasp sting, accidental or unintentional, initial encounter    -  Primary   Cellulitis of left arm         Cellulitis of left arm secondary to wasp thing-I think she is on a good regimen with the prednisone and the Doxy.  She came in today because she was concerned that the erythema was spreading upwards several inches above where it was yesterday.  She still has not experienced any shortness of breath or chest pressure or pain.  We discussed maybe adding an antihistamine to her regimen she did take a Benadryl last night but nothing today.  We will have her take Zyrtec today as soon as she is gets home and then take another Benadryl tonight.  We could add Pepcid as well which is an H2 blocker just to maximize potential benefit.  If she feels like it is continuing to get worse or develops new symptoms then please let us  know.  Continue taking the prednisone and the Doxy.  Recommend cool compresses and keeping the arm elevated intermittently throughout the day to help with swelling.   No orders of the defined types were placed in this encounter.   No follow-ups on file.  Beatrice Lecher, MD

## 2021-08-24 NOTE — Progress Notes (Signed)
Pt was seen by UC yesterday and started on prednisone and doxycycline

## 2021-09-29 LAB — HM MAMMOGRAPHY

## 2021-10-03 ENCOUNTER — Telehealth: Payer: Managed Care, Other (non HMO) | Admitting: Nurse Practitioner

## 2021-10-03 DIAGNOSIS — U071 COVID-19: Secondary | ICD-10-CM

## 2021-10-03 MED ORDER — PROMETHAZINE-DM 6.25-15 MG/5ML PO SYRP: 5.0000 mL | ORAL_SOLUTION | Freq: Four times a day (QID) | ORAL | 0 refills | Status: DC | PRN

## 2021-10-03 MED ORDER — MOLNUPIRAVIR EUA 200MG CAPSULE: 4.0000 | ORAL_CAPSULE | Freq: Two times a day (BID) | ORAL | 0 refills | Status: AC

## 2021-10-03 NOTE — Patient Instructions (Signed)

## 2021-10-03 NOTE — Progress Notes (Signed)
Virtual Visit Consent   Kim Livingston, you are scheduled for a virtual visit with Mary-Margaret Hassell Done, [place], a [facility] provider, today.     Just as with appointments in the office, your consent must be obtained to participate.  Your consent will be active for this visit and any virtual visit you may have with one of our providers in the next 365 days.     If you have a MyChart account, a copy of this consent can be sent to you electronically.  All virtual visits are billed to your insurance company just like a traditional visit in the office.    As this is a virtual visit, video technology does not allow for your provider to perform a traditional examination.  This may limit your provider's ability to fully assess your condition.  If your provider identifies any concerns that need to be evaluated in person or the need to arrange testing (such as labs, EKG, etc.), we will make arrangements to do so.     Although advances in technology are sophisticated, we cannot ensure that it will always work on either your end or our end.  If the connection with a video visit is poor, the visit may have to be switched to a telephone visit.  With either a video or telephone visit, we are not always able to ensure that we have a secure connection.     I need to obtain your verbal consent now.   Are you willing to proceed with your visit today? YES   Kim Livingston has provided verbal consent on 10/03/2021 for a virtual visit (video or telephone).   Mary-Margaret Hassell Done, FNP   Date: 10/03/2021 8:19 AM   Virtual Visit via Video Note   I, Mary-Margaret Hassell Done, connected with Kim Livingston ([id number], 1966-01-11) on 10/03/21 at  8:15 AM EDT by a video-enabled telemedicine application and verified that I am speaking with the correct person using two identifiers.  Location: Patient: Virtual Visit Location Patient: Home Provider: Virtual Visit Location Provider: Mobile   I discussed the limitations of  evaluation and management by telemedicine and the availability of in person appointments. The patient expressed understanding and agreed to proceed.    History of Present Illness: Kim Livingston is a 56 y.o. who identifies as a female who was assigned female at birth, and is being seen today for covid positive.  HPI: URI  The problem has been gradually worsening. Associated symptoms include congestion, coughing, headaches and a sore throat. Treatments tried: advil sinus.   Tested positive for covid last night. Review of Systems  HENT:  Positive for congestion and sore throat.   Respiratory:  Positive for cough.   Neurological:  Positive for headaches.    Problems:  Patient Active Problem List   Diagnosis Date Noted   IFG (impaired fasting glucose) 07/30/2021   Polyarthralgia 03/11/2020   Pain in both feet 11/16/2018   Fatty liver 08/28/2018   Essential hypertension 03/25/2016   Elevated liver enzymes 08/01/2015   Intraductal papilloma of breast 05/29/2012   Rosacea 10/19/2011   RHINITIS 11/08/2010   Hypothyroid 11/29/2005    Allergies:  Allergies  Allergen Reactions   Penicillins Hives    REACTION: hives REACTION: hives   Medications:  Current Outpatient Medications:    doxycycline (VIBRAMYCIN) 100 MG capsule, Take one cap PO Q12hr with food., Disp: 14 capsule, Rfl: 0   hydrochlorothiazide (HYDRODIURIL) 25 MG tablet, Take 1 tablet (25 mg total) by mouth  daily., Disp: 90 tablet, Rfl: 1   neomycin-polymyxin b-dexamethasone (MAXITROL) 3.5-32992-4.2 OINT, 1 application to both upper outer eyelids daily, Disp: 7 g, Rfl: 1   Omega-3 Fatty Acids 1200 MG CAPS, Take 2,400 mg by mouth 2 (two) times daily., Disp: , Rfl:    Pediatric Multiple Vit-C-FA (PEDIATRIC MULTIVITAMIN) chewable tablet, Chew 1 tablet by mouth daily., Disp: , Rfl:    predniSONE (DELTASONE) 20 MG tablet, Take one tab by mouth twice daily for 4 days, then one daily for 3 days. Take with food., Disp: 11 tablet, Rfl:  0   SYNTHROID 137 MCG tablet, Take 1 tablet (137 mcg total) by mouth daily with breakfast., Disp: 90 tablet, Rfl: 1   tobramycin-dexamethasone (TOBRADEX) ophthalmic solution, Place 2 drops into both eyes every 6 (six) hours., Disp: 5 mL, Rfl: 0  Observations/Objective: Patient is well-developed, well-nourished in no acute distress.  Resting comfortably  at home.  Head is normocephalic, atraumatic.  No labored breathing.  Speech is clear and coherent with logical content.  Patient is alert and oriented at baseline.  Raspy voice cough  Assessment and Plan:  Kim Livingston in today with chief complaint of Covid Positive   1. Positive self-administered antigen test for COVID-19 1. Take meds as prescribed 2. Use a cool mist humidifier especially during the winter months and when heat has been humid. 3. Use saline nose sprays frequently 4. Saline irrigations of the nose can be very helpful if done frequently.  * 4X daily for 1 week*  * Use of a nettie pot can be helpful with this. Follow directions with this* 5. Drink plenty of fluids 6. Keep thermostat turn down low 7.For any cough or congestion- promethazine DM 8. For fever or aces or pains- take tylenol or ibuprofen appropriate for age and weight.  * for fevers greater than 101 orally you may alternate ibuprofen and tylenol every  3 hours.    Meds ordered this encounter  Medications   molnupiravir EUA (LAGEVRIO) 200 mg CAPS capsule    Sig: Take 4 capsules (800 mg total) by mouth 2 (two) times daily for 5 days.    Dispense:  40 capsule    Refill:  0    Order Specific Question:   Supervising Provider    Answer:   Noemi Chapel [[id number]]   promethazine-dextromethorphan (PROMETHAZINE-DM) 6.25-15 MG/5ML syrup    Sig: Take 5 mLs by mouth 4 (four) times daily as needed for cough.    Dispense:  118 mL    Refill:  0    Order Specific Question:   Supervising Provider    Answer:   Noemi Chapel [[id number]]       Follow Up  Instructions: I discussed the assessment and treatment plan with the patient. The patient was provided an opportunity to ask questions and all were answered. The patient agreed with the plan and demonstrated an understanding of the instructions.  A copy of instructions were sent to the patient via MyChart.  The patient was advised to call back or seek an in-person evaluation if the symptoms worsen or if the condition fails to improve as anticipated.  Time:  I spent 8 minutes with the patient via telehealth technology discussing the above problems/concerns.    Mary-Margaret Hassell Done, FNP

## 2021-10-08 ENCOUNTER — Encounter: Payer: Self-pay | Admitting: Family Medicine

## 2021-10-09 NOTE — Telephone Encounter (Signed)
OK to provide note saying that she has active COVID infection and recommend that she avoid travel until she has complete resolution of symptoms and to please allow her to cancel her flight

## 2021-10-31 ENCOUNTER — Encounter: Payer: Self-pay | Admitting: Family Medicine

## 2022-01-27 ENCOUNTER — Encounter: Payer: Self-pay | Admitting: Family Medicine

## 2022-01-27 ENCOUNTER — Ambulatory Visit: Payer: Managed Care, Other (non HMO) | Admitting: Family Medicine

## 2022-01-27 VITALS — BP 127/59 | HR 77 | Ht 66.0 in | Wt 198.0 lb

## 2022-01-27 DIAGNOSIS — R7301 Impaired fasting glucose: Secondary | ICD-10-CM

## 2022-01-27 DIAGNOSIS — E039 Hypothyroidism, unspecified: Secondary | ICD-10-CM | POA: Diagnosis not present

## 2022-01-27 DIAGNOSIS — I10 Essential (primary) hypertension: Secondary | ICD-10-CM | POA: Diagnosis not present

## 2022-01-27 DIAGNOSIS — R1012 Left upper quadrant pain: Secondary | ICD-10-CM | POA: Diagnosis not present

## 2022-01-27 LAB — POCT GLYCOSYLATED HEMOGLOBIN (HGB A1C): Hemoglobin A1C: 5.8 % — AB (ref 4.0–5.6)

## 2022-01-27 MED ORDER — HYDROCHLOROTHIAZIDE 25 MG PO TABS: 25.0000 mg | ORAL_TABLET | Freq: Every day | ORAL | 1 refills | Status: DC

## 2022-01-27 MED ORDER — NEOMYCIN-POLYMYXIN-DEXAMETH 3.5-10000-0.1 OP OINT: TOPICAL_OINTMENT | OPHTHALMIC | 1 refills | Status: DC

## 2022-01-27 MED ORDER — SYNTHROID 137 MCG PO TABS: 137.0000 ug | ORAL_TABLET | Freq: Every day | ORAL | 1 refills | Status: DC

## 2022-01-27 NOTE — Progress Notes (Signed)
Established Patient Office Visit  Subjective   Patient ID: Kim Livingston, female    DOB: Jul 26, 1965  Age: 56 y.o. MRN: [id number]  Chief Complaint  Patient presents with   ifg   Hypothyroidism   Hypertension   Abdominal Pain    Not abdominal pain more so spasms/cramping x 6 months on L side    HPI  Impaired fasting glucose-no increased thirst or urination. No symptoms consistent with hypoglycemia.  Hypertension- Pt denies chest pain, SOB, dizziness, or heart palpitations.  Taking meds as directed w/o problems.  Denies medication side effects.    Occ left sided abdominal pain with BMS and bending over. If stretches it helps.  Its been going on for months and seems to be happening more frequently. Colonoscopy done in 2021.  No change in bowels. No constipation.      ROS    Objective:     BP (!) 127/59   Pulse 77   Ht '5\' 6"'$  (1.676 m)   Wt 198 lb (89.8 kg)   LMP 03/07/2011   SpO2 100%   BMI 31.96 kg/m    Physical Exam Vitals and nursing note reviewed.  Constitutional:      Appearance: She is well-developed.  HENT:     Head: Normocephalic and atraumatic.  Eyes:     Conjunctiva/sclera: Conjunctivae normal.  Cardiovascular:     Rate and Rhythm: Normal rate and regular rhythm.     Heart sounds: Normal heart sounds.  Pulmonary:     Effort: Pulmonary effort is normal.     Breath sounds: Normal breath sounds.  Abdominal:     General: Bowel sounds are normal.     Palpations: Abdomen is soft. There is no mass.     Tenderness: There is no abdominal tenderness.  Skin:    General: Skin is warm and dry.     Coloration: Skin is not pale.  Neurological:     Mental Status: She is alert and oriented to person, place, and time.  Psychiatric:        Behavior: Behavior normal.      Results for orders placed or performed in visit on 01/27/22  POCT glycosylated hemoglobin (Hb A1C)  Result Value Ref Range   Hemoglobin A1C 5.8 (A) 4.0 - 5.6 %   HbA1c POC (<> result,  manual entry)     HbA1c, POC (prediabetic range)     HbA1c, POC (controlled diabetic range)        The 10-year ASCVD risk score (Arnett DK, et al., 2019) is: 2.4%    Assessment & Plan:   Problem List Items Addressed This Visit       Cardiovascular and Mediastinum   Essential hypertension    Well controlled. Continue current regimen. Follow up in  93mo      Relevant Medications   hydrochlorothiazide (HYDRODIURIL) 25 MG tablet   Other Relevant Orders   TSH   Lipid Panel w/reflex Direct LDL     Endocrine   IFG (impaired fasting glucose)    Stable. A1C looks great today!!       Relevant Orders   POCT glycosylated hemoglobin (Hb A1C) (Completed)   TSH   Lipid Panel w/reflex Direct LDL   Hypothyroid - Primary    Due to recheck TSH.       Relevant Medications   SYNTHROID 137 MCG tablet   Other Relevant Orders   TSH   Other Visit Diagnoses     LUQ pain  Relevant Orders   Lipase   COMPLETE METABOLIC PANEL WITH GFR   CBC with Differential/Platelet       LUQ pain - unclear etiology.  Says feels almost like a muscle spasm but has been present for 6 months. Will start with labs and consider imaging.     Return in about 6 months (around 07/29/2022) for Pre-diabetes, Hypertension.    Beatrice Lecher, MD

## 2022-01-27 NOTE — Assessment & Plan Note (Signed)
Stable. A1C looks great today!!

## 2022-01-27 NOTE — Assessment & Plan Note (Signed)
Well controlled. Continue current regimen. Follow up in  6 mo  

## 2022-01-27 NOTE — Assessment & Plan Note (Signed)
Due to recheck TSH. 

## 2022-01-28 LAB — COMPLETE METABOLIC PANEL WITH GFR
AG Ratio: 1.7 (calc) (ref 1.0–2.5)
ALT: 48 U/L — ABNORMAL HIGH (ref 6–29)
AST: 31 U/L (ref 10–35)
Albumin: 4.7 g/dL (ref 3.6–5.1)
Alkaline phosphatase (APISO): 64 U/L (ref 37–153)
BUN: 20 mg/dL (ref 7–25)
CO2: 31 mmol/L (ref 20–32)
Calcium: 10.4 mg/dL (ref 8.6–10.4)
Chloride: 102 mmol/L (ref 98–110)
Creat: 0.81 mg/dL (ref 0.50–1.03)
Globulin: 2.8 g/dL (calc) (ref 1.9–3.7)
Glucose, Bld: 95 mg/dL (ref 65–99)
Potassium: 5.1 mmol/L (ref 3.5–5.3)
Sodium: 144 mmol/L (ref 135–146)
Total Bilirubin: 0.6 mg/dL (ref 0.2–1.2)
Total Protein: 7.5 g/dL (ref 6.1–8.1)
eGFR: 86 mL/min/{1.73_m2} (ref >=60)

## 2022-01-28 LAB — CBC WITH DIFFERENTIAL/PLATELET
Absolute Monocytes: 476 cells/uL (ref 200–950)
Basophils Absolute: 48 cells/uL (ref 0–200)
Basophils Relative: 0.7 %
Eosinophils Absolute: 109 cells/uL (ref 15–500)
Eosinophils Relative: 1.6 %
HCT: 47.2 % — ABNORMAL HIGH (ref 35.0–45.0)
Hemoglobin: 16.1 g/dL — ABNORMAL HIGH (ref 11.7–15.5)
Lymphs Abs: 2672 cells/uL (ref 850–3900)
MCH: 30.9 pg (ref 27.0–33.0)
MCHC: 34.1 g/dL (ref 32.0–36.0)
MCV: 90.6 fL (ref 80.0–100.0)
MPV: 11.3 fL (ref 7.5–12.5)
Monocytes Relative: 7 %
Neutro Abs: 3495 cells/uL (ref 1500–7800)
Neutrophils Relative %: 51.4 %
Platelets: 330 10*3/uL (ref 140–400)
RBC: 5.21 10*6/uL — ABNORMAL HIGH (ref 3.80–5.10)
RDW: 12.7 % (ref 11.0–15.0)
Total Lymphocyte: 39.3 %
WBC: 6.8 10*3/uL (ref 3.8–10.8)

## 2022-01-28 LAB — LIPID PANEL W/REFLEX DIRECT LDL
Cholesterol: 213 mg/dL — ABNORMAL HIGH (ref <200)
HDL: 59 mg/dL (ref >=50)
LDL Cholesterol (Calc): 117 mg/dL (calc) — ABNORMAL HIGH
Non-HDL Cholesterol (Calc): 154 mg/dL (calc) — ABNORMAL HIGH (ref <130)
Total CHOL/HDL Ratio: 3.6 (calc) (ref <5.0)
Triglycerides: 241 mg/dL — ABNORMAL HIGH (ref <150)

## 2022-01-28 LAB — TSH: TSH: 1.6 mIU/L

## 2022-01-28 LAB — LIPASE: Lipase: 25 U/L (ref 7–60)

## 2022-01-29 ENCOUNTER — Encounter: Payer: Self-pay | Admitting: Family Medicine

## 2022-01-29 ENCOUNTER — Other Ambulatory Visit: Payer: Self-pay | Admitting: Family Medicine

## 2022-01-29 DIAGNOSIS — D582 Other hemoglobinopathies: Secondary | ICD-10-CM

## 2022-01-29 NOTE — Progress Notes (Signed)
Kim Livingston, your ALT liver enzymes still just a little elevated similar to what it has been for the last several years.  Kidney function is normal.  Your hemoglobin jumped up again.  I am not quite sure why similar to about 9 years ago.  We will need to recheck that in a couple of week with a repeat CBC and an iron panel.  Your LDL cholesterol is just mildly elevated similar to last time.  Your thyroid looks great.  And lipase is normal no sign of pancreatitis.  Could consider doing some imaging of your abdomen.  We would need to do a CAT scan to take a look.  Let me know if you would like to move forward with that.

## 2022-02-26 LAB — IRON,TIBC AND FERRITIN PANEL
%SAT: 27 % (calc) (ref 16–45)
Ferritin: 154 ng/mL (ref 16–232)
Iron: 102 ug/dL (ref 45–160)
TIBC: 379 mcg/dL (calc) (ref 250–450)

## 2022-02-26 LAB — CBC
HCT: 43.1 % (ref 35.0–45.0)
Hemoglobin: 15 g/dL (ref 11.7–15.5)
MCH: 31.6 pg (ref 27.0–33.0)
MCHC: 34.8 g/dL (ref 32.0–36.0)
MCV: 90.7 fL (ref 80.0–100.0)
MPV: 11.1 fL (ref 7.5–12.5)
Platelets: 287 10*3/uL (ref 140–400)
RBC: 4.75 10*6/uL (ref 3.80–5.10)
RDW: 12.7 % (ref 11.0–15.0)
WBC: 7.5 10*3/uL (ref 3.8–10.8)

## 2022-02-27 NOTE — Progress Notes (Signed)
Your lab work is within acceptable range and there are no concerning findings.   ?

## 2022-07-19 ENCOUNTER — Encounter: Payer: Self-pay | Admitting: Family Medicine

## 2022-07-19 ENCOUNTER — Ambulatory Visit: Payer: Managed Care, Other (non HMO) | Admitting: Family Medicine

## 2022-07-19 VITALS — BP 123/59 | HR 79 | Ht 66.0 in | Wt 203.0 lb

## 2022-07-19 DIAGNOSIS — I10 Essential (primary) hypertension: Secondary | ICD-10-CM

## 2022-07-19 DIAGNOSIS — R7301 Impaired fasting glucose: Secondary | ICD-10-CM

## 2022-07-19 DIAGNOSIS — D582 Other hemoglobinopathies: Secondary | ICD-10-CM | POA: Diagnosis not present

## 2022-07-19 DIAGNOSIS — E039 Hypothyroidism, unspecified: Secondary | ICD-10-CM | POA: Diagnosis not present

## 2022-07-19 LAB — POCT GLYCOSYLATED HEMOGLOBIN (HGB A1C): Hemoglobin A1C: 5.5 % (ref 4.0–5.6)

## 2022-07-19 NOTE — Assessment & Plan Note (Signed)
Due to recheck TSH, noticing some hypothyroid sxs.

## 2022-07-19 NOTE — Assessment & Plan Note (Signed)
Well controlled. Continue current regimen. Follow up in  6 mo  

## 2022-07-19 NOTE — Progress Notes (Signed)
   Established Patient Office Visit  Subjective   Patient ID: Kim Livingston, female    DOB: [birth year]  Age: 57 y.o. MRN: [id number]  Chief Complaint  Patient presents with   Hypothyroidism   ifg    HPI  Hypertension- Pt denies chest pain, SOB, dizziness, or heart palpitations.  Taking meds as directed w/o problems.  Denies medication side effects.    Impaired fasting glucose-no increased thirst or urination. No symptoms consistent with hypoglycemia.has been walking daily and cut out sugar.    Hypothyroid - has noticed more constipation, dry skin and some et gain.       ROS    Objective:     BP (!) 123/59   Pulse 79   Ht 5\' 6"  (1.676 m)   Wt 203 lb (92.1 kg)   LMP 03/07/2011   SpO2 98%   BMI 32.77 kg/m    Physical Exam Vitals and nursing note reviewed.  Constitutional:      Appearance: She is well-developed.  HENT:     Head: Normocephalic and atraumatic.  Cardiovascular:     Rate and Rhythm: Normal rate and regular rhythm.     Heart sounds: Normal heart sounds.  Pulmonary:     Effort: Pulmonary effort is normal.     Breath sounds: Normal breath sounds.  Musculoskeletal:     Cervical back: Neck supple. No rigidity or tenderness.  Skin:    General: Skin is warm and dry.  Neurological:     Mental Status: She is alert and oriented to person, place, and time.  Psychiatric:        Behavior: Behavior normal.      Results for orders placed or performed in visit on 07/19/22  POCT glycosylated hemoglobin (Hb A1C)  Result Value Ref Range   Hemoglobin A1C 5.5 4.0 - 5.6 %   HbA1c POC (<> result, manual entry)     HbA1c, POC (prediabetic range)     HbA1c, POC (controlled diabetic range)        The 10-year ASCVD risk score (Arnett DK, et al., 2019) is: 2.5%    Assessment & Plan:   Problem List Items Addressed This Visit       Cardiovascular and Mediastinum   Essential hypertension    Well controlled. Continue current regimen. Follow up in  6 mo        Relevant Orders   CBC with Differential/Platelet   COMPLETE METABOLIC PANEL WITH GFR     Endocrine   IFG (impaired fasting glucose) - Primary    Looks great today!!!!!      Relevant Orders   POCT glycosylated hemoglobin (Hb A1C) (Completed)   CBC with Differential/Platelet   COMPLETE METABOLIC PANEL WITH GFR   Hypothyroid    Due to recheck TSH, noticing some hypothyroid sxs.        Relevant Orders   TSH   Other Visit Diagnoses     Elevated hemoglobin (HCC)       Relevant Orders   CBC with Differential/Platelet       Return in about 6 months (around 01/18/2023) for Hypertension and thyroid .    Nani Gasser, MD

## 2022-07-19 NOTE — Assessment & Plan Note (Signed)
Looks great today 

## 2022-08-04 LAB — TSH: TSH: 0.25 mIU/L — ABNORMAL LOW

## 2022-08-04 LAB — CBC WITH DIFFERENTIAL/PLATELET
Absolute Monocytes: 573 cells/uL (ref 200–950)
Basophils Absolute: 90 cells/uL (ref 0–200)
Basophils Relative: 1.3 %
Eosinophils Absolute: 131 cells/uL (ref 15–500)
Eosinophils Relative: 1.9 %
HCT: 43.2 % (ref 35.0–45.0)
Hemoglobin: 14.5 g/dL (ref 11.7–15.5)
Lymphs Abs: 3146 cells/uL (ref 850–3900)
MCH: 30.9 pg (ref 27.0–33.0)
MCHC: 33.6 g/dL (ref 32.0–36.0)
MCV: 92.1 fL (ref 80.0–100.0)
MPV: 11 fL (ref 7.5–12.5)
Monocytes Relative: 8.3 %
Neutro Abs: 2960 cells/uL (ref 1500–7800)
Neutrophils Relative %: 42.9 %
Platelets: 307 10*3/uL (ref 140–400)
RBC: 4.69 10*6/uL (ref 3.80–5.10)
RDW: 13 % (ref 11.0–15.0)
Total Lymphocyte: 45.6 %
WBC: 6.9 10*3/uL (ref 3.8–10.8)

## 2022-08-04 LAB — COMPLETE METABOLIC PANEL WITH GFR
AG Ratio: 1.7 (calc) (ref 1.0–2.5)
ALT: 52 U/L — ABNORMAL HIGH (ref 6–29)
AST: 36 U/L — ABNORMAL HIGH (ref 10–35)
Albumin: 4.3 g/dL (ref 3.6–5.1)
Alkaline phosphatase (APISO): 68 U/L (ref 37–153)
BUN: 14 mg/dL (ref 7–25)
CO2: 33 mmol/L — ABNORMAL HIGH (ref 20–32)
Calcium: 9.4 mg/dL (ref 8.6–10.4)
Chloride: 101 mmol/L (ref 98–110)
Creat: 0.75 mg/dL (ref 0.50–1.03)
Globulin: 2.5 g/dL (calc) (ref 1.9–3.7)
Glucose, Bld: 84 mg/dL (ref 65–99)
Potassium: 3.9 mmol/L (ref 3.5–5.3)
Sodium: 140 mmol/L (ref 135–146)
Total Bilirubin: 0.5 mg/dL (ref 0.2–1.2)
Total Protein: 6.8 g/dL (ref 6.1–8.1)
eGFR: 94 mL/min/{1.73_m2} (ref >=60)

## 2022-08-06 ENCOUNTER — Other Ambulatory Visit: Payer: Self-pay | Admitting: Physician Assistant

## 2022-08-06 ENCOUNTER — Encounter: Payer: Self-pay | Admitting: Family Medicine

## 2022-08-06 MED ORDER — SYNTHROID 125 MCG PO TABS: 125.0000 ug | ORAL_TABLET | Freq: Every day | ORAL | 1 refills | Status: DC

## 2022-08-06 NOTE — Progress Notes (Signed)
Thyroid is too suppressed. We should lower your synthroid to daily. Do you take brand name only? Recheck labs in 6 weeks.   My name is Tandy Gaw PA-C filling in for Dr. Judie Petit while out of office.

## 2022-08-09 ENCOUNTER — Other Ambulatory Visit: Payer: Self-pay | Admitting: *Deleted

## 2022-08-09 DIAGNOSIS — I10 Essential (primary) hypertension: Secondary | ICD-10-CM

## 2022-08-09 MED ORDER — HYDROCHLOROTHIAZIDE 25 MG PO TABS: 25.0000 mg | ORAL_TABLET | Freq: Every day | ORAL | 1 refills | Status: DC

## 2022-09-26 ENCOUNTER — Encounter: Payer: Self-pay | Admitting: Family Medicine

## 2022-09-27 ENCOUNTER — Other Ambulatory Visit: Payer: Self-pay

## 2022-09-27 DIAGNOSIS — E039 Hypothyroidism, unspecified: Secondary | ICD-10-CM

## 2022-09-30 NOTE — Progress Notes (Signed)
Hi Kim Livingston, your thyroid looks perfect this time.  Let us know if you need assistance in getting your mammogram scheduled for this year.  It looks like you are due this month.

## 2022-10-04 ENCOUNTER — Encounter: Payer: Self-pay | Admitting: Family Medicine

## 2022-10-04 MED ORDER — SYNTHROID 125 MCG PO TABS: 125.0000 ug | ORAL_TABLET | Freq: Every day | ORAL | 5 refills | Status: DC

## 2022-10-05 LAB — HM MAMMOGRAPHY

## 2022-11-04 ENCOUNTER — Encounter: Payer: Self-pay | Admitting: Family Medicine

## 2023-01-18 ENCOUNTER — Encounter: Payer: Self-pay | Admitting: Family Medicine

## 2023-01-18 ENCOUNTER — Telehealth: Payer: Self-pay | Admitting: Family Medicine

## 2023-01-18 ENCOUNTER — Ambulatory Visit: Payer: Managed Care, Other (non HMO) | Admitting: Family Medicine

## 2023-01-18 VITALS — BP 122/54 | HR 92 | Ht 66.0 in | Wt 202.0 lb

## 2023-01-18 DIAGNOSIS — E039 Hypothyroidism, unspecified: Secondary | ICD-10-CM | POA: Diagnosis not present

## 2023-01-18 DIAGNOSIS — Z23 Encounter for immunization: Secondary | ICD-10-CM

## 2023-01-18 DIAGNOSIS — I10 Essential (primary) hypertension: Secondary | ICD-10-CM

## 2023-01-18 DIAGNOSIS — E7800 Pure hypercholesterolemia, unspecified: Secondary | ICD-10-CM

## 2023-01-18 DIAGNOSIS — R7301 Impaired fasting glucose: Secondary | ICD-10-CM | POA: Diagnosis not present

## 2023-01-18 LAB — POCT GLYCOSYLATED HEMOGLOBIN (HGB A1C): Hemoglobin A1C: 5.8 % — AB (ref 4.0–5.6)

## 2023-01-18 NOTE — Assessment & Plan Note (Addendum)
A1C is stable at 5.8.  doing a great job. F/U in 6 months.

## 2023-01-18 NOTE — Telephone Encounter (Signed)
Unable to add requested lab order at this time. Specimen has not been received by the lab for processing. Will add the lab via [facility] portal when available.

## 2023-01-18 NOTE — Progress Notes (Signed)
   Established Patient Office Visit  Subjective   Patient ID: Kim Livingston, female    DOB: 08-09-1965  Age: 57 y.o. MRN: [id number]  Chief Complaint  Patient presents with   Hypothyroidism   Hypertension    HPI  Hypertension- Pt denies chest pain, SOB, dizziness, or heart palpitations.  Taking meds as directed w/o problems.  Denies medication side effects.    Impaired fasting glucose-no increased thirst or urination. No symptoms consistent with hypoglycemia.     ROS    Objective:     BP (!) 122/54   Pulse 92   Ht 5\' 6"  (1.676 m)   Wt 202 lb (91.6 kg)   LMP 03/07/2011   SpO2 99%   BMI 32.60 kg/m    Physical Exam Vitals and nursing note reviewed.  Constitutional:      Appearance: Normal appearance.  HENT:     Head: Normocephalic and atraumatic.  Eyes:     Conjunctiva/sclera: Conjunctivae normal.  Cardiovascular:     Rate and Rhythm: Normal rate and regular rhythm.  Pulmonary:     Effort: Pulmonary effort is normal.     Breath sounds: Normal breath sounds.  Musculoskeletal:     Cervical back: Neck supple.  Lymphadenopathy:     Cervical: No cervical adenopathy.  Skin:    General: Skin is warm and dry.  Neurological:     Mental Status: She is alert.  Psychiatric:        Mood and Affect: Mood normal.      Results for orders placed or performed in visit on 01/18/23  POCT HgB A1C  Result Value Ref Range   Hemoglobin A1C 5.8 (A) 4.0 - 5.6 %   HbA1c POC (<> result, manual entry)     HbA1c, POC (prediabetic range)     HbA1c, POC (controlled diabetic range)        The 10-year ASCVD risk score (Arnett DK, et al., 2019) is: 2.4%    Assessment & Plan:   Problem List Items Addressed This Visit       Cardiovascular and Mediastinum   Essential hypertension - Primary    Well controlled. Continue current regimen. Follow up in  6 mo       Relevant Orders   Lipid Panel With LDL/HDL Ratio   CMP14+EGFR   POCT HgB A1C (Completed)     Endocrine   IFG  (impaired fasting glucose)    A1C is stable at 5.8.  doing a great job. F/U in 6 months.        Relevant Orders   Lipid Panel With LDL/HDL Ratio   CMP14+EGFR   POCT HgB A1C (Completed)   Hypothyroid    Like her thyroid is off just slightly she has been a little bit more constipated and sometimes that is one of her signs.  Last TSH looked great about 3 months ago.  To recheck TSH level.      Other Visit Diagnoses     Elevated LDL cholesterol level       Relevant Orders   Lipid Panel With LDL/HDL Ratio   CMP14+EGFR   POCT HgB A1C (Completed)   Encounter for immunization       Relevant Orders   Flu vaccine trivalent PF, 6mos and older(Flulaval,Afluria,Fluarix,Fluzone) (Completed)       Return in about 6 months (around 07/19/2023) for Hypertension, Pre-diabetes.    Nani Gasser, MD

## 2023-01-18 NOTE — Assessment & Plan Note (Addendum)
Like her thyroid is off just slightly she has been a little bit more constipated and sometimes that is one of her signs.  Last TSH looked great about 3 months ago.  To recheck TSH level.

## 2023-01-18 NOTE — Assessment & Plan Note (Signed)
Well controlled. Continue current regimen. Follow up in  6 mo  

## 2023-01-19 ENCOUNTER — Encounter: Payer: Self-pay | Admitting: Family Medicine

## 2023-01-19 DIAGNOSIS — R748 Abnormal levels of other serum enzymes: Secondary | ICD-10-CM

## 2023-01-19 LAB — CMP14+EGFR
ALT: 57 [IU]/L — ABNORMAL HIGH (ref 0–32)
AST: 38 [IU]/L (ref 0–40)
Albumin: 4.4 g/dL (ref 3.8–4.9)
Alkaline Phosphatase: 85 [IU]/L (ref 44–121)
BUN/Creatinine Ratio: 17 (ref 9–23)
BUN: 15 mg/dL (ref 6–24)
Bilirubin Total: 0.5 mg/dL (ref 0.0–1.2)
CO2: 27 mmol/L (ref 20–29)
Calcium: 9.9 mg/dL (ref 8.7–10.2)
Chloride: 98 mmol/L (ref 96–106)
Creatinine, Ser: 0.89 mg/dL (ref 0.57–1.00)
Globulin, Total: 2.5 g/dL (ref 1.5–4.5)
Glucose: 138 mg/dL — ABNORMAL HIGH (ref 70–99)
Potassium: 3.9 mmol/L (ref 3.5–5.2)
Sodium: 141 mmol/L (ref 134–144)
Total Protein: 6.9 g/dL (ref 6.0–8.5)
eGFR: 77 mL/min/{1.73_m2} (ref >59)

## 2023-01-19 LAB — LIPID PANEL WITH LDL/HDL RATIO
Cholesterol, Total: 211 mg/dL — ABNORMAL HIGH (ref 100–199)
HDL: 46 mg/dL (ref >39)
LDL Chol Calc (NIH): 109 mg/dL — ABNORMAL HIGH (ref 0–99)
LDL/HDL Ratio: 2.4 {ratio} (ref 0.0–3.2)
Triglycerides: 330 mg/dL — ABNORMAL HIGH (ref 0–149)
VLDL Cholesterol Cal: 56 mg/dL — ABNORMAL HIGH (ref 5–40)

## 2023-01-19 NOTE — Telephone Encounter (Signed)
Task completed. Per provider's request - TSH added via [facility] web portal.

## 2023-01-19 NOTE — Telephone Encounter (Signed)
Please call to add on today the TSH

## 2023-01-19 NOTE — Progress Notes (Signed)
Hi Kim Livingston, your Triglycerides are up.  Just encourage you to continue to work on healthy diet and exercise. The ALT liver enzyme is still slightly elevated but stable.  We can get an up to date [place] for your liver. Last one was in 2020.  Would you be OK with that? No urgency, we can schedule at your convenience.

## 2023-01-21 LAB — SPECIMEN STATUS REPORT

## 2023-01-21 LAB — TSH: TSH: 1.77 u[IU]/mL (ref 0.450–4.500)

## 2023-01-21 NOTE — Progress Notes (Signed)
Hi Denise, thyroid looks perfect!!!!!!

## 2023-02-05 ENCOUNTER — Other Ambulatory Visit: Payer: Self-pay | Admitting: Family Medicine

## 2023-02-05 DIAGNOSIS — I10 Essential (primary) hypertension: Secondary | ICD-10-CM

## 2023-02-07 ENCOUNTER — Encounter: Payer: Self-pay | Admitting: Family Medicine

## 2023-04-11 ENCOUNTER — Other Ambulatory Visit: Payer: Self-pay | Admitting: Family Medicine

## 2023-06-21 LAB — LIPID PANEL
Cholesterol: 194 (ref 0–200)
HDL: 42 (ref 35–70)
LDL Cholesterol: 121
LDl/HDL Ratio: 0.4
Triglycerides: 154 (ref 40–160)

## 2023-06-21 LAB — HEMOGLOBIN A1C: Hemoglobin A1C: 5.8

## 2023-06-21 LAB — LAB REPORT - SCANNED: A1c: 5.8

## 2023-06-21 LAB — BASIC METABOLIC PANEL WITH GFR: Glucose: 102

## 2023-09-18 ENCOUNTER — Encounter: Payer: Self-pay | Admitting: Family Medicine

## 2023-10-11 LAB — HM MAMMOGRAPHY

## 2023-10-20 ENCOUNTER — Encounter: Payer: Self-pay | Admitting: Family Medicine

## 2023-10-20 ENCOUNTER — Ambulatory Visit

## 2023-10-20 ENCOUNTER — Ambulatory Visit (INDEPENDENT_AMBULATORY_CARE_PROVIDER_SITE_OTHER)

## 2023-10-20 ENCOUNTER — Ambulatory Visit: Admitting: Family Medicine

## 2023-10-20 VITALS — BP 111/51 | HR 59 | Ht 66.0 in | Wt 191.0 lb

## 2023-10-20 DIAGNOSIS — R221 Localized swelling, mass and lump, neck: Secondary | ICD-10-CM

## 2023-10-20 DIAGNOSIS — K760 Fatty (change of) liver, not elsewhere classified: Secondary | ICD-10-CM

## 2023-10-20 DIAGNOSIS — Z23 Encounter for immunization: Secondary | ICD-10-CM | POA: Diagnosis not present

## 2023-10-20 DIAGNOSIS — I10 Essential (primary) hypertension: Secondary | ICD-10-CM | POA: Diagnosis not present

## 2023-10-20 DIAGNOSIS — R7301 Impaired fasting glucose: Secondary | ICD-10-CM

## 2023-10-20 DIAGNOSIS — R21 Rash and other nonspecific skin eruption: Secondary | ICD-10-CM

## 2023-10-20 DIAGNOSIS — E039 Hypothyroidism, unspecified: Secondary | ICD-10-CM | POA: Diagnosis not present

## 2023-10-20 DIAGNOSIS — M542 Cervicalgia: Secondary | ICD-10-CM | POA: Diagnosis not present

## 2023-10-20 DIAGNOSIS — Z124 Encounter for screening for malignant neoplasm of cervix: Secondary | ICD-10-CM

## 2023-10-20 DIAGNOSIS — G8929 Other chronic pain: Secondary | ICD-10-CM

## 2023-10-20 MED ORDER — CYCLOBENZAPRINE HCL 10 MG PO TABS: 10.0000 mg | ORAL_TABLET | Freq: Every evening | ORAL | 1 refills | Status: AC | PRN

## 2023-10-20 MED ORDER — SYNTHROID 125 MCG PO TABS: 125.0000 ug | ORAL_TABLET | Freq: Every day | ORAL | 2 refills | Status: AC

## 2023-10-20 MED ORDER — NEOMYCIN-POLYMYXIN-DEXAMETH 3.5-10000-0.1 OP OINT: TOPICAL_OINTMENT | OPHTHALMIC | 1 refills | Status: AC

## 2023-10-20 MED ORDER — TRIAMCINOLONE ACETONIDE 0.1 % EX CREA: 1.0000 | TOPICAL_CREAM | Freq: Two times a day (BID) | CUTANEOUS | 0 refills | Status: AC

## 2023-10-20 MED ORDER — HYDROCHLOROTHIAZIDE 25 MG PO TABS: 25.0000 mg | ORAL_TABLET | Freq: Every day | ORAL | 1 refills | Status: AC

## 2023-10-20 NOTE — Progress Notes (Addendum)
 "  Established Patient Office Visit  Subjective  Patient ID: Kim Livingston, female    DOB: 25-Nov-1965  Age: 58 y.o. MRN: [id number]  Chief Complaint  Patient presents with   Hypertension    HPI Discussed the use of AI scribe software for clinical note transcription with the patient, who gave verbal consent to proceed.  History of Present Illness Kim Livingston is a 58 year old female who presents with neck pain and concerns about mammogram findings.  Neck pain - Neck pain for approximately eight weeks, localized to the left side - Pain described as tight, sometimes resembling gland or muscle discomfort - History of cervical fusion at age 31, which resolved previous neck issues - Current pain varies day to day, with associated popping and discomfort - Pain exacerbated by prolonged sitting and looking down - Relief achieved with heat, Biofreeze, and frequent use of Advil, which provides significant relief  Breast imaging abnormalities and associated concerns - Recent mammogram prompted call back for additional imaging and consultation due to changes noted - History of mammogram call backs over the past 15 years, with previous biopsies and significant tissue removal - Concern about potential connection between neck pain and mammogram findings  Weight loss and edema - intentional weight loss of 15 pounds over the past six weeks using the [employer] app - Reduction in shoe size, indicating previous swelling - Elimination of sugar from diet  Psychosocial stressors - Recent separation from husband after 37 years of marriage - Job change to a higher-level position with increased responsibilities and stress - Spent a month in the hospital with husband prior to separation, involving prolonged periods of sitting upright  Cutaneous symptoms - Persistent red and rough skin spot since COVID infection on posterior left neck. Needs refill on steroid cream that left at other house.   -  Worsening of skin lesion with seasonal changes  Hypertension and thyroid  status - Currently taking blood pressure medication - No concerns with blood pressure or thyroid  function   Brought in copy of labs from work from January.      ROS    Objective:     BP (!) 111/51   Pulse (!) 59   Ht 5' 6 (1.676 m)   Wt 191 lb 0.6 oz (86.7 kg)   LMP 03/07/2011   SpO2 99%   BMI 30.83 kg/m    Physical Exam Vitals and nursing note reviewed.  Constitutional:      Appearance: Normal appearance.  HENT:     Head: Normocephalic and atraumatic.  Eyes:     Conjunctiva/sclera: Conjunctivae normal.  Cardiovascular:     Rate and Rhythm: Normal rate and regular rhythm.  Pulmonary:     Effort: Pulmonary effort is normal.     Breath sounds: Normal breath sounds.  Musculoskeletal:     Comments: Normal flexion, extension. Pain on the left with rotation to the left and pain on the left with flexion to the left.    Skin:    General: Skin is warm and dry.  Neurological:     Mental Status: She is alert.  Psychiatric:        Mood and Affect: Mood normal.      No results found for any visits on 10/20/23.    The 10-year ASCVD risk score (Arnett DK, et al., 2019) is: 2.8%    Assessment & Plan:   Problem List Items Addressed This Visit       Cardiovascular and Mediastinum  Essential hypertension - Primary   Relevant Medications   hydrochlorothiazide  (HYDRODIURIL ) 25 MG tablet   Other Relevant Orders   CMP14+EGFR   TSH     Digestive   Fatty liver   Relevant Orders   CMP14+EGFR   TSH     Endocrine   IFG (impaired fasting glucose)   Relevant Orders   CMP14+EGFR   TSH   Hemoglobin A1c   Hypothyroid   Relevant Medications   SYNTHROID  125 MCG tablet   Other Relevant Orders   CMP14+EGFR   TSH   Other Visit Diagnoses       Encounter for immunization       Relevant Orders   Flu vaccine trivalent PF, 6mos and older(Flulaval,Afluria,Fluarix,Fluzone) (Completed)      Screening for cervical cancer       Relevant Orders   Ambulatory referral to Obstetrics / Gynecology     Rash       Relevant Medications   triamcinolone  cream (KENALOG ) 0.1 %     Neck pain on left side       Relevant Medications   cyclobenzaprine  (FLEXERIL ) 10 MG tablet   Other Relevant Orders   DG Cervical Spine Complete     Mass of left side of neck       Relevant Orders   US  Soft Tissue Head/Neck (NON-THYROID )      Assessment and Plan Assessment & Plan Chronic left-sided neck pain with prior cervical fusion Chronic neck pain with history of cervical fusion, likely musculoskeletal. Considered muscle knot or lymph node involvement. Muscle relaxers may reduce ibuprofen use. Physical therapy effective in 90% of cases. MRI if symptoms persist or worsen. - Order cervical spine x-ray to assess structural changes. - Order ultrasound to evaluate for lymph node involvement or muscle knot. - Prescribe muscle relaxers for evening use. - Advise on ibuprofen use, not exceeding 800 mg every 8 hours, and consider alternating with Tylenol .  Obesity Obesity with recent 15-pound weight loss using [employer] app. Motivated to continue weight loss, eliminated sugar from diet. She is doing great!!!!   Impaired fasting glucose Impaired fasting glucose with interest in monitoring A1c levels. Eliminated sugar from diet.  Essential hypertension Essential hypertension well-controlled with current medication.  Hypothyroidism Hypothyroidism with no major skin or hair changes. Fasting for lab testing. - Order thyroid  function tests.  Dermatitis of scalp/hairline Dermatitis of scalp/hairline, worsens with seasonal changes. Requests over-the-counter treatment recommendation. - Recommend over-the-counter cream for dermatitis.   Return in about 6 months (around 04/18/2024) for Hypertension.    Dorothyann Byars, MD "

## 2023-10-21 ENCOUNTER — Ambulatory Visit: Payer: Self-pay | Admitting: Family Medicine

## 2023-10-21 LAB — CMP14+EGFR
ALT: 47 IU/L — ABNORMAL HIGH (ref 0–32)
AST: 32 IU/L (ref 0–40)
Albumin: 4.8 g/dL (ref 3.8–4.9)
Alkaline Phosphatase: 72 IU/L (ref 44–121)
BUN/Creatinine Ratio: 15 (ref 9–23)
BUN: 11 mg/dL (ref 6–24)
Bilirubin Total: 0.7 mg/dL (ref 0.0–1.2)
CO2: 26 mmol/L (ref 20–29)
Calcium: 10.2 mg/dL (ref 8.7–10.2)
Chloride: 97 mmol/L (ref 96–106)
Creatinine, Ser: 0.73 mg/dL (ref 0.57–1.00)
Globulin, Total: 2.3 g/dL (ref 1.5–4.5)
Glucose: 87 mg/dL (ref 70–99)
Potassium: 4 mmol/L (ref 3.5–5.2)
Sodium: 139 mmol/L (ref 134–144)
Total Protein: 7.1 g/dL (ref 6.0–8.5)
eGFR: 96 mL/min/1.73 (ref >59)

## 2023-10-21 LAB — HEMOGLOBIN A1C
Est. average glucose Bld gHb Est-mCnc: 114 mg/dL
Hgb A1c MFr Bld: 5.6 % (ref 4.8–5.6)

## 2023-10-21 LAB — TSH: TSH: 0.609 u[IU]/mL (ref 0.450–4.500)

## 2023-10-21 NOTE — Progress Notes (Signed)
 Hi Kim Livingston, ALT liver enzyme still up a little but looking better!! A1C for sugars looks great!!!  Thyroid  looks great!

## 2023-10-24 NOTE — Progress Notes (Signed)
 Hi Denise, great news ultrasound looks okay.  So that means they did not see a discrete mass or cyst or abnormal lymph node which is awesome.  So lets just keep an eye on the area for now if anything changes please let me know.

## 2023-10-25 LAB — HM MAMMOGRAPHY

## 2023-10-28 ENCOUNTER — Other Ambulatory Visit: Payer: Self-pay | Admitting: Family Medicine

## 2023-10-28 ENCOUNTER — Encounter: Payer: Self-pay | Admitting: Family Medicine

## 2023-10-28 DIAGNOSIS — M542 Cervicalgia: Secondary | ICD-10-CM

## 2023-10-28 DIAGNOSIS — M5030 Other cervical disc degeneration, unspecified cervical region: Secondary | ICD-10-CM

## 2023-10-28 NOTE — Progress Notes (Signed)
 Orders Placed This Encounter  Procedures  . Ambulatory referral to Physical Therapy    Referral Priority:   Routine    Referral Type:   Physical Medicine    Referral Reason:   Specialty Services Required    Requested Specialty:   Physical Therapy    Number of Visits Requested:   1

## 2023-10-28 NOTE — Progress Notes (Signed)
 Hi Denise, x-ray of the neck shows some straightening this is typically present when there is a lot of muscle spasm going on.  And you do have some degenerative changes or arthritis at multiple levels.  Securely you have some disc space narrowing at C4-5 and C5-6.  And there are evidence of fusion between Chait C6 and 7.  You also have some narrowing where the nerve roots come out at C3 all the way through C6.  I would recommend starting with physical therapy especially to help with some of the muscle spasm going on and see if we cannot get pain and mobility improved.  If you are still not improving at that point then I would recommend referral to Ortho or sports med.  Let me know if you be okay with PT.  And then let me know if you have a preferred location or provider.  We have a great department here.

## 2023-11-01 ENCOUNTER — Encounter: Payer: Self-pay | Admitting: Family Medicine

## 2023-11-13 NOTE — Therapy (Unsigned)
 OUTPATIENT PHYSICAL THERAPY CERVICAL EVALUATION   Patient Name: Kim Livingston MRN: [id number] DOB:1965-05-20, 58 y.o., female Today's Date: 11/14/2023  END OF SESSION:  PT End of Session - 11/14/23 1044     Visit Number 1    Number of Visits 16    Date for Recertification  01/09/24    Authorization Type cigna no dedutable; copay $60 - no auth required    Authorization Time Period calendar year    Authorization - Visit Number 1    Authorization - Number of Visits 60    PT Start Time 0845    PT Stop Time 0930    PT Time Calculation (min) 45 min    Activity Tolerance Patient tolerated treatment well          Past Medical History:  Diagnosis Date   Abscess of peritoneum (HCC) 2016   Edema    Fatty liver    GERD (gastroesophageal reflux disease)    hx   Heart murmur    Hypertension    borderline   Hypothyroidism    PONV (postoperative nausea and vomiting)    used scop patch-did well   Wears glasses    Past Surgical History:  Procedure Laterality Date   ABDOMINAL HYSTERECTOMY     Cervix is present.    ADENOIDECTOMY     APPENDECTOMY     BREAST LUMPECTOMY WITH NEEDLE LOCALIZATION Right 05/05/2012   Procedure: BREAST LUMPECTOMY WITH NEEDLE LOCALIZATION;  Surgeon: Elon CHRISTELLA Pacini, MD;  Location: [facility];  Service: General;  Laterality: Right;   cervical back fusion     Upper back.    CESAREAN SECTION     CHOLECYSTECTOMY     TONSILLECTOMY     WISDOM TOOTH EXTRACTION     Patient Active Problem List   Diagnosis Date Noted   IFG (impaired fasting glucose) 07/30/2021   Polyarthralgia 03/11/2020   Pain in both feet 11/16/2018   Fatty liver 08/28/2018   Essential hypertension 03/25/2016   Elevated liver enzymes 08/01/2015   Intraductal papilloma of breast 05/29/2012   Rosacea 10/19/2011   RHINITIS 11/08/2010   Hypothyroid 11/29/2005    PCP: Dr Dorothyann JONETTA Byars  REFERRING PROVIDER: Dr Dorothyann JONETTA Byars  REFERRING DIAG: cervical  radiculopathy   THERAPY DIAG:  Cervical dysfunction  Other symptoms and signs involving the musculoskeletal system  Muscle weakness (generalized)  Abnormal posture  Rationale for Evaluation and Treatment: Rehabilitation  ONSET DATE: 07/31/23  SUBJECTIVE:  SUBJECTIVE STATEMENT: Patient report flare up of chronic L sided neck pain. She has a history of cervical fusion ~ 30 years ago. Current symptoms started 07/31/23 with no known injury. Symptoms have persisted and have not changed.  Hand dominance: Right  PERTINENT HISTORY:  Chronic L sided neck pain; prior cervical fusion C 5/6 ~30 years ago; HTN; hypothyroidism; obesity; bilat foot pain; dermatitis   PAIN:  Are you having pain? Yes: NPRS scale: 4/10 Pain location: L cervical spine; up toward base of skull   Pain description: constant; discomfort; tightness  Aggravating factors: lying down with a pillow; looking down Relieving factors: biofreeze; heat; massager; advil; supporting neck with thin blanket when lying down   PRECAUTIONS: Cervical prior cervical fusion   RED FLAGS: None     WEIGHT BEARING RESTRICTIONS: No  FALLS:  Has patient fallen in last 6 months? 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  DIAGNOSTIC FINDINGS:  Xray 10/27/23: Straightening  of the cervical spine. Suboptimal visualization of C7 and cervicothoracic junction. Dens and lateral masses are within normal limits. Multilevel degenerative osteophytes. Moderate disc space narrowing C4-C5 and C5-C6. Interbody fusion at C6-C7. Multilevel facet degenerative changes. Suspect diffuse foraminal narrowing bilaterally C3 through C6.   IMPRESSION: Straightening of the cervical spine with multilevel degenerative changes. Interbody fusion at C6-C7  PATIENT SURVEYS:  NDI:  NECK DISABILITY INDEX  Date: 11/14/23 Score  Pain intensity 2 = The pain is moderate at the moment  2. Personal care (washing, dressing, etc.) 2 = It is painful to look after myself and I am slow and careful  3. Lifting 3 = Pain prevents me from lifting heavy weights but I can manage light to medium   weights if they are conveniently positioned  4. Reading 1 = I can read as much as I want to with slight pain in my neck  5. Headaches 3 = I have moderate headaches, which come frequently  6. Concentration 1 =  I can concentrate fully when I want to with slight difficulty   7. Work 1 =  I can only do my usual work, but no more  8. Driving 1 =  I can drive my car as long as I want with slight pain in my neck  9. Sleeping 2 = My sleep is mildly disturbed (1-2 hrs sleepless)  10. Recreation 1 =  I am able to engage in all my recreation activities, with some pain in my neck  Total 17/50   Minimum Detectable Change (90% confidence): 5 points or 10% points  COGNITION: Overall cognitive status: Within functional limits for tasks assessed  SENSATION: WFL  POSTURE: Patient presents with head forward posture with increased thoracic kyphosis; shoulders rounded and elevated; scapulae abducted and rotated along the thoracic spine; head of the humerus anterior in orientation.   PALPATION: Muscular tightness L and R cervical paraspinals; pecs; upper traps; leveator; biceps; anterior deltoid   CERVICAL ROM:   Active  ROM A/PROM (deg) eval  Flexion 48  Extension 6  pain L  Right lateral flexion 9 pain L  Left lateral flexion 12 pain L  Right rotation 40 pain L  Left rotation 32 pain L    (Blank rows = not tested)  UPPER EXTREMITY ROM:  Active ROM Right eval Left eval  Shoulder flexion Tight end range Tight end range  Shoulder extension    Shoulder abduction Tight end range Tight end range  Shoulder adduction    Shoulder extension    Shoulder internal rotation Tight end range Tight end range  Shoulder external rotation    Elbow flexion    Elbow extension    Wrist flexion    Wrist extension    Wrist ulnar deviation    Wrist radial deviation    Wrist pronation    Wrist supination     (Blank rows = not tested)  UPPER EXTREMITY MMT: strength bilat UE's WFL - poor strength in posterior shoulder girdle - middle and lower traps  MMT Right eval Left eval  Shoulder flexion    Shoulder extension    Shoulder abduction    Shoulder adduction    Shoulder extension    Shoulder internal rotation    Shoulder external rotation    Middle trapezius    Lower trapezius    Elbow flexion    Elbow extension    Wrist flexion    Wrist extension    Wrist ulnar deviation    Wrist radial deviation    Wrist pronation    Wrist supination    Grip strength     (Blank rows = not tested)  CERVICAL SPECIAL TESTS: cervical compression/distraction negative    TREATMENT DATE: POC; HEP education                                                                                                                                  PATIENT EDUCATION:  Education details: POC; HEP  Person educated: Patient Education method: Programmer, multimedia, Facilities manager, Actor cues, Verbal cues, and Handouts Education comprehension: verbalized understanding, returned demonstration, verbal cues required, tactile cues required, and needs further education  HOME EXERCISE PROGRAM: Access Code: [email or web address] URL:  [email or web address] Date: 11/14/2023 Prepared by: Deaisha Welborn  Exercises - Seated Cervical Retraction  - 2 x daily - 7 x weekly - 1-2 sets - 5-10 reps - 10 sec  hold - Supine Cervical Retraction with Towel  - 2 x daily - 7 x weekly - 1 sets - 5-10 reps - 10 sec  hold - Seated Scapular Retraction  - 2 x daily - 7 x weekly - 1-2 sets - 10 reps - 10 sec  hold - Shoulder External Rotation and Scapular Retraction  - 1-2 x daily - 7 x weekly - 1 sets - 10 reps - 3-5 sec   hold - Standing Shoulder W at Wall  - 1-2 x daily - 7 x weekly - 1 sets - 10 reps - 3 sec  hold -  Doorway Pec Stretch at 60 Degrees Abduction  - 3 x daily - 7 x weekly - 1 sets - 3 reps - Doorway Pec Stretch at 90 Degrees Abduction  - 3 x daily - 7 x weekly - 1 sets - 3 reps - 30 seconds  hold - Doorway Pec Stretch at 120 Degrees Abduction  - 3 x daily - 7 x weekly - 1 sets - 3 reps - 30 second hold  hold  Patient Education - Office Posture  ASSESSMENT:  CLINICAL IMPRESSION: Patient is a 58 y.o. female who was seen today for physical therapy evaluation and treatment for chronic L sided neck pain with cervical DDD. She reports symptoms present since mid summer with no known injury. She has done well following cervical fusion 30+ years ago and does not recall any neck pain or problems. 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 Patient presents to clinic with forward posture and alignment; limited and painful cervical mobility/ROM; tightness through the thoracic spine; limited end range shoulder ROM; difficulty with functional activities; pain on a daily basis; intermittent headaches. Patient will benefit form PT to address problems identified.   OBJECTIVE IMPAIRMENTS: decreased activity tolerance, decreased mobility, decreased ROM, decreased strength, increased fascial restrictions, increased muscle spasms, improper body mechanics, postural dysfunction, and pain.   ACTIVITY  LIMITATIONS: carrying, lifting, sitting, and sleeping  PARTICIPATION LIMITATIONS: cleaning, driving, shopping, community activity, occupation, and yard work  PERSONAL FACTORS: Fitness, Past/current experiences, Profession, Time since onset of injury/illness/exacerbation, and 1-2 comorbidities: as noted in history  are also affecting patient's functional outcome.   REHAB POTENTIAL: Good  CLINICAL DECISION MAKING: Evolving/moderate complexity  EVALUATION COMPLEXITY: Moderate   GOALS: Goals reviewed with patient? Yes  SHORT TERM GOALS: Target date: 12/12/2023   Independent in initial HEP  Baseline:  Goal status: INITIAL  2.  Improve cervical ROM by 5-7 degrees in flexion and lateral flexion  Baseline:  Goal status: INITIAL  3.  Patient reports ability to sleep with a pillow  Baseline:  Goal status: INITIAL  LONG TERM GOALS: Target date: 01/09/2024  Decrease pain in L cervical spine and shoulder by 75-90% Baseline:  Goal status: INITIAL  2.  Pain free cervical ROM WFL's  Baseline:  Goal status: INITIAL  3.  Patient reports and demonstrates modifications in work and home ergonomics to avoid continued irritation of musculoskeletal symptoms  Baseline:  Goal status: INITIAL  4.  Improve posture and alignment with patient to demonstrate more upright posture with activation of posterior shoulder girdle musculature  Baseline:  Goal status: INITIAL  5.  Independent in advanced HEP  Baseline:  Goal status: INITIAL  6.  Improve NDI by 5 points  Baseline: 17/50 Goal status: INITIAL   PLAN:  PT FREQUENCY: 2x/week  PT DURATION: 8 weeks  PLANNED INTERVENTIONS: 97164- PT Re-evaluation, 97110-Therapeutic exercises, 97530- Therapeutic activity, 97112- Neuromuscular re-education, 97535- Self Care, [id number]- Manual therapy, Patient/Family education, Taping, and Joint mobilization  PLAN FOR NEXT SESSION: review and progress exercises; continue with education re ergonomics and  body mechanics; manual work and modalities as indicated    [employer], PT 11/14/2023, 10:47 AM

## 2023-11-14 ENCOUNTER — Encounter: Payer: Self-pay | Admitting: Rehabilitative and Restorative Service Providers"

## 2023-11-14 ENCOUNTER — Other Ambulatory Visit: Payer: Self-pay

## 2023-11-14 ENCOUNTER — Ambulatory Visit: Attending: Family Medicine | Admitting: Rehabilitative and Restorative Service Providers"

## 2023-11-14 DIAGNOSIS — M539 Dorsopathy, unspecified: Secondary | ICD-10-CM | POA: Insufficient documentation

## 2023-11-14 DIAGNOSIS — M5030 Other cervical disc degeneration, unspecified cervical region: Secondary | ICD-10-CM | POA: Insufficient documentation

## 2023-11-14 DIAGNOSIS — R29898 Other symptoms and signs involving the musculoskeletal system: Secondary | ICD-10-CM | POA: Insufficient documentation

## 2023-11-14 DIAGNOSIS — M6281 Muscle weakness (generalized): Secondary | ICD-10-CM | POA: Diagnosis present

## 2023-11-14 DIAGNOSIS — R293 Abnormal posture: Secondary | ICD-10-CM | POA: Diagnosis present

## 2023-11-14 DIAGNOSIS — M542 Cervicalgia: Secondary | ICD-10-CM | POA: Insufficient documentation

## 2023-11-16 ENCOUNTER — Encounter: Payer: Self-pay | Admitting: Rehabilitative and Restorative Service Providers"

## 2023-11-16 ENCOUNTER — Ambulatory Visit: Attending: Family Medicine | Admitting: Rehabilitative and Restorative Service Providers"

## 2023-11-16 DIAGNOSIS — R293 Abnormal posture: Secondary | ICD-10-CM | POA: Insufficient documentation

## 2023-11-16 DIAGNOSIS — R29898 Other symptoms and signs involving the musculoskeletal system: Secondary | ICD-10-CM | POA: Diagnosis present

## 2023-11-16 DIAGNOSIS — M539 Dorsopathy, unspecified: Secondary | ICD-10-CM | POA: Diagnosis present

## 2023-11-16 DIAGNOSIS — M6281 Muscle weakness (generalized): Secondary | ICD-10-CM | POA: Diagnosis present

## 2023-11-16 NOTE — Therapy (Addendum)
 OUTPATIENT PHYSICAL THERAPY CERVICAL TREATMENT   Patient Name: Kim Livingston MRN: [id number] DOB:1965-07-26, 58 y.o., female Today's Date: 11/16/2023  END OF SESSION:  PT End of Session - 11/16/23 0709     Visit Number 2    Number of Visits 16    Date for Recertification  01/09/24    Authorization Type cigna no dedutable; copay $60 - no auth required    Authorization Time Period calendar year    Authorization - Visit Number 2    Authorization - Number of Visits 60    PT Start Time 0710    PT Stop Time 0800    PT Time Calculation (min) 50 min    Activity Tolerance Patient tolerated treatment well          Past Medical History:  Diagnosis Date   Abscess of peritoneum (HCC) 2016   Edema    Fatty liver    GERD (gastroesophageal reflux disease)    hx   Heart murmur    Hypertension    borderline   Hypothyroidism    PONV (postoperative nausea and vomiting)    used scop patch-did well   Wears glasses    Past Surgical History:  Procedure Laterality Date   ABDOMINAL HYSTERECTOMY     Cervix is present.    ADENOIDECTOMY     APPENDECTOMY     BREAST LUMPECTOMY WITH NEEDLE LOCALIZATION Right 05/05/2012   Procedure: BREAST LUMPECTOMY WITH NEEDLE LOCALIZATION;  Surgeon: Elon CHRISTELLA Pacini, MD;  Location: [facility];  Service: General;  Laterality: Right;   cervical back fusion     Upper back.    CESAREAN SECTION     CHOLECYSTECTOMY     TONSILLECTOMY     WISDOM TOOTH EXTRACTION     Patient Active Problem List   Diagnosis Date Noted   IFG (impaired fasting glucose) 07/30/2021   Polyarthralgia 03/11/2020   Pain in both feet 11/16/2018   Fatty liver 08/28/2018   Essential hypertension 03/25/2016   Elevated liver enzymes 08/01/2015   Intraductal papilloma of breast 05/29/2012   Rosacea 10/19/2011   RHINITIS 11/08/2010   Hypothyroid 11/29/2005    PCP: Dr Dorothyann JONETTA Byars  REFERRING PROVIDER: Dr Dorothyann JONETTA Byars  REFERRING DIAG: cervical  radiculopathy   THERAPY DIAG:  Cervical dysfunction  Other symptoms and signs involving the musculoskeletal system  Muscle weakness (generalized)  Abnormal posture  Rationale for Evaluation and Treatment: Rehabilitation  ONSET DATE: 07/31/23  SUBJECTIVE:  SUBJECTIVE STATEMENT: Feeling better. Found a pool noodle and has been using pool noodle at home and work. Did not need to take as much advil yesterday.   Eval: Patient report flare up of chronic L sided neck pain. She has a history of cervical fusion ~ 30 years ago. Current symptoms started 07/31/23 with no known injury. Symptoms have persisted and have not changed.  Hand dominance: Right  PERTINENT HISTORY:  Chronic L sided neck pain; prior cervical fusion C 5/6 ~30 years ago; HTN; hypothyroidism; obesity; bilat foot pain; dermatitis   PAIN:  Are you having pain? Yes: NPRS scale: 3/10 Pain location: L cervical spine; up toward base of skull   Pain description: constant; discomfort; tightness  Aggravating factors: lying down with a pillow; looking down Relieving factors: biofreeze; heat; massager; advil; supporting neck with thin blanket when lying down   PRECAUTIONS: Cervical prior cervical fusion    WEIGHT BEARING RESTRICTIONS: No  FALLS:  Has patient fallen in last 6 months? 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  DIAGNOSTIC FINDINGS:  Xray 10/27/23: Straightening of the cervical spine. Suboptimal visualization of C7 and cervicothoracic junction. Dens and lateral masses are within normal limits. Multilevel degenerative osteophytes. Moderate disc space narrowing C4-C5 and C5-C6. Interbody fusion at C6-C7. Multilevel facet degenerative changes. Suspect diffuse foraminal narrowing bilaterally C3 through C6.   IMPRESSION: Straightening of the cervical spine with multilevel degenerative changes. Interbody fusion at C6-C7  PATIENT SURVEYS:  NDI:  NECK DISABILITY INDEX  Date: 11/14/23 Score  Pain intensity 2 = The pain is moderate at the moment  2. Personal care (washing, dressing, etc.) 2 = It is painful to look after myself and I am slow and careful  3. Lifting 3 = Pain prevents me from lifting heavy weights but I can manage light to medium   weights if they are conveniently positioned  4. Reading 1 = I can read as much as I want to with slight pain in my neck  5. Headaches 3 = I have moderate headaches, which come frequently  6. Concentration 1 =  I can concentrate fully when I want to with slight difficulty   7. Work 1 =  I can only do my usual work, but no more  8. Driving 1 =  I can drive my car as long as I want with slight pain in my neck  9. Sleeping 2 = My sleep is mildly disturbed (1-2 hrs sleepless)  10. Recreation 1 =  I am able to engage in all my recreation activities, with some pain in my neck  Total 17/50   Minimum Detectable Change (90% confidence): 5 points or 10% points   SENSATION: WFL  POSTURE: Patient presents with head forward posture with increased thoracic kyphosis; shoulders rounded and elevated; scapulae abducted and rotated along the thoracic spine; head of the humerus anterior in orientation.   PALPATION: Muscular tightness L and R cervical paraspinals; pecs; upper traps; leveator; biceps; anterior  deltoid   CERVICAL ROM:   Active  ROM A/PROM (deg) eval  Flexion 48  Extension 6 pain L  Right lateral flexion 9 pain L  Left lateral flexion 12 pain L  Right rotation 40 pain L  Left rotation 32 pain L    (Blank rows = not tested)  UPPER EXTREMITY ROM:  Active ROM Right eval Left eval  Shoulder flexion Tight end range Tight end range  Shoulder extension    Shoulder abduction Tight end range Tight end range  Shoulder adduction    Shoulder extension    Shoulder internal rotation Tight end range Tight end range  Shoulder external rotation    Elbow flexion    Elbow extension    Wrist flexion    Wrist extension    Wrist ulnar deviation    Wrist radial deviation    Wrist pronation    Wrist supination     (Blank rows = not tested)  UPPER EXTREMITY MMT: strength bilat UE's WFL - poor strength in posterior shoulder girdle - middle and lower traps  MMT Right eval Left eval  Shoulder flexion    Shoulder extension    Shoulder abduction    Shoulder adduction    Shoulder extension    Shoulder internal rotation    Shoulder external rotation    Middle trapezius    Lower trapezius    Elbow flexion    Elbow extension    Wrist flexion    Wrist extension    Wrist ulnar deviation    Wrist radial deviation    Wrist pronation    Wrist supination    Grip strength     (Blank rows = not tested)  CERVICAL SPECIAL TESTS: cervical compression/distraction negative    [facility] Adult PT Treatment:                                                DATE: 11/16/23 Therapeutic Exercise: Doorway stretch 3 positions 30 sec x 2  Manual Therapy: STM cervical and thoracic spine patient supine  Neuromuscular re-ed: Chin tuck with noodle 10 sec x 10 Scap squeeze with noodle 10 sec x 10  Therapeutic Activity: Scap squeeze with ER with noodle  W with noodle x 10  Backward shoulder rolls  Thoracic extension with coregeous ball 5-10 sec x 10  Self Care:  Office ergonomics                                                                                                                            Modaities:   Moist heat cervical spine x 10 min    PATIENT EDUCATION:  Education details: POC; HEP  Person educated: Patient Education method: Programmer, multimedia, Facilities manager, Actor cues, Verbal cues, and Handouts Education comprehension: verbalized understanding, returned demonstration, verbal cues required, tactile cues required, and needs further education  HOME EXERCISE PROGRAM: Access Code: [email or web address] URL:  [email or web address] Date: 11/16/2023 Prepared by: Tanis Burnley  Exercises - Seated Cervical Retraction  - 2 x daily - 7 x weekly - 1-2 sets - 5-10 reps - 10 sec  hold - Supine Cervical Retraction with Towel  - 2 x daily - 7 x weekly - 1 sets - 5-10 reps - 10 sec  hold - Seated Scapular Retraction  - 2 x daily - 7 x weekly - 1-2 sets - 10 reps - 10 sec  hold - Shoulder External Rotation and Scapular Retraction  - 1-2 x daily - 7 x weekly - 1 sets - 10 reps - 3-5 sec   hold - Standing Shoulder W at Wall  - 1-2 x daily - 7 x weekly - 1 sets - 10 reps - 3 sec  hold - Doorway Pec Stretch at 60 Degrees Abduction  - 3 x daily - 7 x weekly - 1 sets - 3 reps - Doorway Pec Stretch at 90 Degrees Abduction  - 3 x daily - 7 x weekly - 1 sets - 3 reps - 30 seconds  hold - Doorway Pec Stretch at 120 Degrees Abduction  - 3 x daily - 7 x weekly - 1 sets - 3 reps - 30 second hold  hold - Standing Anatomical Position with Scapular Retraction and Depression at Wall  - 2 x daily - 7 x weekly - 1 sets - 5-10 reps - 5-10 sec  hold - Supine Diaphragmatic Breathing  - 2 x daily - 7 x weekly - 1 sets - 10 reps - 4-6 sec  hold  Patient Education - Office Posture  ASSESSMENT:  CLINICAL IMPRESSION: Positive response to initial treatment and home program with patient reporting improvement in discomfort. She has been working on home program. Reviewed and progressed exercises with focus on  stretching, postural correction, postural strengthening. Trial of manual work through the cervical and thoracic musculature.    EVAL: Patient is a 58 y.o. female who was seen today for physical therapy evaluation and treatment for chronic L sided neck pain with cervical DDD. She reports symptoms present since mid summer with no known injury. She has done well following cervical fusion 30+ years ago and does not recall any neck pain or problems. 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 Patient presents to clinic with forward posture and alignment; limited and painful cervical mobility/ROM; tightness through the thoracic spine; limited end range shoulder ROM; difficulty with functional activities; pain on a daily basis; intermittent headaches. Patient will benefit form PT to address problems identified.   OBJECTIVE IMPAIRMENTS: decreased activity tolerance, decreased mobility, decreased ROM, decreased strength, increased fascial restrictions, increased muscle spasms, improper body mechanics, postural dysfunction, and pain.    GOALS: Goals reviewed with patient? Yes  SHORT TERM GOALS: Target date: 12/12/2023   Independent in initial HEP  Baseline:  Goal status: INITIAL  2.  Improve cervical ROM by 5-7 degrees in flexion and lateral flexion  Baseline:  Goal status: INITIAL  3.  Patient reports ability to sleep with a pillow  Baseline:  Goal status: INITIAL  LONG TERM GOALS: Target date: 01/09/2024  Decrease pain in L cervical spine and shoulder by 75-90% Baseline:  Goal status: INITIAL  2.  Pain free cervical ROM WFL's  Baseline:  Goal status: INITIAL  3.  Patient reports and demonstrates modifications in work and home ergonomics to avoid continued irritation of musculoskeletal symptoms  Baseline:  Goal status: INITIAL  4.  Improve posture and alignment with patient to demonstrate more upright posture with activation of posterior shoulder  girdle musculature  Baseline:  Goal status: INITIAL  5.  Independent in advanced HEP  Baseline:  Goal status: INITIAL  6.  Improve NDI by 5 points  Baseline: 17/50 Goal status: INITIAL   PLAN:  PT FREQUENCY: 2x/week  PT DURATION: 8 weeks  PLANNED INTERVENTIONS: 97164- PT Re-evaluation, 97110-Therapeutic exercises, 97530- Therapeutic activity, 97112- Neuromuscular re-education, 97535- Self Care, [id number]- Manual therapy, Patient/Family education, Taping, and Joint mobilization  PLAN FOR NEXT SESSION: review and progress exercises; continue with education re ergonomics and body mechanics; manual work and modalities as indicated    [employer], PT 11/16/2023, 8:02 AM

## 2023-11-24 ENCOUNTER — Ambulatory Visit: Admitting: Rehabilitative and Restorative Service Providers"

## 2023-11-24 ENCOUNTER — Encounter: Payer: Self-pay | Admitting: Rehabilitative and Restorative Service Providers"

## 2023-11-24 DIAGNOSIS — R293 Abnormal posture: Secondary | ICD-10-CM

## 2023-11-24 DIAGNOSIS — M539 Dorsopathy, unspecified: Secondary | ICD-10-CM

## 2023-11-24 DIAGNOSIS — R29898 Other symptoms and signs involving the musculoskeletal system: Secondary | ICD-10-CM

## 2023-11-24 DIAGNOSIS — M6281 Muscle weakness (generalized): Secondary | ICD-10-CM

## 2023-11-24 NOTE — Therapy (Signed)
 OUTPATIENT PHYSICAL THERAPY CERVICAL TREATMENT   Patient Name: Kim Livingston MRN: [id number] DOB:1965/07/10, 58 y.o., female Today's Date: 11/24/2023  END OF SESSION:  PT End of Session - 11/24/23 0803     Visit Number 3    Number of Visits 16    Date for Recertification  01/09/24    Authorization Type cigna no dedutable; copay $60 - no auth required    Authorization Time Period calendar year    Authorization - Visit Number 3    Authorization - Number of Visits 60    PT Start Time 0800    PT Stop Time 0848    PT Time Calculation (min) 48 min    Activity Tolerance Patient tolerated treatment well          Past Medical History:  Diagnosis Date   Abscess of peritoneum (HCC) 2016   Edema    Fatty liver    GERD (gastroesophageal reflux disease)    hx   Heart murmur    Hypertension    borderline   Hypothyroidism    PONV (postoperative nausea and vomiting)    used scop patch-did well   Wears glasses    Past Surgical History:  Procedure Laterality Date   ABDOMINAL HYSTERECTOMY     Cervix is present.    ADENOIDECTOMY     APPENDECTOMY     BREAST LUMPECTOMY WITH NEEDLE LOCALIZATION Right 05/05/2012   Procedure: BREAST LUMPECTOMY WITH NEEDLE LOCALIZATION;  Surgeon: Elon CHRISTELLA Pacini, MD;  Location: [facility];  Service: General;  Laterality: Right;   cervical back fusion     Upper back.    CESAREAN SECTION     CHOLECYSTECTOMY     TONSILLECTOMY     WISDOM TOOTH EXTRACTION     Patient Active Problem List   Diagnosis Date Noted   IFG (impaired fasting glucose) 07/30/2021   Polyarthralgia 03/11/2020   Pain in both feet 11/16/2018   Fatty liver 08/28/2018   Essential hypertension 03/25/2016   Elevated liver enzymes 08/01/2015   Intraductal papilloma of breast 05/29/2012   Rosacea 10/19/2011   RHINITIS 11/08/2010   Hypothyroid 11/29/2005    PCP: Dr Dorothyann JONETTA Byars  REFERRING PROVIDER: Dr Dorothyann JONETTA Byars  REFERRING DIAG: cervical  radiculopathy   THERAPY DIAG:  Cervical dysfunction  Other symptoms and signs involving the musculoskeletal system  Muscle weakness (generalized)  Abnormal posture  Rationale for Evaluation and Treatment: Rehabilitation  ONSET DATE: 07/31/23  SUBJECTIVE:  SUBJECTIVE STATEMENT: Patient reports that she figured out that part of the irritation in neck and shoulders is from mowing her 4 acres of lawn. She will make modifications to mower seat and take breaks. Generally feeling better. Using the pool noodle and has been using pool noodle at home and work. Not taking as much advil.   Eval: Patient report flare up of chronic L sided neck pain. She has a history of cervical fusion ~ 30 years ago. Current symptoms started 07/31/23 with no known injury. Symptoms have persisted and have not changed.  Hand dominance: Right  PERTINENT HISTORY:  Chronic L sided neck pain; prior cervical fusion C 5/6 ~30 years ago; HTN; hypothyroidism; obesity; bilat foot pain; dermatitis   PAIN:  Are you having pain? Yes: NPRS scale: 0/10 Pain location: L cervical spine; up toward base of skull   Pain description: constant; discomfort; tightness  Aggravating factors: lying down with a pillow; looking down Relieving factors: biofreeze; heat; massager; advil; supporting neck with thin blanket when lying down   PRECAUTIONS: Cervical prior cervical fusion    WEIGHT BEARING RESTRICTIONS: No  FALLS:  Has patient fallen in last 6 months? 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  DIAGNOSTIC FINDINGS:  Xray 10/27/23: Straightening of the cervical spine. Suboptimal visualization of C7 and cervicothoracic junction. Dens and lateral masses are within normal limits. Multilevel degenerative osteophytes. Moderate disc space narrowing C4-C5 and C5-C6. Interbody fusion at C6-C7. Multilevel facet degenerative changes. Suspect diffuse foraminal narrowing bilaterally C3 through C6.   IMPRESSION: Straightening of the cervical spine with multilevel degenerative changes. Interbody fusion at C6-C7  PATIENT SURVEYS:  NDI:  NECK DISABILITY INDEX  Date: 11/14/23 Score  Pain intensity 2 = The pain is moderate at the moment  2. Personal care (washing, dressing, etc.) 2 = It is painful to look after myself and I am slow and careful  3. Lifting 3 = Pain prevents me from lifting heavy weights but I can manage light to medium   weights if they are conveniently positioned  4. Reading 1 = I can read as much as I want to with slight pain in my neck  5. Headaches 3 = I have moderate headaches, which come frequently  6. Concentration 1 =  I can concentrate fully when I want to with slight difficulty   7. Work 1 =  I can only do my usual work, but no more  8. Driving 1 =  I can drive my car as long as I want with slight pain in my neck  9. Sleeping 2 = My sleep is mildly disturbed (1-2 hrs sleepless)  10. Recreation 1 =  I am able to engage in all my recreation activities, with some pain in my neck  Total 17/50   Minimum Detectable Change (90% confidence): 5 points or 10% points   SENSATION: WFL  POSTURE: Patient presents with head forward posture with increased thoracic kyphosis; shoulders rounded and elevated; scapulae abducted and rotated along the thoracic spine;  head  of the humerus anterior in orientation.   PALPATION: Muscular tightness L and R cervical paraspinals; pecs; upper traps; leveator; biceps; anterior deltoid   CERVICAL ROM:   Active ROM A/PROM (deg) eval  Flexion 48  Extension 6 pain L  Right lateral flexion 9 pain L  Left lateral flexion 12 pain L  Right rotation 40 pain L  Left rotation 32 pain L    (Blank rows = not tested)  UPPER EXTREMITY ROM:  Active ROM Right eval Left eval  Shoulder flexion Tight end range Tight end range  Shoulder extension    Shoulder abduction Tight end range Tight end range  Shoulder adduction    Shoulder extension    Shoulder internal rotation Tight end range Tight end range  Shoulder external rotation    Elbow flexion    Elbow extension    Wrist flexion    Wrist extension    Wrist ulnar deviation    Wrist radial deviation    Wrist pronation    Wrist supination     (Blank rows = not tested)  UPPER EXTREMITY MMT: strength bilat UE's WFL - poor strength in posterior shoulder girdle - middle and lower traps  MMT Right eval Left eval  Shoulder flexion    Shoulder extension    Shoulder abduction    Shoulder adduction    Shoulder extension    Shoulder internal rotation    Shoulder external rotation    Middle trapezius    Lower trapezius    Elbow flexion    Elbow extension    Wrist flexion    Wrist extension    Wrist ulnar deviation    Wrist radial deviation    Wrist pronation    Wrist supination    Grip strength     (Blank rows = not tested)  CERVICAL SPECIAL TESTS: cervical compression/distraction negative    [facility] Adult PT Treatment:                                                DATE: 11/24/23 Therapeutic Exercise: Doorway stretch 3 positions 30 sec x 2  Lateral cervical flexion 3 sec x 3 R/L  Manual Therapy: STM cervical and thoracic spine patient supine  Release work through the clavicle and 1st rib  Manual cervical traction 10-15 sec hold x 4  Neuromuscular  re-ed: Chin tuck with noodle 10 sec x 10 Scap squeeze with noodle 10 sec x 10  Therapeutic Activity: Scap squeeze with ER yellow TB with noodle 3 sec x 10  W with noodle yellow TB x 10  Backward shoulder rolls  Thoracic extension with coregeous ball 5-10 sec x 10  Row blue TB 3 sec x 10  Bow and arrow 3 sec x 10 R/L  Self Care:  Office ergonomics   Sleeping positions and use of hand towel to support  c-spine  Modaities:   Moist heat cervical spine x 10 min   [facility] Adult PT Treatment:                                                DATE: 11/16/23 Therapeutic Exercise: Doorway stretch 3 positions 30 sec x 2  Manual Therapy: STM cervical and thoracic spine patient supine  Neuromuscular re-ed: Chin tuck with noodle 10 sec x 10 Scap squeeze with noodle 10 sec x 10  Therapeutic Activity: Scap squeeze with ER with noodle  W with noodle x 10  Backward shoulder rolls  Thoracic extension with coregeous ball 5-10 sec x 10  Self Care:  Office ergonomics                                                                                                                           Modaities:   Moist heat cervical spine x 10 min    PATIENT EDUCATION:  Education details: POC; HEP  Person educated: Patient Education method: Programmer, multimedia, Facilities manager, Actor cues, Verbal cues, and Handouts Education comprehension: verbalized understanding, returned demonstration, verbal cues required, tactile cues required, and needs further education  HOME EXERCISE PROGRAM: Access Code: [email or web address] URL: [email or web address] Date: 11/24/2023 Prepared by: Lillyanne Bradburn  Exercises - Seated Cervical Retraction  - 2 x daily - 7 x weekly - 1-2 sets - 5-10 reps - 10 sec  hold - Supine Cervical Retraction with Towel  - 2 x daily - 7 x weekly - 1 sets - 5-10 reps - 10 sec  hold - Seated  Scapular Retraction  - 2 x daily - 7 x weekly - 1-2 sets - 10 reps - 10 sec  hold - Shoulder External Rotation and Scapular Retraction  - 1-2 x daily - 7 x weekly - 1 sets - 10 reps - 3-5 sec   hold - Standing Shoulder W at Wall  - 1-2 x daily - 7 x weekly - 1 sets - 10 reps - 3 sec  hold - Doorway Pec Stretch at 60 Degrees Abduction  - 3 x daily - 7 x weekly - 1 sets - 3 reps - Doorway Pec Stretch at 90 Degrees Abduction  - 3 x daily - 7 x weekly - 1 sets - 3 reps - 30 seconds  hold - Doorway Pec Stretch at 120 Degrees Abduction  - 3 x daily - 7 x weekly - 1 sets - 3 reps - 30 second hold  hold - Standing Anatomical Position with Scapular Retraction and Depression at Wall  - 2 x daily - 7 x weekly - 1 sets - 5-10 reps - 5-10 sec  hold - Supine Diaphragmatic Breathing  - 2 x daily - 7 x weekly - 1 sets - 10 reps - 4-6 sec  hold - Shoulder External Rotation and Scapular Retraction with  Resistance  - 2 x daily - 7 x weekly - 1 sets - 10 reps - 3-5 sec  hold - Standing Bilateral Low Shoulder Row with Anchored Resistance  - 2 x daily - 7 x weekly - 1-3 sets - 10 reps - 2-3 sec  hold - Shoulder W - External Rotation with Resistance  - 2 x daily - 7 x weekly - 1-2 sets - 10 reps - 3 sec  hold - Drawing Bow  - 1 x daily - 7 x weekly - 1 sets - 10 reps - 3 sec  hold - Seated Cervical Sidebending AROM  - 2 x daily - 7 x weekly - 1 sets - 3-5 reps - 5-10 sec  hold  Patient Education - Office Posture  ASSESSMENT:  CLINICAL IMPRESSION: Continued improvement in cervical pain with decreased pain. Patient identified trigger for pain is mowing 4 acres of her lawn. Will modify mower and take breaks. Reviewed and progressed exercises with focus on strengthening posterior shoulder girdle. Karna has continued muscular tightness in the cervical spine musculature as well as limited mobility/ROM. She has been working on home program and making modifications for work Financial controller. Continued manual work through the  cervical musculature to address stiffness following cervical ROM exercise.    EVAL: Patient is a 58 y.o. female who was seen today for physical therapy evaluation and treatment for chronic L sided neck pain with cervical DDD. She reports symptoms present since mid summer with no known injury. She has done well following cervical fusion 30+ years ago and does not recall any neck pain or problems. 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 Patient presents to clinic with forward posture and alignment; limited and painful cervical mobility/ROM; tightness through the thoracic spine; limited end range shoulder ROM; difficulty with functional activities; pain on a daily basis; intermittent headaches. Patient will benefit form PT to address problems identified.   OBJECTIVE IMPAIRMENTS: decreased activity tolerance, decreased mobility, decreased ROM, decreased strength, increased fascial restrictions, increased muscle spasms, improper body mechanics, postural dysfunction, and pain.    GOALS: Goals reviewed with patient? Yes  SHORT TERM GOALS: Target date: 12/12/2023   Independent in initial HEP  Baseline:  Goal status: INITIAL  2.  Improve cervical ROM by 5-7 degrees in flexion and lateral flexion  Baseline:  Goal status: INITIAL  3.  Patient reports ability to sleep with a pillow  Baseline:  Goal status: INITIAL  LONG TERM GOALS: Target date: 01/09/2024  Decrease pain in L cervical spine and shoulder by 75-90% Baseline:  Goal status: INITIAL  2.  Pain free cervical ROM WFL's  Baseline:  Goal status: INITIAL  3.  Patient reports and demonstrates modifications in work and home ergonomics to avoid continued irritation of musculoskeletal symptoms  Baseline:  Goal status: INITIAL  4.  Improve posture and alignment with patient to demonstrate more upright posture with activation of posterior shoulder girdle musculature  Baseline:  Goal status:  INITIAL  5.  Independent in advanced HEP  Baseline:  Goal status: INITIAL  6.  Improve NDI by 5 points  Baseline: 17/50 Goal status: INITIAL   PLAN:  PT FREQUENCY: 2x/week  PT DURATION: 8 weeks  PLANNED INTERVENTIONS: 97164- PT Re-evaluation, 97110-Therapeutic exercises, 97530- Therapeutic activity, 97112- Neuromuscular re-education, 97535- Self Care, [id number]- Manual therapy, Patient/Family education, Taping, and Joint mobilization  PLAN FOR NEXT SESSION: review and progress exercises; continue with education re ergonomics and body mechanics;  manual work and modalities as indicated  - progress with posterior shoulder girdle strengthening.    Ercia Crisafulli P Rushi Chasen, PT 11/24/2023, 8:48 AM

## 2023-11-29 ENCOUNTER — Encounter: Payer: Self-pay | Admitting: Physical Therapy

## 2023-11-29 ENCOUNTER — Ambulatory Visit: Admitting: Physical Therapy

## 2023-11-29 DIAGNOSIS — R29898 Other symptoms and signs involving the musculoskeletal system: Secondary | ICD-10-CM

## 2023-11-29 DIAGNOSIS — M539 Dorsopathy, unspecified: Secondary | ICD-10-CM | POA: Diagnosis not present

## 2023-11-29 DIAGNOSIS — R293 Abnormal posture: Secondary | ICD-10-CM

## 2023-11-29 DIAGNOSIS — M6281 Muscle weakness (generalized): Secondary | ICD-10-CM

## 2023-11-29 NOTE — Therapy (Signed)
 OUTPATIENT PHYSICAL THERAPY CERVICAL TREATMENT   Patient Name: Kim Livingston MRN: [id number] DOB:Jun 29, 1965, 58 y.o., female Today's Date: 11/29/2023  END OF SESSION:  PT End of Session - 11/29/23 1615     Visit Number 4    Number of Visits 16    Date for Recertification  01/09/24    Authorization Type [employer] no deductable; copay $60 - no auth required    PT Start Time 1615    PT Stop Time 1658    PT Time Calculation (min) 43 min           Past Medical History:  Diagnosis Date   Abscess of peritoneum (HCC) 2016   Edema    Fatty liver    GERD (gastroesophageal reflux disease)    hx   Heart murmur    Hypertension    borderline   Hypothyroidism    PONV (postoperative nausea and vomiting)    used scop patch-did well   Wears glasses    Past Surgical History:  Procedure Laterality Date   ABDOMINAL HYSTERECTOMY     Cervix is present.    ADENOIDECTOMY     APPENDECTOMY     BREAST LUMPECTOMY WITH NEEDLE LOCALIZATION Right 05/05/2012   Procedure: BREAST LUMPECTOMY WITH NEEDLE LOCALIZATION;  Surgeon: Elon CHRISTELLA Pacini, MD;  Location: [facility];  Service: General;  Laterality: Right;   cervical back fusion     Upper back.    CESAREAN SECTION     CHOLECYSTECTOMY     TONSILLECTOMY     WISDOM TOOTH EXTRACTION     Patient Active Problem List   Diagnosis Date Noted   IFG (impaired fasting glucose) 07/30/2021   Polyarthralgia 03/11/2020   Pain in both feet 11/16/2018   Fatty liver 08/28/2018   Essential hypertension 03/25/2016   Elevated liver enzymes 08/01/2015   Intraductal papilloma of breast 05/29/2012   Rosacea 10/19/2011   RHINITIS 11/08/2010   Hypothyroid 11/29/2005    PCP: Dr Dorothyann JONETTA Byars  REFERRING PROVIDER: Dr Dorothyann JONETTA Byars  REFERRING DIAG: cervical radiculopathy   THERAPY DIAG:  Cervical dysfunction  Other symptoms and signs involving the musculoskeletal system  Muscle weakness (generalized)  Abnormal  posture  Rationale for Evaluation and Treatment: Rehabilitation  ONSET DATE: 07/31/23  SUBJECTIVE:                                                                                                                                                                                                         SUBJECTIVE STATEMENT: 11/29/2023: pain is decreasing steadily. Feels like  her posture is improving. Has been diligent with pool noodle. No resting pain at present, does have tightness when turning head.   Eval: Patient report flare up of chronic L sided neck pain. She has a history of cervical fusion ~ 30 years ago. Current symptoms started 07/31/23 with no known injury. Symptoms have persisted and have not changed.  Hand dominance: Right  PERTINENT HISTORY:  Chronic L sided neck pain; prior cervical fusion C 5/6 ~30 years ago; HTN; hypothyroidism; obesity; bilat foot pain; dermatitis   PAIN:  Are you having pain? Yes: NPRS scale: 0/10 Pain location: L cervical spine; up toward base of skull   Pain description: constant; discomfort; tightness  Aggravating factors: lying down with a pillow; looking down Relieving factors: biofreeze; heat; massager; advil; supporting neck with thin blanket when lying down   PRECAUTIONS: Cervical prior cervical fusion    WEIGHT BEARING RESTRICTIONS: No  FALLS:  Has patient fallen in last 6 months? 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  DIAGNOSTIC FINDINGS:  Xray 10/27/23: Straightening of the cervical  spine. Suboptimal visualization of C7 and cervicothoracic junction. Dens and lateral masses are within normal limits. Multilevel degenerative osteophytes. Moderate disc space narrowing C4-C5 and C5-C6. Interbody fusion at C6-C7. Multilevel facet degenerative changes. Suspect diffuse foraminal narrowing bilaterally C3 through C6.   IMPRESSION: Straightening of the cervical spine with multilevel degenerative changes. Interbody fusion at C6-C7  PATIENT SURVEYS:  NDI:  NECK DISABILITY INDEX  Date: 11/14/23 Score  Pain intensity 2 = The pain is moderate at the moment  2. Personal care (washing, dressing, etc.) 2 = It is painful to look after myself and I am slow and careful  3. Lifting 3 = Pain prevents me from lifting heavy weights but I can manage light to medium   weights if they are conveniently positioned  4. Reading 1 = I can read as much as I want to with slight pain in my neck  5. Headaches 3 = I have moderate headaches, which come frequently  6. Concentration 1 =  I can concentrate fully when I want to with slight difficulty   7. Work 1 =  I can only do my usual work, but no more  8. Driving 1 =  I can drive my car as long as I want with slight pain in my neck  9. Sleeping 2 = My sleep is mildly disturbed (1-2 hrs sleepless)  10. Recreation 1 =  I am able to engage in all my recreation activities, with some pain in my neck  Total 17/50   Minimum Detectable Change (90% confidence): 5 points or 10% points   SENSATION: WFL  POSTURE: Patient presents with head forward posture with increased thoracic kyphosis; shoulders rounded and elevated; scapulae abducted and rotated along the thoracic spine; head of the humerus anterior in orientation.   PALPATION: Muscular tightness L and R cervical paraspinals; pecs; upper traps; leveator; biceps; anterior deltoid   CERVICAL ROM:   Active ROM A/PROM (deg) eval  Flexion 48  Extension 6 pain L  Right lateral flexion 9 pain L  Left  lateral flexion 12 pain L  Right rotation 40 pain L  Left rotation 32 pain L    (Blank rows = not tested)  UPPER EXTREMITY ROM:  Active ROM Right eval Left eval  Shoulder flexion Tight end range Tight end range  Shoulder extension    Shoulder abduction Tight end range Tight end range  Shoulder adduction    Shoulder extension    Shoulder internal rotation Tight end range Tight end range  Shoulder external rotation    Elbow flexion    Elbow extension    Wrist flexion    Wrist extension    Wrist ulnar deviation    Wrist radial deviation    Wrist pronation    Wrist supination     (Blank rows = not tested)  UPPER EXTREMITY MMT: strength bilat UE's WFL - poor strength in posterior shoulder girdle - middle and lower traps  MMT Right eval Left eval  Shoulder flexion    Shoulder extension    Shoulder abduction    Shoulder adduction    Shoulder extension    Shoulder internal rotation    Shoulder external rotation    Middle trapezius    Lower trapezius    Elbow flexion    Elbow extension    Wrist flexion    Wrist extension    Wrist ulnar deviation    Wrist radial deviation    Wrist pronation    Wrist supination    Grip strength     (Blank rows = not tested)  CERVICAL SPECIAL TESTS: cervical compression/distraction negative    [facility] Adult PT Treatment:                                                DATE: 11/29/23 Therapeutic Exercise: YB W w/ pool noodle x12, RB x10 Standing BIL scaption w/ pool noodle at wall x8;  Standing blue band row x10 Standing red band shoulder ext x8  Chin tuck w/ ball at wall x12 HEP update + education/discussion  Neuromuscular re-ed: RB bow and arrow x10 cues for reduced UT/elbow compensations  Standing BIL scaption YB at wrist <90 deg 2x10 Modified counter plank shoulder tap x8 BIL cues for core engagement     [facility] Adult PT Treatment:                                                DATE: 11/24/23 Therapeutic Exercise: Doorway  stretch 3 positions 30 sec x 2  Lateral cervical flexion 3 sec x 3 R/L  Manual Therapy: STM cervical and thoracic spine patient supine  Release work through the clavicle and 1st rib  Manual cervical traction 10-15 sec hold x 4  Neuromuscular re-ed: Chin tuck with noodle 10 sec x 10 Scap squeeze with noodle 10 sec x 10  Therapeutic Activity: Scap squeeze with ER yellow TB with noodle 3 sec x 10  W with noodle yellow TB x 10  Backward shoulder rolls  Thoracic extension with coregeous ball 5-10 sec x 10  Row blue TB 3 sec x 10  Bow and arrow 3 sec x 10 R/L  Self Care:  Designer, television/film set  Sleeping positions and use of hand towel to support  c-spine                                                                                                                         Modaities:   Moist heat cervical spine x 10 min   [facility] Adult PT Treatment:                                                DATE: 11/16/23 Therapeutic Exercise: Doorway stretch 3 positions 30 sec x 2  Manual Therapy: STM cervical and thoracic spine patient supine  Neuromuscular re-ed: Chin tuck with noodle 10 sec x 10 Scap squeeze with noodle 10 sec x 10  Therapeutic Activity: Scap squeeze with ER with noodle  W with noodle x 10  Backward shoulder rolls  Thoracic extension with coregeous ball 5-10 sec x 10  Self Care:  Office ergonomics                                                                                                                           Modaities:   Moist heat cervical spine x 10 min    PATIENT EDUCATION:  Education details: POC; HEP  Person educated: Patient Education method: Programmer, multimedia, Facilities manager, Actor cues, Verbal cues, and Handouts Education comprehension: verbalized understanding, returned demonstration, verbal cues required, tactile cues required, and needs further education  HOME EXERCISE PROGRAM: Access Code: [email or web address] URL: [email or web address] Date:  11/24/2023 Prepared by: Celyn Holt  Exercises - Seated Cervical Retraction  - 2 x daily - 7 x weekly - 1-2 sets - 5-10 reps - 10 sec  hold - Supine Cervical Retraction with Towel  - 2 x daily - 7 x weekly - 1 sets - 5-10 reps - 10 sec  hold - Seated Scapular Retraction  - 2 x daily - 7 x weekly - 1-2 sets - 10 reps - 10 sec  hold - Shoulder External Rotation and Scapular Retraction  - 1-2 x daily - 7 x weekly - 1 sets - 10 reps - 3-5 sec   hold - Standing Shoulder W at Wall  - 1-2 x daily - 7 x weekly - 1 sets - 10 reps - 3 sec  hold - Doorway Pec Stretch at 60 Degrees Abduction  - 3 x  daily - 7 x weekly - 1 sets - 3 reps - Doorway Pec Stretch at 90 Degrees Abduction  - 3 x daily - 7 x weekly - 1 sets - 3 reps - 30 seconds  hold - Doorway Pec Stretch at 120 Degrees Abduction  - 3 x daily - 7 x weekly - 1 sets - 3 reps - 30 second hold  hold - Standing Anatomical Position with Scapular Retraction and Depression at Wall  - 2 x daily - 7 x weekly - 1 sets - 5-10 reps - 5-10 sec  hold - Supine Diaphragmatic Breathing  - 2 x daily - 7 x weekly - 1 sets - 10 reps - 4-6 sec  hold - Shoulder External Rotation and Scapular Retraction with Resistance  - 2 x daily - 7 x weekly - 1 sets - 10 reps - 3-5 sec  hold - Standing Bilateral Low Shoulder Row with Anchored Resistance  - 2 x daily - 7 x weekly - 1-3 sets - 10 reps - 2-3 sec  hold - Shoulder W - External Rotation with Resistance  - 2 x daily - 7 x weekly - 1-2 sets - 10 reps - 3 sec  hold - Drawing Bow  - 1 x daily - 7 x weekly - 1 sets - 10 reps - 3 sec  hold - Seated Cervical Sidebending AROM  - 2 x daily - 7 x weekly - 1 sets - 3-5 reps - 5-10 sec  hold  Patient Education - Office Posture  ASSESSMENT:  CLINICAL IMPRESSION: 11/29/2023: Pt arrives w/ report of continued improvement, no pain on arrival. Today continuing to progress exercise program with emphasis on postural mobility and endurance which she tolerates well. Muscular fatigue but no  increase in resting pain, mild discomfort w/ chin tuck progression but overall tolerates quite well. Given improvement in pain, per discussion w/ pt we trial deferring of manual in favor of increased time with exercise but discussed further use if continues to be helpful with symptom modification. No adverse events, HEP update as above. Recommend continuing along current POC in order to address relevant deficits and improve functional tolerance. Pt departs today's session in no acute distress, all voiced questions/concerns addressed appropriately from PT perspective.      EVAL: Patient is a 58 y.o. female who was seen today for physical therapy evaluation and treatment for chronic L sided neck pain with cervical DDD. She reports symptoms present since mid summer with no known injury. She has done well following cervical fusion 30+ years ago and does not recall any neck pain or problems. 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 Patient presents to clinic with forward posture and alignment; limited and painful cervical mobility/ROM; tightness through the thoracic spine; limited end range shoulder ROM; difficulty with functional activities; pain on a daily basis; intermittent headaches. Patient will benefit form PT to address problems identified.   OBJECTIVE IMPAIRMENTS: decreased activity tolerance, decreased mobility, decreased ROM, decreased strength, increased fascial restrictions, increased muscle spasms, improper body mechanics, postural dysfunction, and pain.    GOALS: Goals reviewed with patient? Yes  SHORT TERM GOALS: Target date: 12/12/2023   Independent in initial HEP  Baseline:  Goal status: INITIAL  2.  Improve cervical ROM by 5-7 degrees in flexion and lateral flexion  Baseline:  Goal status: INITIAL  3.  Patient reports ability to sleep with a pillow  Baseline:  Goal status: INITIAL  LONG TERM GOALS: Target date: 01/09/2024  Decrease pain  in L cervical spine and shoulder by 75-90% Baseline:  Goal status: INITIAL  2.  Pain free cervical ROM WFL's  Baseline:  Goal status: INITIAL  3.  Patient reports and demonstrates modifications in work and home ergonomics to avoid continued irritation of musculoskeletal symptoms  Baseline:  Goal status: INITIAL  4.  Improve posture and alignment with patient to demonstrate more upright posture with activation of posterior shoulder girdle musculature  Baseline:  Goal status: INITIAL  5.  Independent in advanced HEP  Baseline:  Goal status: INITIAL  6.  Improve NDI by 5 points  Baseline: 17/50 Goal status: INITIAL   PLAN:  PT FREQUENCY: 2x/week  PT DURATION: 8 weeks  PLANNED INTERVENTIONS: 97164- PT Re-evaluation, 97110-Therapeutic exercises, 97530- Therapeutic activity, 97112- Neuromuscular re-education, 97535- Self Care, [id number]- Manual therapy, Patient/Family education, Taping, and Joint mobilization  PLAN FOR NEXT SESSION: review and progress exercises; continue with education re ergonomics and body mechanics; manual work and modalities as indicated  - progress with posterior shoulder girdle strengthening.    Alm DELENA Jenny PT, DPT 11/29/2023 5:03 PM

## 2023-12-02 ENCOUNTER — Ambulatory Visit: Admitting: Rehabilitative and Restorative Service Providers"

## 2023-12-07 ENCOUNTER — Ambulatory Visit: Admitting: Rehabilitative and Restorative Service Providers"

## 2023-12-07 ENCOUNTER — Encounter: Payer: Self-pay | Admitting: Rehabilitative and Restorative Service Providers"

## 2023-12-07 DIAGNOSIS — M539 Dorsopathy, unspecified: Secondary | ICD-10-CM | POA: Diagnosis not present

## 2023-12-07 DIAGNOSIS — M6281 Muscle weakness (generalized): Secondary | ICD-10-CM

## 2023-12-07 DIAGNOSIS — R293 Abnormal posture: Secondary | ICD-10-CM

## 2023-12-07 DIAGNOSIS — R29898 Other symptoms and signs involving the musculoskeletal system: Secondary | ICD-10-CM

## 2023-12-07 NOTE — Therapy (Signed)
 OUTPATIENT PHYSICAL THERAPY CERVICAL TREATMENT   Patient Name: Kim Livingston MRN: [id number] DOB:Oct 02, 1965, 58 y.o., female Today's Date: 12/07/2023  END OF SESSION:  PT End of Session - 12/07/23 1448     Visit Number 5    Number of Visits 16    Date for Recertification  01/09/24    Authorization Type c[employer] no deductable; copay $60 - no auth required    Authorization Time Period calendar year    Authorization - Visit Number 4    Authorization - Number of Visits 60    PT Start Time 1445    PT Stop Time 1530    PT Time Calculation (min) 45 min    Activity Tolerance Patient tolerated treatment well           Past Medical History:  Diagnosis Date   Abscess of peritoneum (HCC) 2016   Edema    Fatty liver    GERD (gastroesophageal reflux disease)    hx   Heart murmur    Hypertension    borderline   Hypothyroidism    PONV (postoperative nausea and vomiting)    used scop patch-did well   Wears glasses    Past Surgical History:  Procedure Laterality Date   ABDOMINAL HYSTERECTOMY     Cervix is present.    ADENOIDECTOMY     APPENDECTOMY     BREAST LUMPECTOMY WITH NEEDLE LOCALIZATION Right 05/05/2012   Procedure: BREAST LUMPECTOMY WITH NEEDLE LOCALIZATION;  Surgeon: Elon CHRISTELLA Pacini, MD;  Location: [facility];  Service: General;  Laterality: Right;   cervical back fusion     Upper back.    CESAREAN SECTION     CHOLECYSTECTOMY     TONSILLECTOMY     WISDOM TOOTH EXTRACTION     Patient Active Problem List   Diagnosis Date Noted   IFG (impaired fasting glucose) 07/30/2021   Polyarthralgia 03/11/2020   Pain in both feet 11/16/2018   Fatty liver 08/28/2018   Essential hypertension 03/25/2016   Elevated liver enzymes 08/01/2015   Intraductal papilloma of breast 05/29/2012   Rosacea 10/19/2011   RHINITIS 11/08/2010   Hypothyroid 11/29/2005    PCP: Dr Dorothyann JONETTA Byars  REFERRING PROVIDER: Dr Dorothyann JONETTA Byars  REFERRING DIAG: cervical  radiculopathy   THERAPY DIAG:  Cervical dysfunction  Other symptoms and signs involving the musculoskeletal system  Muscle weakness (generalized)  Abnormal posture  Rationale for Evaluation and Treatment: Rehabilitation  ONSET DATE: 07/31/23  SUBJECTIVE:  SUBJECTIVE STATEMENT: 12/07/2023: continued improvement in symptoms. Noticed increased tightness in the L neck area yesterday from sitting at desk/computer more of the day. Tense day. Managed to decrease pain with heating pad and without meds last night. Feels like her posture is improving. Has been diligent with pool noodle. No resting pain at present, does have tightness when turning head.   Eval: Patient report flare up of chronic L sided neck pain. She has a history of cervical fusion ~ 30 years ago. Current symptoms started 07/31/23 with no known injury. Symptoms have persisted and have not changed.  Hand dominance: Right  PERTINENT HISTORY:  Chronic L sided neck pain; prior cervical fusion C 5/6 ~30 years ago; HTN; hypothyroidism; obesity; bilat foot pain; dermatitis   PAIN:  Are you having pain? Yes: NPRS scale: 0/10 - 3/10 with turning to L  Pain location: L cervical spine; up toward base of skull   Pain description: discomfort; tightness turning to L  Aggravating factors: lying down with a pillow; looking down Relieving factors: biofreeze; heat; massager; advil; supporting neck with thin blanket when lying down   PRECAUTIONS: Cervical prior cervical fusion    WEIGHT BEARING RESTRICTIONS: No  FALLS:  Has patient fallen in last 6 months? 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  DIAGNOSTIC FINDINGS:  Xray 10/27/23: Straightening of the cervical spine. Suboptimal visualization of C7 and cervicothoracic junction. Dens and lateral masses are within normal limits. Multilevel degenerative osteophytes. Moderate disc space narrowing C4-C5 and C5-C6. Interbody fusion at C6-C7. Multilevel facet degenerative changes. Suspect diffuse foraminal narrowing bilaterally C3 through C6.   IMPRESSION: Straightening of the cervical spine with multilevel degenerative changes. Interbody fusion at C6-C7  PATIENT SURVEYS:  NDI:  NECK DISABILITY INDEX  Date: 11/14/23 Score  Pain intensity 2 = The pain is moderate at the moment  2. Personal care (washing, dressing, etc.) 2 = It is painful to look after myself and I am slow and careful  3. Lifting 3 = Pain prevents me from lifting heavy weights but I can manage light to medium   weights if they are conveniently positioned  4. Reading 1 = I can read as much as I want to with slight pain in my neck  5. Headaches 3 = I have moderate headaches, which come frequently  6. Concentration 1 =  I can concentrate fully when I want to with slight difficulty   7. Work 1 =  I can only do my usual work, but no more  8. Driving 1 =  I can drive my car as long as I want with slight pain in my neck  9. Sleeping 2 = My sleep is mildly disturbed (1-2 hrs sleepless)  10. Recreation 1 =  I am able to engage in all my recreation activities, with some pain in my neck  Total 17/50   Minimum Detectable Change (90% confidence): 5 points or 10% points   SENSATION: WFL  POSTURE: Patient presents with head forward posture with increased thoracic  kyphosis;  shoulders rounded and elevated; scapulae abducted and rotated along the thoracic spine; head of the humerus anterior in orientation.   PALPATION: Muscular tightness L and R cervical paraspinals; pecs; upper traps; leveator; biceps; anterior deltoid   CERVICAL ROM:   Active ROM A/PROM (deg) eval AROM 12/07/23   Flexion 48 49  Extension 6 pain L 25  Right lateral flexion 9 pain L 17  Left lateral flexion 12 pain L 17 tight discomfort  Right rotation 40 pain L 57  Left rotation 32 pain L  40 tight discomfort    (Blank rows = not tested)  UPPER EXTREMITY ROM:  Active ROM Right eval Left eval  Shoulder flexion Tight end range Tight end range  Shoulder extension    Shoulder abduction Tight end range Tight end range  Shoulder adduction    Shoulder extension    Shoulder internal rotation Tight end range Tight end range  Shoulder external rotation    Elbow flexion    Elbow extension    Wrist flexion    Wrist extension    Wrist ulnar deviation    Wrist radial deviation    Wrist pronation    Wrist supination     (Blank rows = not tested)  UPPER EXTREMITY MMT: strength bilat UE's WFL - poor strength in posterior shoulder girdle - middle and lower traps  MMT Right eval Left eval  Shoulder flexion    Shoulder extension    Shoulder abduction    Shoulder adduction    Shoulder extension    Shoulder internal rotation    Shoulder external rotation    Middle trapezius    Lower trapezius    Elbow flexion    Elbow extension    Wrist flexion    Wrist extension    Wrist ulnar deviation    Wrist radial deviation    Wrist pronation    Wrist supination    Grip strength     (Blank rows = not tested)  CERVICAL SPECIAL TESTS: cervical compression/distraction negative    [facility] Adult PT Treatment:                                                DATE: 12/07/23 Therapeutic Exercise: Doorway stretch 3 positions 30 sec x 2  Lateral cervical flexion 3 sec x 3 R/L  Manual  Therapy: STM cervical and thoracic spine patient supine  Release work through the clavicle and 1st rib  Manual cervical traction 10-15 sec hold x 4  Myofacial release pecs/anterior chest; L lateral cervical area  Neuromuscular re-ed: Chin tuck with noodle 10 sec x 10 Scap squeeze with noodle 10 sec x 10  Therapeutic Activity: (review for HEP) Scap squeeze with ER yellow TB with noodle 3 sec x 10  W with noodle yellow TB x 10  Backward shoulder rolls  Thoracic extension with coregeous ball 5-10 sec x 10  Row blue TB 3 sec x 10  Bow and arrow 3 sec x 10 R/L  Self Care:  Office ergonomics   Sleeping positions and use of hand towel to support  c-spine  Modaities:   Moist heat cervical spine x 10 min    [facility] Adult PT Treatment:                                                DATE: 11/29/23 Therapeutic Exercise: YB W w/ pool noodle x12, RB x10 Standing BIL scaption w/ pool noodle at wall x8;  Standing blue band row x10 Standing red band shoulder ext x8  Chin tuck w/ ball at wall x12 HEP update + education/discussion  Neuromuscular re-ed: RB bow and arrow x10 cues for reduced UT/elbow compensations  Standing BIL scaption YB at wrist <90 deg 2x10 Modified counter plank shoulder tap x8 BIL cues for core engagement     [facility] Adult PT Treatment:                                                DATE: 11/24/23 Therapeutic Exercise: Doorway stretch 3 positions 30 sec x 2  Lateral cervical flexion 3 sec x 3 R/L  Manual Therapy: STM cervical and thoracic spine patient supine  Release work through the clavicle and 1st rib  Manual cervical traction 10-15 sec hold x 4  Neuromuscular re-ed: Chin tuck with noodle 10 sec x 10 Scap squeeze with noodle 10 sec x 10  Therapeutic Activity: Scap squeeze with ER yellow TB with noodle 3 sec x 10  W with noodle yellow TB x 10   Backward shoulder rolls  Thoracic extension with coregeous ball 5-10 sec x 10  Row blue TB 3 sec x 10  Bow and arrow 3 sec x 10 R/L  Self Care:  Office ergonomics   Sleeping positions and use of hand towel to support  c-spine                                                                                                                         Modaities:   Moist heat cervical spine x 10 min    PATIENT EDUCATION:  Education details: POC; HEP  Person educated: Patient Education method: Programmer, multimedia, Facilities manager, Actor cues, Verbal cues, and Handouts Education comprehension: verbalized understanding, returned demonstration, verbal cues required, tactile cues required, and needs further education  HOME EXERCISE PROGRAM: Access Code: [email or web address] URL: [email or web address] Date: 11/24/2023 Prepared by: Benjamim Harnish  Exercises - Seated Cervical Retraction  - 2 x daily - 7 x weekly - 1-2 sets - 5-10 reps - 10 sec  hold - Supine Cervical Retraction with Towel  - 2 x daily - 7 x weekly - 1 sets - 5-10 reps - 10 sec  hold - Seated Scapular Retraction  - 2 x daily - 7 x weekly - 1-2 sets - 10 reps - 10 sec  hold - Shoulder External Rotation and Scapular Retraction  - 1-2 x daily - 7 x weekly - 1 sets - 10 reps - 3-5 sec   hold - Standing Shoulder W at Wall  - 1-2 x daily - 7 x weekly - 1 sets - 10 reps - 3 sec  hold - Doorway Pec Stretch at 60 Degrees Abduction  - 3 x daily - 7 x weekly - 1 sets - 3 reps - Doorway Pec Stretch at 90 Degrees Abduction  - 3 x daily - 7 x weekly - 1 sets - 3 reps - 30 seconds  hold - Doorway Pec Stretch at 120 Degrees Abduction  - 3 x daily - 7 x weekly - 1 sets - 3 reps - 30 second hold  hold - Standing Anatomical Position with Scapular Retraction and Depression at Wall  - 2 x daily - 7 x weekly - 1 sets - 5-10 reps - 5-10 sec  hold - Supine Diaphragmatic Breathing  - 2 x daily - 7 x weekly - 1 sets - 10 reps - 4-6 sec  hold - Shoulder External  Rotation and Scapular Retraction with Resistance  - 2 x daily - 7 x weekly - 1 sets - 10 reps - 3-5 sec  hold - Standing Bilateral Low Shoulder Row with Anchored Resistance  - 2 x daily - 7 x weekly - 1-3 sets - 10 reps - 2-3 sec  hold - Shoulder W - External Rotation with Resistance  - 2 x daily - 7 x weekly - 1-2 sets - 10 reps - 3 sec  hold - Drawing Bow  - 1 x daily - 7 x weekly - 1 sets - 10 reps - 3 sec  hold - Seated Cervical Sidebending AROM  - 2 x daily - 7 x weekly - 1 sets - 3-5 reps - 5-10 sec  hold  Patient Education - Office Posture  ASSESSMENT:  CLINICAL IMPRESSION: 12/07/2023: denise demonstrates good gains in cervical ROM with significant improvement in pain with AROM. Patient reports improved ability to turn head to check traffic and move head and neck.  Treatment consisted of neuromuscular reeducation; stretching and manual work. Working on TPR; myofacial release and gentle stretching. Good response to treatment. Patient progressing well toward stated goals of treatment.        EVAL: Patient is a 58 y.o. female who was seen today for physical therapy evaluation and treatment for chronic L sided neck pain with cervical DDD. She reports symptoms present since mid summer with no known injury. She has done well following cervical fusion 30+ years ago and does not recall any neck pain or problems. 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 Patient presents to clinic with forward posture and alignment; limited and painful cervical mobility/ROM; tightness through the thoracic spine; limited end range shoulder ROM; difficulty with functional activities; pain on a daily basis; intermittent headaches. Patient will benefit form PT to address problems identified.   OBJECTIVE IMPAIRMENTS: decreased activity tolerance, decreased mobility, decreased ROM, decreased strength, increased fascial restrictions, increased muscle spasms, improper body mechanics,  postural dysfunction, and pain.    GOALS: Goals reviewed with patient? Yes  SHORT TERM GOALS: Target date: 12/12/2023   Independent in initial HEP  Baseline:  Goal status: INITIAL  2.  Improve cervical ROM by 5-7 degrees in flexion and lateral flexion  Baseline:  Goal status: INITIAL  3.  Patient reports ability  to sleep with a pillow  Baseline:  Goal status: INITIAL  LONG TERM GOALS: Target date: 01/09/2024  Decrease pain in L cervical spine and shoulder by 75-90% Baseline:  Goal status: INITIAL  2.  Pain free cervical ROM WFL's  Baseline:  Goal status: INITIAL  3.  Patient reports and demonstrates modifications in work and home ergonomics to avoid continued irritation of musculoskeletal symptoms  Baseline:  Goal status: INITIAL  4.  Improve posture and alignment with patient to demonstrate more upright posture with activation of posterior shoulder girdle musculature  Baseline:  Goal status: INITIAL  5.  Independent in advanced HEP  Baseline:  Goal status: INITIAL  6.  Improve NDI by 5 points  Baseline: 17/50 Goal status: INITIAL   PLAN:  PT FREQUENCY: 2x/week  PT DURATION: 8 weeks  PLANNED INTERVENTIONS: 97164- PT Re-evaluation, 97110-Therapeutic exercises, 97530- Therapeutic activity, 97112- Neuromuscular re-education, 97535- Self Care, [id number]- Manual therapy, Patient/Family education, Taping, and Joint mobilization  PLAN FOR NEXT SESSION: review and progress exercises; continue with education re ergonomics and body mechanics; manual work and modalities as indicated  - progress with posterior shoulder girdle strengthening.    Alm DELENA Jenny PT, DPT 12/07/2023 2:48 PM

## 2023-12-09 ENCOUNTER — Encounter

## 2024-01-09 ENCOUNTER — Encounter: Payer: Self-pay | Admitting: Rehabilitative and Restorative Service Providers"

## 2024-01-09 ENCOUNTER — Ambulatory Visit: Attending: Family Medicine | Admitting: Rehabilitative and Restorative Service Providers"

## 2024-01-09 DIAGNOSIS — R29898 Other symptoms and signs involving the musculoskeletal system: Secondary | ICD-10-CM | POA: Insufficient documentation

## 2024-01-09 DIAGNOSIS — R293 Abnormal posture: Secondary | ICD-10-CM | POA: Diagnosis present

## 2024-01-09 DIAGNOSIS — M539 Dorsopathy, unspecified: Secondary | ICD-10-CM | POA: Diagnosis present

## 2024-01-09 DIAGNOSIS — M6281 Muscle weakness (generalized): Secondary | ICD-10-CM | POA: Diagnosis present

## 2024-01-09 NOTE — Therapy (Signed)
 OUTPATIENT PHYSICAL THERAPY CERVICAL TREATMENT   Patient Name: Kim Livingston MRN: [id number] DOB:03/10/1965, 58 y.o., female Today's Date: 01/09/2024  END OF SESSION:  PT End of Session - 01/09/24 1527     Visit Number 6    Number of Visits 16    Date for Recertification  01/09/24    Authorization Type [employer] no deductable; copay $60 - no auth required    Authorization Time Period calendar year    Authorization - Visit Number 6    Authorization - Number of Visits 60    PT Start Time 1527    PT Stop Time 1618    PT Time Calculation (min) 51 min    Activity Tolerance Patient tolerated treatment well           Past Medical History:  Diagnosis Date   Abscess of peritoneum (HCC) 2016   Edema    Fatty liver    GERD (gastroesophageal reflux disease)    hx   Heart murmur    Hypertension    borderline   Hypothyroidism    PONV (postoperative nausea and vomiting)    used scop patch-did well   Wears glasses    Past Surgical History:  Procedure Laterality Date   ABDOMINAL HYSTERECTOMY     Cervix is present.    ADENOIDECTOMY     APPENDECTOMY     BREAST LUMPECTOMY WITH NEEDLE LOCALIZATION Right 05/05/2012   Procedure: BREAST LUMPECTOMY WITH NEEDLE LOCALIZATION;  Surgeon: Elon CHRISTELLA Pacini, MD;  Location: [facility];  Service: General;  Laterality: Right;   cervical back fusion     Upper back.    CESAREAN SECTION     CHOLECYSTECTOMY     TONSILLECTOMY     WISDOM TOOTH EXTRACTION     Patient Active Problem List   Diagnosis Date Noted   IFG (impaired fasting glucose) 07/30/2021   Polyarthralgia 03/11/2020   Pain in both feet 11/16/2018   Fatty liver 08/28/2018   Essential hypertension 03/25/2016   Elevated liver enzymes 08/01/2015   Intraductal papilloma of breast 05/29/2012   Rosacea 10/19/2011   RHINITIS 11/08/2010   Hypothyroid 11/29/2005    PCP: Dr Dorothyann JONETTA Byars  REFERRING PROVIDER: Dr Dorothyann JONETTA Byars  REFERRING DIAG: cervical  radiculopathy   THERAPY DIAG:  Cervical dysfunction  Other symptoms and signs involving the musculoskeletal system  Muscle weakness (generalized)  Abnormal posture  Rationale for Evaluation and Treatment: Rehabilitation  ONSET DATE: 07/31/23  SUBJECTIVE:  SUBJECTIVE STATEMENT: 01/09/2024: patient reports that she was doing well. She was on vacation and did well but came back to work with increased stress and things have tightened up again. She has continued with her HEP and has the noodle at her desk which helps with posture and acts as a reminder. Noticed increased tightness in the L neck area as well as frequent headaches again in the past few weeks with symptoms increasing gradually. Managed to decrease pain with heating pad. Feels like her posture is improving. Has been diligent with pool noodle. Some pain at rest again. Patient reports persistent difficulty turning head to L but does note some improvement since beginning therapy. She has some improvement in ability to turn head to check traffic and move head and neck.    Eval: Patient report flare up of chronic L sided neck pain. She has a history of cervical fusion ~ 30 years ago. Current symptoms started 07/31/23 with no known injury. Symptoms have persisted and have not changed.  Hand dominance: Right  PERTINENT HISTORY:  Chronic L sided neck pain; prior cervical fusion C 5/6 ~30 years ago; HTN; hypothyroidism; obesity; bilat foot pain; dermatitis   PAIN:  Are you having pain? Yes: NPRS scale: 4/10   Pain location: L cervical spine; up toward base of skull   Pain description: discomfort; tightness turning to L  Aggravating factors: lying down with a pillow; looking down slightly; turning L is the worst  Relieving factors: biofreeze;  heat; massager; advil; supporting neck with thin blanket when lying down   PRECAUTIONS: Cervical prior cervical fusion    WEIGHT BEARING RESTRICTIONS: No  FALLS:  Has patient fallen in last 6 months? 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  DIAGNOSTIC FINDINGS:  Xray 10/27/23: Straightening of the cervical spine. Suboptimal visualization of C7 and cervicothoracic junction. Dens and lateral masses are within normal limits. Multilevel degenerative osteophytes. Moderate disc space narrowing C4-C5 and C5-C6. Interbody fusion at C6-C7. Multilevel facet degenerative changes. Suspect diffuse foraminal narrowing bilaterally C3 through C6.   IMPRESSION: Straightening of the cervical spine with multilevel degenerative changes. Interbody fusion at C6-C7  PATIENT SURVEYS:  NDI:  NECK DISABILITY INDEX  Date: 11/14/23 Score  Pain intensity 2 = The pain is moderate at the moment  2. Personal care (washing, dressing, etc.) 2 = It is painful to look after myself and I am slow and careful  3. Lifting 3 = Pain prevents me from lifting heavy weights but I can manage light to medium   weights if they are conveniently positioned  4. Reading 1 = I can read as much as I want to with slight pain in my neck  5. Headaches 3 = I have moderate headaches, which come frequently  6. Concentration 1 =  I can concentrate fully when I want to with slight difficulty   7. Work 1 =  I can only do my usual work, but no more  8. Driving 1 =  I can drive my car as  long  as I want with slight pain in my neck  9. Sleeping 2 = My sleep is mildly disturbed (1-2 hrs sleepless)  10. Recreation 1 =  I am able to engage in all my recreation activities, with some pain in my neck  Total 17/50   Minimum Detectable Change (90% confidence): 5 points or 10% points  01/09/24: 20/50; 40% limitation    SENSATION: WFL  POSTURE: Patient presents with head forward posture with increased thoracic kyphosis; shoulders rounded and elevated; scapulae abducted and rotated along the thoracic spine; head of the humerus anterior in orientation.   PALPATION: Muscular tightness L and R cervical paraspinals; pecs; upper traps; leveator; biceps; anterior deltoid   CERVICAL ROM:   Active ROM A/PROM (deg) eval AROM 12/07/23  AROM  01/09/24  Flexion 48 49 46  Extension 6 pain L 25 31  Right lateral flexion 9 pain L 17 12 discomfort L   Left lateral flexion 12 pain L 17 tight discomfort 15 discomfort L   Right rotation 40 pain L 57 56  Left rotation 32 pain L  40 tight discomfort  36 discomfort L    (Blank rows = not tested)  UPPER EXTREMITY ROM:   01/09/24: persistent end range tightness bilat shoulders   Active ROM Right eval Left eval  Shoulder flexion Tight end range Tight end range  Shoulder extension    Shoulder abduction Tight end range Tight end range  Shoulder adduction    Shoulder extension    Shoulder internal rotation Tight end range Tight end range  Shoulder external rotation    Elbow flexion    Elbow extension    Wrist flexion    Wrist extension    Wrist ulnar deviation    Wrist radial deviation    Wrist pronation    Wrist supination     (Blank rows = not tested)  UPPER EXTREMITY MMT: strength bilat UE's WFL - poor strength in posterior shoulder girdle - middle and lower traps   01/09/24: decreased posterior shoulder girdle strength middle and lower traps   MMT Right eval Left eval  Shoulder flexion    Shoulder extension    Shoulder  abduction    Shoulder adduction    Shoulder extension    Shoulder internal rotation    Shoulder external rotation    Middle trapezius    Lower trapezius    Elbow flexion    Elbow extension    Wrist flexion    Wrist extension    Wrist ulnar deviation    Wrist radial deviation    Wrist pronation    Wrist supination    Grip strength     (Blank rows = not tested)  CERVICAL SPECIAL TESTS: cervical compression/distraction negative    [facility] Adult PT Treatment:                                                DATE: 01/09/24 Therapeutic Exercise: Doorway stretch 3 positions 30 sec x 2  Lateral cervical flexion 3 sec x 3 R/L  Manual Therapy: STM cervical and thoracic spine patient supine  Release work through the clavicle and 1st rib  Manual cervical traction 10-15 sec hold x 4  Myofacial release pecs/anterior chest; L lateral cervical area  Gentle cervical stretch laterally to tolerance - note limited mobility  Neuromuscular re-ed: Chin tuck with noodle 10  sec x 10 Self Care:  Office ergonomics   Sleeping positions and use of hand towel to support  c-spine                                                                                                                         Modaities:   Moist heat cervical spine x 10 min    [facility] Adult PT Treatment:                                                DATE: 12/07/23 Therapeutic Exercise: Doorway stretch 3 positions 30 sec x 2  Lateral cervical flexion 3 sec x 3 R/L  Manual Therapy: STM cervical and thoracic spine patient supine  Release work through the clavicle and 1st rib  Manual cervical traction 10-15 sec hold x 4  Myofacial release pecs/anterior chest; L lateral cervical area  Neuromuscular re-ed: Chin tuck with noodle 10 sec x 10 Scap squeeze with noodle 10 sec x 10  Therapeutic Activity: (review for HEP) Scap squeeze with ER yellow TB with noodle 3 sec x 10  W with noodle yellow TB x 10  Backward shoulder rolls  Thoracic  extension with coregeous ball 5-10 sec x 10  Row blue TB 3 sec x 10  Bow and arrow 3 sec x 10 R/L  Self Care:  Office ergonomics   Sleeping positions and use of hand towel to support  c-spine                                                                                                                         Modaities:   Moist heat cervical spine x 10 min    [facility] Adult PT Treatment:                                                DATE: 11/29/23 Therapeutic Exercise: YB W w/ pool noodle x12, RB x10 Standing BIL scaption w/ pool noodle at wall x8;  Standing blue band row x10 Standing red band shoulder ext x8  Chin tuck w/ ball at wall x12 HEP update + education/discussion  Neuromuscular re-ed: RB bow and arrow x10 cues for reduced UT/elbow  compensations  Standing BIL scaption YB at wrist <90 deg 2x10 Modified counter plank shoulder tap x8 BIL cues for core engagement     [facility] Adult PT Treatment:                                                DATE: 11/24/23 Therapeutic Exercise: Doorway stretch 3 positions 30 sec x 2  Lateral cervical flexion 3 sec x 3 R/L  Manual Therapy: STM cervical and thoracic spine patient supine  Release work through the clavicle and 1st rib  Manual cervical traction 10-15 sec hold x 4  Neuromuscular re-ed: Chin tuck with noodle 10 sec x 10 Scap squeeze with noodle 10 sec x 10  Therapeutic Activity: Scap squeeze with ER yellow TB with noodle 3 sec x 10  W with noodle yellow TB x 10  Backward shoulder rolls  Thoracic extension with coregeous ball 5-10 sec x 10  Row blue TB 3 sec x 10  Bow and arrow 3 sec x 10 R/L  Self Care:  Office ergonomics   Sleeping positions and use of hand towel to support  c-spine                                                                                                                         Modaities:   Moist heat cervical spine x 10 min    PATIENT EDUCATION:  Education details: POC; HEP  Person educated:  Patient Education method: Programmer, Multimedia, Facilities Manager, Actor cues, Verbal cues, and Handouts Education comprehension: verbalized understanding, returned demonstration, verbal cues required, tactile cues required, and needs further education  HOME EXERCISE PROGRAM: Access Code: [email or web address] URL: [email or web address] Date: 11/24/2023 Prepared by: Shonya Sumida  Exercises - Seated Cervical Retraction  - 2 x daily - 7 x weekly - 1-2 sets - 5-10 reps - 10 sec  hold - Supine Cervical Retraction with Towel  - 2 x daily - 7 x weekly - 1 sets - 5-10 reps - 10 sec  hold - Seated Scapular Retraction  - 2 x daily - 7 x weekly - 1-2 sets - 10 reps - 10 sec  hold - Shoulder External Rotation and Scapular Retraction  - 1-2 x daily - 7 x weekly - 1 sets - 10 reps - 3-5 sec   hold - Standing Shoulder W at Wall  - 1-2 x daily - 7 x weekly - 1 sets - 10 reps - 3 sec  hold - Doorway Pec Stretch at 60 Degrees Abduction  - 3 x daily - 7 x weekly - 1 sets - 3 reps - Doorway Pec Stretch at 90 Degrees Abduction  - 3 x daily - 7 x weekly - 1 sets - 3 reps - 30 seconds  hold - Doorway Pec Stretch at 120 Degrees Abduction  - 3 x daily - 7  x weekly - 1 sets - 3 reps - 30 second hold  hold - Standing Anatomical Position with Scapular Retraction and Depression at Wall  - 2 x daily - 7 x weekly - 1 sets - 5-10 reps - 5-10 sec  hold - Supine Diaphragmatic Breathing  - 2 x daily - 7 x weekly - 1 sets - 10 reps - 4-6 sec  hold - Shoulder External Rotation and Scapular Retraction with Resistance  - 2 x daily - 7 x weekly - 1 sets - 10 reps - 3-5 sec  hold - Standing Bilateral Low Shoulder Row with Anchored Resistance  - 2 x daily - 7 x weekly - 1-3 sets - 10 reps - 2-3 sec  hold - Shoulder W - External Rotation with Resistance  - 2 x daily - 7 x weekly - 1-2 sets - 10 reps - 3 sec  hold - Drawing Bow  - 1 x daily - 7 x weekly - 1 sets - 10 reps - 3 sec  hold - Seated Cervical Sidebending AROM  - 2 x daily - 7 x weekly  - 1 sets - 3-5 reps - 5-10 sec  hold  Patient Education - Office Posture  ASSESSMENT:  CLINICAL IMPRESSION: 01/09/2024: Kim Livingston returns with increased pain and return of headaches in the past 2-3 weeks with increased stress and demands at work. She has spent more time at work and more time at the computer at work. She demonstrates improvement in some cervical ROM compared to last visit. She has increased palpable tightness in the L > R cervical musculature in posterior, lateral and anterior cervical musculature as well as pecs/upper traps/leveator. She has no pain with AROM but she has continued discomfort with some ranges as noted above. Treatment will include neuromuscular reeducation; stretching and manual work (TPR; myofacial release and gentle stretching); progressing with posterior shoulder girdle strengthening. Good response to treatment. Patient will benefit from continued physical therapy to address problems identified. Goals remain appropriate.        EVAL: Patient is a 58 y.o. female who was seen today for physical therapy evaluation and treatment for chronic L sided neck pain with cervical DDD. She reports symptoms present since mid summer with no known injury. She has done well following cervical fusion 30+ years ago and does not recall any neck pain or problems. 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 Patient presents to clinic with forward posture and alignment; limited and painful cervical mobility/ROM; tightness through the thoracic spine; limited end range shoulder ROM; difficulty with functional activities; pain on a daily basis; intermittent headaches. Patient will benefit form PT to address problems identified.   OBJECTIVE IMPAIRMENTS: decreased activity tolerance, decreased mobility, decreased ROM, decreased strength, increased fascial restrictions, increased muscle spasms, improper body mechanics, postural dysfunction, and pain.     GOALS: Goals reviewed with patient? Yes  SHORT TERM GOALS: Target date: 02/06/2024   Independent in initial HEP  Baseline:  Goal status: met  2.  Improve cervical ROM by 5-7 degrees in flexion and lateral flexion  Baseline:  Goal status: partially met   3.  Patient reports ability to sleep with a pillow  Baseline: thin pillow  Goal status: met  LONG TERM GOALS: Target date: 03/05/2024   Decrease pain in L cervical spine and shoulder by 75-90% Baseline:  Goal status: on going   2.  Pain free cervical ROM WFL's  Baseline:  Goal  status: on going  3.  Patient reports and demonstrates modifications in work and home ergonomics to avoid continued irritation of musculoskeletal symptoms  Baseline:  Goal status: on going  4.  Improve posture and alignment with patient to demonstrate more upright posture with activation of posterior shoulder girdle musculature  Baseline:  Goal status: on going   5.  Independent in advanced HEP  Baseline:  Goal status: on going   6.  Improve NDI by 5 points  Baseline: 17/50 01/09/24: 20/50; 40%  Goal status: on going    PLAN:  PT FREQUENCY: 2x/week  PT DURATION: 8 weeks  PLANNED INTERVENTIONS: 97164- PT Re-evaluation, 97110-Therapeutic exercises, 97530- Therapeutic activity, 97112- Neuromuscular re-education, 97535- Self Care, [id number]- Manual therapy, Patient/Family education, Taping, and Joint mobilization  PLAN FOR NEXT SESSION: review and progress exercises; continue with education re ergonomics and body mechanics; manual work and modalities as indicated  - progress with posterior shoulder girdle strengthening as tolerated    Palestine Mosco P. Ina PT, MPH 01/09/24 4:28 PM

## 2024-01-11 ENCOUNTER — Ambulatory Visit: Admitting: Rehabilitative and Restorative Service Providers"

## 2024-01-25 ENCOUNTER — Ambulatory Visit: Payer: Self-pay | Attending: Family Medicine | Admitting: Rehabilitative and Restorative Service Providers"

## 2024-02-01 ENCOUNTER — Encounter: Payer: Self-pay | Admitting: Rehabilitative and Restorative Service Providers"

## 2024-02-01 ENCOUNTER — Ambulatory Visit: Payer: Self-pay | Attending: Family Medicine | Admitting: Rehabilitative and Restorative Service Providers"

## 2024-02-01 DIAGNOSIS — M539 Dorsopathy, unspecified: Secondary | ICD-10-CM | POA: Insufficient documentation

## 2024-02-01 DIAGNOSIS — R29898 Other symptoms and signs involving the musculoskeletal system: Secondary | ICD-10-CM | POA: Diagnosis present

## 2024-02-01 DIAGNOSIS — M6281 Muscle weakness (generalized): Secondary | ICD-10-CM | POA: Diagnosis present

## 2024-02-01 DIAGNOSIS — R293 Abnormal posture: Secondary | ICD-10-CM | POA: Diagnosis present

## 2024-02-01 NOTE — Therapy (Signed)
 OUTPATIENT PHYSICAL THERAPY CERVICAL TREATMENT   Patient Name: Kim Livingston MRN: [id number] DOB:12-14-1965, 58 y.o., female Today's Date: 02/01/2024  END OF SESSION:  PT End of Session - 02/01/24 0713     Visit Number 7    Number of Visits 16    Date for Recertification  03/28/24    Authorization Type c[employer] no deductable; copay $60 - no auth required    Authorization Time Period calendar year    Authorization - Visit Number 7    Authorization - Number of Visits 60    PT Start Time 0712    PT Stop Time 0803    PT Time Calculation (min) 51 min    Activity Tolerance Patient tolerated treatment well           Past Medical History:  Diagnosis Date   Abscess of peritoneum (HCC) 2016   Edema    Fatty liver    GERD (gastroesophageal reflux disease)    hx   Heart murmur    Hypertension    borderline   Hypothyroidism    PONV (postoperative nausea and vomiting)    used scop patch-did well   Wears glasses    Past Surgical History:  Procedure Laterality Date   ABDOMINAL HYSTERECTOMY     Cervix is present.    ADENOIDECTOMY     APPENDECTOMY     BREAST LUMPECTOMY WITH NEEDLE LOCALIZATION Right 05/05/2012   Procedure: BREAST LUMPECTOMY WITH NEEDLE LOCALIZATION;  Surgeon: Elon CHRISTELLA Pacini, MD;  Location: [facility];  Service: General;  Laterality: Right;   cervical back fusion     Upper back.    CESAREAN SECTION     CHOLECYSTECTOMY     TONSILLECTOMY     WISDOM TOOTH EXTRACTION     Patient Active Problem List   Diagnosis Date Noted   IFG (impaired fasting glucose) 07/30/2021   Polyarthralgia 03/11/2020   Pain in both feet 11/16/2018   Fatty liver 08/28/2018   Essential hypertension 03/25/2016   Elevated liver enzymes 08/01/2015   Intraductal papilloma of breast 05/29/2012   Rosacea 10/19/2011   RHINITIS 11/08/2010   Hypothyroid 11/29/2005    PCP: Dr Dorothyann JONETTA Byars  REFERRING PROVIDER: Dr Dorothyann JONETTA Byars  REFERRING DIAG: cervical  radiculopathy   THERAPY DIAG:  Cervical dysfunction  Other symptoms and signs involving the musculoskeletal system  Muscle weakness (generalized)  Abnormal posture  Rationale for Evaluation and Treatment: Rehabilitation  ONSET DATE: 07/31/23  SUBJECTIVE:  SUBJECTIVE STATEMENT: 02/01/2024: patient reports that she has felt better since last visit. She is continuing with her HEP. was doing well. She was on vacation and did well but came back to work with increased stress and things have tightened up again. She has continued with her HEP and has the noodle at her desk which helps with posture and acts as a reminder. Notices improved mobility and less tightness today.   Eval: Patient report flare up of chronic L sided neck pain. She has a history of cervical fusion ~ 30 years ago. Current symptoms started 07/31/23 with no known injury. Symptoms have persisted and have not changed.  Hand dominance: Right  PERTINENT HISTORY:  Chronic L sided neck pain; prior cervical fusion C 5/6 ~30 years ago; HTN; hypothyroidism; obesity; bilat foot pain; dermatitis   PAIN:  Are you having pain? Yes: NPRS scale: 2/10   Pain location: L cervical spine; up toward base of skull   Pain description: discomfort; tightness turning to L  Aggravating factors: lying down with a pillow; looking down slightly; turning L is the worst  Relieving factors: biofreeze; heat; massager; advil; supporting neck with thin blanket when lying down   PRECAUTIONS: Cervical prior cervical fusion    WEIGHT BEARING RESTRICTIONS: No  FALLS:  Has patient fallen in last 6 months? 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  DIAGNOSTIC FINDINGS:  Xray 10/27/23: Straightening of the cervical spine. Suboptimal visualization of C7 and cervicothoracic junction. Dens and lateral masses are within normal limits. Multilevel degenerative osteophytes. Moderate disc space narrowing C4-C5 and C5-C6. Interbody fusion at C6-C7. Multilevel facet degenerative changes. Suspect diffuse foraminal narrowing bilaterally C3 through C6.   IMPRESSION: Straightening of the cervical spine with multilevel degenerative changes. Interbody fusion at C6-C7  PATIENT SURVEYS:  NDI:  NECK DISABILITY INDEX  Date: 11/14/23 Score  Pain intensity 2 = The pain is moderate at the moment  2. Personal care (washing, dressing, etc.) 2 = It is painful to look after myself and I am slow and careful  3. Lifting 3 = Pain prevents me from lifting heavy weights but I can manage light to medium   weights if they are conveniently positioned  4. Reading 1 = I can read as much as I want to with slight pain in my neck  5. Headaches 3 = I have moderate headaches, which come frequently  6. Concentration 1 =  I can concentrate fully when I want to with slight difficulty   7. Work 1 =  I can only do my usual work, but no more  8. Driving 1 =  I can drive my car as long as I want with slight pain in my neck  9. Sleeping 2 = My sleep is mildly disturbed (1-2 hrs sleepless)  10. Recreation 1 =  I am able to engage in all my recreation activities, with some pain in my neck  Total 17/50   Minimum Detectable Change (90% confidence): 5 points or 10% points  01/09/24: 20/50; 40%  limitation     SENSATION: WFL  POSTURE: Patient presents with head forward posture with increased thoracic kyphosis; shoulders rounded and elevated; scapulae abducted and rotated along the thoracic spine; head of the humerus anterior in orientation.   PALPATION: Muscular tightness L and R cervical paraspinals; pecs; upper traps; leveator; biceps; anterior deltoid; SCM 02/01/24: persistent tightness L > R cervical musculature as noted    CERVICAL ROM:   Active ROM A/PROM (deg) eval AROM 12/07/23  AROM  01/09/24 AROM 02/01/24  Flexion 48 49 46 49  Extension 6 pain L 25 31 31   Right lateral flexion 9 pain L 17 12 discomfort L  19 mild discomfort   Left lateral flexion 12 pain L 17 tight discomfort 15 discomfort L  17 mild discomfort   Right rotation 40 pain L 57 56 56  Left rotation 32 pain L  40 tight discomfort  36 discomfort L  35 mild discomfort    (Blank rows = not tested)  UPPER EXTREMITY ROM:   01/09/24: persistent end range tightness bilat shoulders   Active ROM Right eval Left eval  Shoulder flexion Tight end range Tight end range  Shoulder extension    Shoulder abduction Tight end range Tight end range  Shoulder adduction    Shoulder extension    Shoulder internal rotation Tight end range Tight end range  Shoulder external rotation    Elbow flexion    Elbow extension    Wrist flexion    Wrist extension    Wrist ulnar deviation    Wrist radial deviation    Wrist pronation    Wrist supination     (Blank rows = not tested)  UPPER EXTREMITY MMT: strength bilat UE's WFL - poor strength in posterior shoulder girdle - middle and lower traps   01/09/24: decreased posterior shoulder girdle strength middle and lower traps   MMT Right eval Left eval  Shoulder flexion    Shoulder extension    Shoulder abduction    Shoulder adduction    Shoulder extension    Shoulder internal rotation    Shoulder external rotation    Middle trapezius    Lower trapezius    Elbow flexion     Elbow extension    Wrist flexion    Wrist extension    Wrist ulnar deviation    Wrist radial deviation    Wrist pronation    Wrist supination    Grip strength     (Blank rows = not tested)  CERVICAL SPECIAL TESTS: cervical compression/distraction negative    [facility] Adult PT Treatment:                                                DATE: 02/01/24 Therapeutic Exercise: Doorway stretch 3 positions 30 sec x 2  Lateral cervical flexion 3 sec x 3 R/L  Manual Therapy: STM cervical and thoracic spine patient supine  Release work through the clavicle and 1st rib  Manual cervical traction 10-15 sec hold x 4  Myofacial release pecs/anterior chest; L lateral cervical area  Gentle cervical stretch laterally to tolerance - note limited mobility  Therapeutic activity: Open book sidelying R/L 5 sec x 10  Neuromuscular re-ed: Chin tuck with noodle 10 sec x 10 Self Care:  Office ergonomics   Sleeping positions and use of hand towel to support  c-spine  Modaities:   Moist heat cervical spine x 10 min    [facility] Adult PT Treatment:                                                DATE: 01/09/24 Therapeutic Exercise: Doorway stretch 3 positions 30 sec x 2  Lateral cervical flexion 3 sec x 3 R/L  Manual Therapy: STM cervical and thoracic spine patient supine  Release work through the clavicle and 1st rib  Manual cervical traction 10-15 sec hold x 4  Myofacial release pecs/anterior chest; L lateral cervical area  Gentle cervical stretch laterally to tolerance - note limited mobility  Neuromuscular re-ed: Chin tuck with noodle 10 sec x 10 Self Care:  Office ergonomics   Sleeping positions and use of hand towel to support  c-spine                                                                                                                         Modaities:   Moist heat  cervical spine x 10 min    [facility] Adult PT Treatment:                                                DATE: 12/07/23 Therapeutic Exercise: Doorway stretch 3 positions 30 sec x 2  Lateral cervical flexion 3 sec x 3 R/L  Manual Therapy: STM cervical and thoracic spine patient supine  Release work through the clavicle and 1st rib  Manual cervical traction 10-15 sec hold x 4  Myofacial release pecs/anterior chest; L lateral cervical area  Neuromuscular re-ed: Chin tuck with noodle 10 sec x 10 Scap squeeze with noodle 10 sec x 10  Therapeutic Activity: (review for HEP) Scap squeeze with ER yellow TB with noodle 3 sec x 10  W with noodle yellow TB x 10  Backward shoulder rolls  Thoracic extension with coregeous ball 5-10 sec x 10  Row blue TB 3 sec x 10  Bow and arrow 3 sec x 10 R/L  Self Care:  Office ergonomics   Sleeping positions and use of hand towel to support  c-spine  Modaities:   Moist heat cervical spine x 10 min    [facility] Adult PT Treatment:                                                DATE: 11/29/23 Therapeutic Exercise: YB W w/ pool noodle x12, RB x10 Standing BIL scaption w/ pool noodle at wall x8;  Standing blue band row x10 Standing red band shoulder ext x8  Chin tuck w/ ball at wall x12 HEP update + education/discussion  Neuromuscular re-ed: RB bow and arrow x10 cues for reduced UT/elbow compensations  Standing BIL scaption YB at wrist <90 deg 2x10 Modified counter plank shoulder tap x8 BIL cues for core engagement     [facility] Adult PT Treatment:                                                DATE: 11/24/23 Therapeutic Exercise: Doorway stretch 3 positions 30 sec x 2  Lateral cervical flexion 3 sec x 3 R/L  Manual Therapy: STM cervical and thoracic spine patient supine  Release work through the clavicle and 1st rib  Manual cervical traction  10-15 sec hold x 4  Neuromuscular re-ed: Chin tuck with noodle 10 sec x 10 Scap squeeze with noodle 10 sec x 10  Therapeutic Activity: Scap squeeze with ER yellow TB with noodle 3 sec x 10  W with noodle yellow TB x 10  Backward shoulder rolls  Thoracic extension with coregeous ball 5-10 sec x 10  Row blue TB 3 sec x 10  Bow and arrow 3 sec x 10 R/L  Self Care:  Office ergonomics   Sleeping positions and use of hand towel to support  c-spine                                                                                                                         Modaities:   Moist heat cervical spine x 10 min    PATIENT EDUCATION:  Education details: POC; HEP  Person educated: Patient Education method: Programmer, Multimedia, Facilities Manager, Actor cues, Verbal cues, and Handouts Education comprehension: verbalized understanding, returned demonstration, verbal cues required, tactile cues required, and needs further education  HOME EXERCISE PROGRAM: Access Code: [email or web address] URL: [email or web address] Date: 02/01/2024 Prepared by: Pietrina Jagodzinski  Exercises - Seated Cervical Retraction  - 2 x daily - 7 x weekly - 1-2 sets - 5-10 reps - 10 sec  hold - Supine Cervical Retraction with Towel  - 2 x daily - 7 x weekly - 1 sets - 5-10 reps - 10 sec  hold - Seated Scapular Retraction  - 2 x daily - 7 x weekly - 1-2 sets - 10 reps - 10 sec  hold - Shoulder External Rotation and Scapular Retraction  - 1-2 x daily - 7 x weekly - 1 sets - 10 reps - 3-5 sec   hold - Standing Shoulder W at Wall  - 1-2 x daily - 7 x weekly - 1 sets - 10 reps - 3 sec  hold - Doorway Pec Stretch at 60 Degrees Abduction  - 3 x daily - 7 x weekly - 1 sets - 3 reps - Doorway Pec Stretch at 90 Degrees Abduction  - 3 x daily - 7 x weekly - 1 sets - 3 reps - 30 seconds  hold - Doorway Pec Stretch at 120 Degrees Abduction  - 3 x daily - 7 x weekly - 1 sets - 3 reps - 30 second hold  hold - Standing Anatomical Position with  Scapular Retraction and Depression at Wall  - 2 x daily - 7 x weekly - 1 sets - 5-10 reps - 5-10 sec  hold - Supine Diaphragmatic Breathing  - 2 x daily - 7 x weekly - 1 sets - 10 reps - 4-6 sec  hold - Shoulder External Rotation and Scapular Retraction with Resistance  - 2 x daily - 7 x weekly - 1 sets - 10 reps - 3-5 sec  hold - Standing Bilateral Low Shoulder Row with Anchored Resistance  - 2 x daily - 7 x weekly - 1-3 sets - 10 reps - 2-3 sec  hold - Shoulder W - External Rotation with Resistance  - 2 x daily - 7 x weekly - 1-2 sets - 10 reps - 3 sec  hold - Drawing Bow  - 1 x daily - 7 x weekly - 1 sets - 10 reps - 3 sec  hold - Seated Cervical Sidebending AROM  - 2 x daily - 7 x weekly - 1 sets - 3-5 reps - 5-10 sec  hold - Shoulder extension with resistance - Neutral  - 2 x daily - 7 x weekly - 1 sets - 8 reps - Wall Angels  - 2 x daily - 7 x weekly - 1 sets - 8 reps - Sidelying Open Book Thoracic Lumbar Rotation and Extension  - 2 x daily - 7 x weekly - 1 sets - 10 reps - 3-5 sec  hold  Patient Education - Office Posture  ASSESSMENT:  CLINICAL IMPRESSION: 02/01/2024: Karna returns with improvement in neck pain and tightness. She has more mobility today and no headache. AROM assessment demonstarates some increased range in lateral flexion R > L. Patient has persistent tightness in the L > R cervical and shoulder girdle musculature including musculature in posterior, lateral and anterior cervical areas as well as pecs/upper traps/leveator. Patient has increased symptoms with more time at work and more time at the computer.  Patient will benefit from continued treatment including neuromuscular reeducation; stretching and manual work (TPR; myofacial release and gentle stretching); progressing with posterior shoulder girdle strengthening. Good response to treatment. Patient will benefit from continued physical therapy to address problems identified. Goals remain appropriate.        EVAL:  Patient is a 58 y.o. female who was seen today for physical therapy evaluation and treatment for chronic L sided neck pain with cervical DDD. She reports symptoms present since mid summer with no known injury. She has done well following cervical fusion 30+ years ago and does not recall any neck pain or problems. 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 Patient presents to clinic with forward posture and alignment; limited and painful cervical mobility/ROM; tightness through the thoracic spine; limited end range shoulder ROM; difficulty with functional activities; pain on a daily basis; intermittent headaches. Patient will benefit form PT to address problems identified.   OBJECTIVE IMPAIRMENTS: decreased activity tolerance, decreased mobility, decreased ROM, decreased strength, increased fascial restrictions, increased muscle spasms, improper body mechanics, postural dysfunction, and pain.    GOALS: Goals reviewed with patient? Yes  SHORT TERM GOALS: Target date: 02/06/2024   Independent in initial HEP  Baseline:  Goal status: met  2.  Improve cervical ROM by 5-7 degrees in flexion and lateral flexion  Baseline:  Goal status: partially met   3.  Patient reports ability to sleep with a pillow  Baseline: thin pillow  Goal status: met  LONG TERM GOALS: Target date: 03/28/2024   Decrease pain in L cervical spine and shoulder by 75-90% Baseline:  Goal status: on going   2.  Pain free cervical ROM WFL's  Baseline:  Goal status: on going  3.  Patient reports and demonstrates modifications in work and home ergonomics to avoid continued irritation of musculoskeletal symptoms  Baseline:  Goal status: on going  4.  Improve posture and alignment with patient to demonstrate more upright posture with activation of posterior shoulder girdle musculature  Baseline:  Goal status: on going   5.  Independent in advanced HEP  Baseline:  Goal status: on going    6.  Improve NDI by 5 points  Baseline: 17/50 01/09/24: 20/50; 40%  Goal status: on going    PLAN:  PT FREQUENCY: 2x/week  PT DURATION: 8 weeks  PLANNED INTERVENTIONS: 97164- PT Re-evaluation, 97110-Therapeutic exercises, 97530- Therapeutic activity, 97112- Neuromuscular re-education, 97535- Self Care, [id number]- Manual therapy, Patient/Family education, Taping, and Joint mobilization  PLAN FOR NEXT SESSION: review and progress exercises; continue with education re ergonomics and body mechanics; manual work and modalities as indicated  - progress with posterior shoulder girdle strengthening as tolerated    Morgon Pamer P. Ina PT, MPH 02/01/2024 7:15 AM

## 2024-02-08 ENCOUNTER — Ambulatory Visit: Payer: Self-pay | Admitting: Rehabilitative and Restorative Service Providers"

## 2024-02-08 ENCOUNTER — Encounter: Payer: Self-pay | Admitting: Rehabilitative and Restorative Service Providers"

## 2024-02-08 DIAGNOSIS — R29898 Other symptoms and signs involving the musculoskeletal system: Secondary | ICD-10-CM

## 2024-02-08 DIAGNOSIS — M539 Dorsopathy, unspecified: Secondary | ICD-10-CM

## 2024-02-08 DIAGNOSIS — R293 Abnormal posture: Secondary | ICD-10-CM

## 2024-02-08 DIAGNOSIS — M6281 Muscle weakness (generalized): Secondary | ICD-10-CM

## 2024-02-08 NOTE — Therapy (Signed)
 " OUTPATIENT PHYSICAL THERAPY CERVICAL TREATMENT   Patient Name: Kim Livingston MRN: [id number] DOB:1965-04-09, 58 y.o., female Today's Date: 02/08/2024  END OF SESSION:  PT End of Session - 02/08/24 1444     Visit Number 8    Number of Visits 16    Date for Recertification  03/28/24    Authorization Type cign[employer] no deductable; copay $60 - no auth required    Authorization Time Period calendar year    Authorization - Visit Number 8    Authorization - Number of Visits 60    PT Start Time 1445    PT Stop Time 1540    PT Time Calculation (min) 55 min    Activity Tolerance Patient tolerated treatment well           Past Medical History:  Diagnosis Date   Abscess of peritoneum (HCC) 2016   Edema    Fatty liver    GERD (gastroesophageal reflux disease)    hx   Heart murmur    Hypertension    borderline   Hypothyroidism    PONV (postoperative nausea and vomiting)    used scop patch-did well   Wears glasses    Past Surgical History:  Procedure Laterality Date   ABDOMINAL HYSTERECTOMY     Cervix is present.    ADENOIDECTOMY     APPENDECTOMY     BREAST LUMPECTOMY WITH NEEDLE LOCALIZATION Right 05/05/2012   Procedure: BREAST LUMPECTOMY WITH NEEDLE LOCALIZATION;  Surgeon: Elon CHRISTELLA Pacini, MD;  Location: [facility];  Service: General;  Laterality: Right;   cervical back fusion     Upper back.    CESAREAN SECTION     CHOLECYSTECTOMY     TONSILLECTOMY     WISDOM TOOTH EXTRACTION     Patient Active Problem List   Diagnosis Date Noted   IFG (impaired fasting glucose) 07/30/2021   Polyarthralgia 03/11/2020   Pain in both feet 11/16/2018   Fatty liver 08/28/2018   Essential hypertension 03/25/2016   Elevated liver enzymes 08/01/2015   Intraductal papilloma of breast 05/29/2012   Rosacea 10/19/2011   RHINITIS 11/08/2010   Hypothyroid 11/29/2005    PCP: Dr Dorothyann JONETTA Byars  REFERRING PROVIDER: Dr Dorothyann JONETTA Byars  REFERRING DIAG: cervical  radiculopathy   THERAPY DIAG:  Cervical dysfunction  Other symptoms and signs involving the musculoskeletal system  Muscle weakness (generalized)  Abnormal posture  Rationale for Evaluation and Treatment: Rehabilitation  ONSET DATE: 07/31/23  SUBJECTIVE:  SUBJECTIVE STATEMENT: 02/08/2024: patient reports that she is generally feeling better but did clean her house yesterday and noticed increased tightness on the L side of her neck. She is working on the exercises at home. She likes the trunk rotation exercise and doorway exercise best.   Eval: Patient report flare up of chronic L sided neck pain. She has a history of cervical fusion ~ 30 years ago. Current symptoms started 07/31/23 with no known injury. Symptoms have persisted and have not changed.  Hand dominance: Right  PERTINENT HISTORY:  Chronic L sided neck pain; prior cervical fusion C 5/6 ~30 years ago; HTN; hypothyroidism; obesity; bilat foot pain; dermatitis   PAIN:  Are you having pain? Yes: NPRS scale: 2-3/10   Pain location: L cervical spine; up toward base of skull   Pain description: tightness turning to L  Aggravating factors: lying down with a pillow; looking down slightly; turning L is the worst  Relieving factors: biofreeze; heat; massager; advil; supporting neck with thin blanket when lying down   PRECAUTIONS: Cervical prior cervical fusion    WEIGHT BEARING RESTRICTIONS: No  FALLS:  Has patient fallen in last 6 months? 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  DIAGNOSTIC FINDINGS:  Xray 10/27/23: Straightening of the cervical spine. Suboptimal visualization of C7 and cervicothoracic junction. Dens and lateral masses are within normal limits. Multilevel degenerative osteophytes. Moderate disc space narrowing C4-C5 and C5-C6. Interbody fusion at C6-C7. Multilevel facet degenerative changes. Suspect diffuse foraminal narrowing bilaterally C3 through C6.   IMPRESSION: Straightening of the cervical spine with multilevel degenerative changes. Interbody fusion at C6-C7  PATIENT SURVEYS:  NDI:  NECK DISABILITY INDEX  Date: 11/14/23 Score  Pain intensity 2 = The pain is moderate at the moment  2. Personal care (washing, dressing, etc.) 2 = It is painful to look after myself and I am slow and careful  3. Lifting 3 = Pain prevents me from lifting heavy weights but I can manage light to medium   weights if they are conveniently positioned  4. Reading 1 = I can read as much as I want to with slight pain in my neck  5. Headaches 3 = I have moderate headaches, which come frequently  6. Concentration 1 =  I can concentrate fully when I want to with slight difficulty   7. Work 1 =  I can only do my usual work, but no more  8. Driving 1 =  I can drive my car as long as I want with slight pain in my neck  9. Sleeping 2 = My sleep is mildly disturbed (1-2 hrs sleepless)  10. Recreation 1 =  I am able to engage in all my recreation activities, with some pain in my neck  Total 17/50   Minimum Detectable Change (90% confidence): 5 points or 10% points  01/09/24: 20/50; 40% limitation    SENSATION: WFL  POSTURE: Patient presents with head forward posture with increased thoracic kyphosis; shoulders rounded and elevated; scapulae abducted and rotated along  the thoracic spine; head of the humerus anterior in orientation.   PALPATION: Muscular tightness L and R cervical paraspinals; pecs; upper traps; leveator; biceps; anterior deltoid; SCM 02/01/24: persistent tightness L > R cervical musculature as noted    CERVICAL ROM:   Active ROM A/PROM (deg) eval AROM 12/07/23  AROM  01/09/24 AROM 02/01/24  Flexion 48 49 46 49  Extension 6 pain L 25 31 31   Right lateral flexion 9 pain L 17 12 discomfort L  19 mild discomfort   Left lateral flexion 12 pain L 17 tight discomfort 15 discomfort L  17 mild discomfort   Right rotation 40 pain L 57 56 56  Left rotation 32 pain L  40 tight discomfort  36 discomfort L  35 mild discomfort    (Blank rows = not tested)  UPPER EXTREMITY ROM:   01/09/24: persistent end range tightness bilat shoulders   Active ROM Right eval Left eval  Shoulder flexion Tight end range Tight end range  Shoulder extension    Shoulder abduction Tight end range Tight end range  Shoulder adduction    Shoulder extension    Shoulder internal rotation Tight end range Tight end range  Shoulder external rotation    Elbow flexion    Elbow extension    Wrist flexion    Wrist extension    Wrist ulnar deviation    Wrist radial deviation    Wrist pronation    Wrist supination     (Blank rows = not tested)  UPPER EXTREMITY MMT: strength bilat UE's WFL - poor strength in posterior shoulder girdle - middle and lower traps   01/09/24: decreased posterior shoulder girdle strength middle and lower traps   MMT Right eval Left eval  Shoulder flexion    Shoulder extension    Shoulder abduction    Shoulder adduction    Shoulder extension    Shoulder internal rotation    Shoulder external rotation    Middle trapezius    Lower trapezius    Elbow flexion    Elbow extension    Wrist flexion    Wrist extension    Wrist ulnar deviation    Wrist radial deviation    Wrist pronation    Wrist supination    Grip strength      (Blank rows = not tested)  CERVICAL SPECIAL TESTS: cervical compression/distraction negative    [facility] Adult PT Treatment:                                                DATE: 02/08/24 Therapeutic Exercise: Doorway stretch 3 positions 30 sec x 2 slight turn to R to increase stretch to L  Lateral cervical flexion 3 sec x 3 R/L  Manual Therapy: STM cervical and thoracic spine patient supine  Release work through the clavicle and 1st rib  Manual cervical traction 10-15 sec hold several reps Traction through the L/R UE in neutral  Myofacial release pecs/anterior chest; L lateral cervical area  Gentle cervical stretch laterally to tolerance  Therapeutic activity: Open book sidelying R/L 5 sec x 10  Neuromuscular re-ed: Chin tuck with noodle 10 sec x 10 Scap squeeze with ER red TB x 20 W with noodle red TB x 20  Self Care:  Office ergonomics   Sleeping positions and use of hand towel to support  c-spine  Modaities:   Moist heat cervical spine x 10 min    [facility] Adult PT Treatment:                                                DATE: 02/01/24 Therapeutic Exercise: Doorway stretch 3 positions 30 sec x 2  Lateral cervical flexion 3 sec x 3 R/L  Manual Therapy: STM cervical and thoracic spine patient supine  Release work through the clavicle and 1st rib  Manual cervical traction 10-15 sec hold x 4  Myofacial release pecs/anterior chest; L lateral cervical area  Gentle cervical stretch laterally to tolerance - note limited mobility  Therapeutic activity: Open book sidelying R/L 5 sec x 10  Neuromuscular re-ed: Chin tuck with noodle 10 sec x 10 Self Care:  Office ergonomics   Sleeping positions and use of hand towel to support  c-spine                                                                                                                         Modaities:    Moist heat cervical spine x 10 min     PATIENT EDUCATION:  Education details: POC; HEP  Person educated: Patient Education method: Programmer, Multimedia, Facilities Manager, Actor cues, Verbal cues, and Handouts Education comprehension: verbalized understanding, returned demonstration, verbal cues required, tactile cues required, and needs further education  HOME EXERCISE PROGRAM: Access Code: [email or web address] URL: [email or web address] Date: 02/01/2024 Prepared by: Meira Wahba  Exercises - Seated Cervical Retraction  - 2 x daily - 7 x weekly - 1-2 sets - 5-10 reps - 10 sec  hold - Supine Cervical Retraction with Towel  - 2 x daily - 7 x weekly - 1 sets - 5-10 reps - 10 sec  hold - Seated Scapular Retraction  - 2 x daily - 7 x weekly - 1-2 sets - 10 reps - 10 sec  hold - Shoulder External Rotation and Scapular Retraction  - 1-2 x daily - 7 x weekly - 1 sets - 10 reps - 3-5 sec   hold - Standing Shoulder W at Wall  - 1-2 x daily - 7 x weekly - 1 sets - 10 reps - 3 sec  hold - Doorway Pec Stretch at 60 Degrees Abduction  - 3 x daily - 7 x weekly - 1 sets - 3 reps - Doorway Pec Stretch at 90 Degrees Abduction  - 3 x daily - 7 x weekly - 1 sets - 3 reps - 30 seconds  hold - Doorway Pec Stretch at 120 Degrees Abduction  - 3 x daily - 7 x weekly - 1 sets - 3 reps - 30 second hold  hold - Standing Anatomical Position with Scapular Retraction and Depression at Wall  - 2 x daily - 7 x weekly - 1 sets - 5-10 reps - 5-10 sec  hold - Supine Diaphragmatic Breathing  - 2 x daily - 7 x weekly - 1 sets - 10 reps - 4-6 sec  hold - Shoulder External Rotation and Scapular Retraction with Resistance  - 2 x daily - 7 x weekly - 1 sets - 10 reps - 3-5 sec  hold - Standing Bilateral Low Shoulder Row with Anchored Resistance  - 2 x daily - 7 x weekly - 1-3 sets - 10 reps - 2-3 sec  hold - Shoulder W - External Rotation with Resistance  - 2 x daily - 7 x weekly - 1-2 sets - 10 reps - 3 sec  hold - Drawing Bow  - 1 x  daily - 7 x weekly - 1 sets - 10 reps - 3 sec  hold - Seated Cervical Sidebending AROM  - 2 x daily - 7 x weekly - 1 sets - 3-5 reps - 5-10 sec  hold - Shoulder extension with resistance - Neutral  - 2 x daily - 7 x weekly - 1 sets - 8 reps - Wall Angels  - 2 x daily - 7 x weekly - 1 sets - 8 reps - Sidelying Open Book Thoracic Lumbar Rotation and Extension  - 2 x daily - 7 x weekly - 1 sets - 10 reps - 3-5 sec  hold  Patient Education - Office Posture  ASSESSMENT:  CLINICAL IMPRESSION: 02/08/2024: Kim Livingston has some increased tightness in the L shoulder and cervical area. Modified doorway stretch to turn slightly to R to improve stretch through the L shoulder. No headache in the past week. Patient has persistent tightness in the L > R cervical and shoulder girdle musculature including musculature in posterior, lateral and anterior cervical areas as well as pecs/upper traps/leveator. Continued treatment including neuromuscular reeducation; stretching and manual work (TPR; myofacial release and gentle stretching). Good response to treatment.    EVAL: Patient is a 58 y.o. female who was seen today for physical therapy evaluation and treatment for chronic L sided neck pain with cervical DDD. She reports symptoms present since mid summer with no known injury. She has done well following cervical fusion 30+ years ago and does not recall any neck pain or problems. 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 Patient presents to clinic with forward posture and alignment; limited and painful cervical mobility/ROM; tightness through the thoracic spine; limited end range shoulder ROM; difficulty with functional activities; pain on a daily basis; intermittent headaches. Patient will benefit form PT to address problems identified.   OBJECTIVE IMPAIRMENTS: decreased activity tolerance, decreased mobility, decreased ROM, decreased strength, increased fascial restrictions, increased  muscle spasms, improper body mechanics, postural dysfunction, and pain.    GOALS: Goals reviewed with patient? Yes  SHORT TERM GOALS: Target date: 02/06/2024   Independent in initial HEP  Baseline:  Goal status: met  2.  Improve cervical ROM by 5-7 degrees in flexion and lateral flexion  Baseline:  Goal status: partially met   3.  Patient reports ability to sleep with a pillow  Baseline: thin pillow  Goal status: met  LONG TERM GOALS: Target date: 03/28/2024   Decrease pain in L cervical spine and shoulder by 75-90% Baseline:  Goal status: on going   2.  Pain free cervical ROM WFL's  Baseline:  Goal status: on going  3.  Patient reports and demonstrates modifications in work and home ergonomics to avoid continued irritation of musculoskeletal symptoms  Baseline:  Goal status: on going  4.  Improve posture and alignment with patient to demonstrate more upright posture with activation of posterior shoulder girdle musculature  Baseline:  Goal status: on going   5.  Independent in advanced HEP  Baseline:  Goal status: on going   6.  Improve NDI by 5 points  Baseline: 17/50 01/09/24: 20/50; 40%  Goal status: on going    PLAN:  PT FREQUENCY: 2x/week  PT DURATION: 8 weeks  PLANNED INTERVENTIONS: 97164- PT Re-evaluation, 97110-Therapeutic exercises, 97530- Therapeutic activity, 97112- Neuromuscular re-education, 97535- Self Care, [id number]- Manual therapy, Patient/Family education, Taping, and Joint mobilization  PLAN FOR NEXT SESSION: review and progress exercises; continue with education re ergonomics and body mechanics; manual work and modalities as indicated  - progress with posterior shoulder girdle strengthening as tolerated    Leba Tibbitts P. Ina PT, MPH 02/08/2024 3:31 PM  "

## 2024-02-20 ENCOUNTER — Encounter: Payer: Self-pay | Admitting: Rehabilitative and Restorative Service Providers"

## 2024-02-20 ENCOUNTER — Ambulatory Visit: Attending: Family Medicine | Admitting: Rehabilitative and Restorative Service Providers"

## 2024-02-20 DIAGNOSIS — R29898 Other symptoms and signs involving the musculoskeletal system: Secondary | ICD-10-CM | POA: Insufficient documentation

## 2024-02-20 DIAGNOSIS — M6281 Muscle weakness (generalized): Secondary | ICD-10-CM | POA: Diagnosis present

## 2024-02-20 DIAGNOSIS — M539 Dorsopathy, unspecified: Secondary | ICD-10-CM | POA: Insufficient documentation

## 2024-02-20 DIAGNOSIS — R293 Abnormal posture: Secondary | ICD-10-CM | POA: Diagnosis present

## 2024-02-20 NOTE — Therapy (Signed)
 " OUTPATIENT PHYSICAL THERAPY CERVICAL TREATMENT   Patient Name: Kim Livingston MRN: [id number] DOB:1966/02/15, 59 y.o., female Today's Date: 02/20/2024  END OF SESSION:  PT End of Session - 02/20/24 0716     Visit Number 9    Number of Visits 16    Date for Recertification  03/28/24    Authorization Type [employer] no deductable; copay $60 - no auth required    Authorization Time Period calendar year    Authorization - Visit Number 9    Authorization - Number of Visits 60    PT Start Time 0715    PT Stop Time 0805    PT Time Calculation (min) 50 min           Past Medical History:  Diagnosis Date   Abscess of peritoneum (HCC) 2016   Edema    Fatty liver    GERD (gastroesophageal reflux disease)    hx   Heart murmur    Hypertension    borderline   Hypothyroidism    PONV (postoperative nausea and vomiting)    used scop patch-did well   Wears glasses    Past Surgical History:  Procedure Laterality Date   ABDOMINAL HYSTERECTOMY     Cervix is present.    ADENOIDECTOMY     APPENDECTOMY     BREAST LUMPECTOMY WITH NEEDLE LOCALIZATION Right 05/05/2012   Procedure: BREAST LUMPECTOMY WITH NEEDLE LOCALIZATION;  Surgeon: Elon CHRISTELLA Pacini, MD;  Location: [facility];  Service: General;  Laterality: Right;   cervical back fusion     Upper back.    CESAREAN SECTION     CHOLECYSTECTOMY     TONSILLECTOMY     WISDOM TOOTH EXTRACTION     Patient Active Problem List   Diagnosis Date Noted   IFG (impaired fasting glucose) 07/30/2021   Polyarthralgia 03/11/2020   Pain in both feet 11/16/2018   Fatty liver 08/28/2018   Essential hypertension 03/25/2016   Elevated liver enzymes 08/01/2015   Intraductal papilloma of breast 05/29/2012   Rosacea 10/19/2011   RHINITIS 11/08/2010   Hypothyroid 11/29/2005    PCP: Dr Dorothyann JONETTA Byars  REFERRING PROVIDER: Dr Dorothyann JONETTA Byars  REFERRING DIAG: cervical radiculopathy   THERAPY DIAG:  Cervical dysfunction  Other  symptoms and signs involving the musculoskeletal system  Muscle weakness (generalized)  Abnormal posture  Rationale for Evaluation and Treatment: Rehabilitation  ONSET DATE: 07/31/23  SUBJECTIVE:  SUBJECTIVE STATEMENT: 02/20/2024: patient reports that has had increased tightness in the L neck area higher up than usual, since she was last here. Not sure of anything she did differently.   Eval: Patient report flare up of chronic L sided neck pain. She has a history of cervical fusion ~ 30 years ago. Current symptoms started 07/31/23 with no known injury. Symptoms have persisted and have not changed.  Hand dominance: Right  PERTINENT HISTORY:  Chronic L sided neck pain; prior cervical fusion C 5/6 ~30 years ago; HTN; hypothyroidism; obesity; bilat foot pain; dermatitis   PAIN:  Are you having pain? Yes: NPRS scale: 3-4/10   Pain location: L cervical spine; up toward base of skull   Pain description: tightness turning to L  Aggravating factors: lying down with a pillow; looking down slightly; turning L is the worst  Relieving factors: biofreeze; heat; massager; advil; supporting neck with thin blanket when lying down   PRECAUTIONS: Cervical prior cervical fusion    WEIGHT BEARING RESTRICTIONS: No  FALLS:  Has patient fallen in last 6 months? 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  DIAGNOSTIC FINDINGS:  Xray 10/27/23: Straightening of the cervical spine. Suboptimal visualization of C7 and cervicothoracic junction. Dens and lateral masses are within normal limits. Multilevel degenerative osteophytes. Moderate disc space narrowing C4-C5 and C5-C6. Interbody fusion at C6-C7. Multilevel facet degenerative changes. Suspect diffuse foraminal narrowing bilaterally C3 through C6.   IMPRESSION: Straightening of the cervical spine with multilevel degenerative changes. Interbody fusion at C6-C7  PATIENT SURVEYS:  NDI:  NECK DISABILITY INDEX  Date: 11/14/23 Score  Pain intensity 2 = The pain is moderate at the moment  2. Personal care (washing, dressing, etc.) 2 = It is painful to look after myself and I am slow and careful  3. Lifting 3 = Pain prevents me from lifting heavy weights but I can manage light to medium   weights if they are conveniently positioned  4. Reading 1 = I can read as much as I want to with slight pain in my neck  5. Headaches 3 = I have moderate headaches, which come frequently  6. Concentration 1 =  I can concentrate fully when I want to with slight difficulty   7. Work 1 =  I can only do my usual work, but no more  8. Driving 1 =  I can drive my car as long as I want with slight pain in my neck  9. Sleeping 2 = My sleep is mildly disturbed (1-2 hrs sleepless)  10. Recreation 1 =  I am able to engage in all my recreation activities, with some pain in my neck  Total 17/50   Minimum Detectable Change (90% confidence): 5 points or 10% points  01/09/24: 20/50; 40% limitation    SENSATION: WFL  POSTURE: Patient presents with head forward posture with increased thoracic kyphosis; shoulders rounded and elevated; scapulae abducted and rotated along the thoracic spine; head of the humerus anterior in orientation.   PALPATION: Muscular tightness  L and R cervical paraspinals; pecs; upper traps; leveator;  biceps; anterior deltoid; SCM 02/01/24: persistent tightness L > R cervical musculature as noted    CERVICAL ROM:   Active ROM A/PROM (deg) eval AROM 12/07/23  AROM  01/09/24 AROM 02/01/24  Flexion 48 49 46 49  Extension 6 pain L 25 31 31   Right lateral flexion 9 pain L 17 12 discomfort L  19 mild discomfort   Left lateral flexion 12 pain L 17 tight discomfort 15 discomfort L  17 mild discomfort   Right rotation 40 pain L 57 56 56  Left rotation 32 pain L  40 tight discomfort  36 discomfort L  35 mild discomfort    (Blank rows = not tested)  UPPER EXTREMITY ROM:   01/09/24: persistent end range tightness bilat shoulders   Active ROM Right eval Left eval  Shoulder flexion Tight end range Tight end range  Shoulder extension    Shoulder abduction Tight end range Tight end range  Shoulder adduction    Shoulder extension    Shoulder internal rotation Tight end range Tight end range  Shoulder external rotation    Elbow flexion    Elbow extension    Wrist flexion    Wrist extension    Wrist ulnar deviation    Wrist radial deviation    Wrist pronation    Wrist supination     (Blank rows = not tested)  UPPER EXTREMITY MMT: strength bilat UE's WFL - poor strength in posterior shoulder girdle - middle and lower traps   01/09/24: decreased posterior shoulder girdle strength middle and lower traps   MMT Right eval Left eval  Shoulder flexion    Shoulder extension    Shoulder abduction    Shoulder adduction    Shoulder extension    Shoulder internal rotation    Shoulder external rotation    Middle trapezius    Lower trapezius    Elbow flexion    Elbow extension    Wrist flexion    Wrist extension    Wrist ulnar deviation    Wrist radial deviation    Wrist pronation    Wrist supination    Grip strength     (Blank rows = not tested)  CERVICAL SPECIAL TESTS: cervical compression/distraction negative    [facility] Adult PT Treatment:                                                 DATE: 02/20/24 Therapeutic Exercise: Doorway stretch 3 positions 30 sec x 2 slight turn to R to increase stretch to L  Lateral cervical flexion 3 sec x 3 R/L  Manual Therapy: STM cervical and thoracic spine patient supine  Release work through the clavicle and 1st rib  Manual cervical traction 10-15 sec hold several reps Traction through the L/R UE in neutral  Myofacial release pecs/anterior chest; L lateral cervical area  Gentle cervical stretch laterally to tolerance  Therapeutic activity: Open book sidelying R/L 5 sec x 10  Row green TB 3 sec x 10  Neuromuscular re-ed: Chin tuck with noodle 10 sec x 10 Thoracic rotation in sidelying  Chin tuck with coregeous ball   Self Care:  Office ergonomics   Sleeping positions and use of hand towel to support  c-spine  Modaities:   Moist heat cervical spine x 10 min    [facility] Adult PT Treatment:                                                DATE: 02/08/24 Therapeutic Exercise: Doorway stretch 3 positions 30 sec x 2 slight turn to R to increase stretch to L  Lateral cervical flexion 3 sec x 3 R/L  Manual Therapy: STM cervical and thoracic spine patient supine  Release work through the clavicle and 1st rib  Manual cervical traction 10-15 sec hold several reps Traction through the L/R UE in neutral  Myofacial release pecs/anterior chest; L lateral cervical area  Gentle cervical stretch laterally to tolerance  Therapeutic activity: Open book sidelying R/L 5 sec x 10  Neuromuscular re-ed: Chin tuck with noodle 10 sec x 10 Scap squeeze with ER red TB x 20 W with noodle red TB x 20  Self Care:  Office ergonomics   Sleeping positions and use of hand towel to support  c-spine                                                                                                                         Modaities:   Moist  heat cervical spine x 10 min      PATIENT EDUCATION:  Education details: POC; HEP  Person educated: Patient Education method: Programmer, Multimedia, Facilities Manager, Actor cues, Verbal cues, and Handouts Education comprehension: verbalized understanding, returned demonstration, verbal cues required, tactile cues required, and needs further education  HOME EXERCISE PROGRAM: Access Code: [email or web address] URL: [email or web address] Date: 02/01/2024 Prepared by: Shelbylynn Walczyk  Exercises - Seated Cervical Retraction  - 2 x daily - 7 x weekly - 1-2 sets - 5-10 reps - 10 sec  hold - Supine Cervical Retraction with Towel  - 2 x daily - 7 x weekly - 1 sets - 5-10 reps - 10 sec  hold - Seated Scapular Retraction  - 2 x daily - 7 x weekly - 1-2 sets - 10 reps - 10 sec  hold - Shoulder External Rotation and Scapular Retraction  - 1-2 x daily - 7 x weekly - 1 sets - 10 reps - 3-5 sec   hold - Standing Shoulder W at Wall  - 1-2 x daily - 7 x weekly - 1 sets - 10 reps - 3 sec  hold - Doorway Pec Stretch at 60 Degrees Abduction  - 3 x daily - 7 x weekly - 1 sets - 3 reps - Doorway Pec Stretch at 90 Degrees Abduction  - 3 x daily - 7 x weekly - 1 sets - 3 reps - 30 seconds  hold - Doorway Pec Stretch at 120 Degrees Abduction  - 3 x daily - 7 x weekly - 1 sets - 3 reps - 30 second hold  hold -  Standing Anatomical Position with Scapular Retraction and Depression at Wall  - 2 x daily - 7 x weekly - 1 sets - 5-10 reps - 5-10 sec  hold - Supine Diaphragmatic Breathing  - 2 x daily - 7 x weekly - 1 sets - 10 reps - 4-6 sec  hold - Shoulder External Rotation and Scapular Retraction with Resistance  - 2 x daily - 7 x weekly - 1 sets - 10 reps - 3-5 sec  hold - Standing Bilateral Low Shoulder Row with Anchored Resistance  - 2 x daily - 7 x weekly - 1-3 sets - 10 reps - 2-3 sec  hold - Shoulder W - External Rotation with Resistance  - 2 x daily - 7 x weekly - 1-2 sets - 10 reps - 3 sec  hold - Drawing Bow  - 1 x daily  - 7 x weekly - 1 sets - 10 reps - 3 sec  hold - Seated Cervical Sidebending AROM  - 2 x daily - 7 x weekly - 1 sets - 3-5 reps - 5-10 sec  hold - Shoulder extension with resistance - Neutral  - 2 x daily - 7 x weekly - 1 sets - 8 reps - Wall Angels  - 2 x daily - 7 x weekly - 1 sets - 8 reps - Sidelying Open Book Thoracic Lumbar Rotation and Extension  - 2 x daily - 7 x weekly - 1 sets - 10 reps - 3-5 sec  hold  Patient Education - Office Posture  ASSESSMENT:  CLINICAL IMPRESSION: 02/20/2024: Karna has some increased tightness in the L upper neck since last visit. Note significant tightness in the L > R upper thoracic spine. Added thoracic mobs and manual work through thoracic spine in sitting and sidelying. Added rotational upper back/thoracic stretch sitting and sidelying. Continued with manual work through L upper quarter patient in supine. Added assisted lateral cervical flexion. Patient has persistent tightness in the L > R cervical and shoulder girdle musculature including musculature in posterior, lateral and anterior cervical areas as well as pecs/upper traps/leveator. Continued treatment including neuromuscular reeducation; stretching and manual work (TPR; myofacial release and gentle stretching). Good response to treatment.    EVAL: Patient is a 59 y.o. female who was seen today for physical therapy evaluation and treatment for chronic L sided neck pain with cervical DDD. She reports symptoms present since mid summer with no known injury. She has done well following cervical fusion 30+ years ago and does not recall any neck pain or problems. 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 Patient presents to clinic with forward posture and alignment; limited and painful cervical mobility/ROM; tightness through the thoracic spine; limited end range shoulder ROM; difficulty with functional activities; pain on a daily basis; intermittent headaches. Patient will  benefit form PT to address problems identified.   OBJECTIVE IMPAIRMENTS: decreased activity tolerance, decreased mobility, decreased ROM, decreased strength, increased fascial restrictions, increased muscle spasms, improper body mechanics, postural dysfunction, and pain.    GOALS: Goals reviewed with patient? Yes  SHORT TERM GOALS: Target date: 02/06/2024   Independent in initial HEP  Baseline:  Goal status: met  2.  Improve cervical ROM by 5-7 degrees in flexion and lateral flexion  Baseline:  Goal status: partially met   3.  Patient reports ability to sleep with a pillow  Baseline: thin pillow  Goal status: met  LONG TERM GOALS: Target date: 03/28/2024  Decrease pain in L cervical spine and shoulder by 75-90% Baseline:  Goal status: on going   2.  Pain free cervical ROM WFL's  Baseline:  Goal status: on going  3.  Patient reports and demonstrates modifications in work and home ergonomics to avoid continued irritation of musculoskeletal symptoms  Baseline:  Goal status: on going  4.  Improve posture and alignment with patient to demonstrate more upright posture with activation of posterior shoulder girdle musculature  Baseline:  Goal status: on going   5.  Independent in advanced HEP  Baseline:  Goal status: on going   6.  Improve NDI by 5 points  Baseline: 17/50 01/09/24: 20/50; 40%  Goal status: on going    PLAN:  PT FREQUENCY: 2x/week  PT DURATION: 8 weeks  PLANNED INTERVENTIONS: 97164- PT Re-evaluation, 97110-Therapeutic exercises, 97530- Therapeutic activity, 97112- Neuromuscular re-education, 97535- Self Care, [id number]- Manual therapy, Patient/Family education, Taping, and Joint mobilization  PLAN FOR NEXT SESSION: review and progress exercises; continue with education re ergonomics and body mechanics; manual work and modalities as indicated  - progress with posterior shoulder girdle strengthening as tolerated    Adrea Sherpa P. Ina PT, MPH 02/20/2024 7:17  AM  "

## 2024-02-29 ENCOUNTER — Ambulatory Visit: Admitting: Rehabilitative and Restorative Service Providers"

## 2024-03-05 ENCOUNTER — Ambulatory Visit: Admitting: Rehabilitative and Restorative Service Providers"

## 2024-03-05 ENCOUNTER — Encounter: Payer: Self-pay | Admitting: Rehabilitative and Restorative Service Providers"

## 2024-03-05 DIAGNOSIS — R293 Abnormal posture: Secondary | ICD-10-CM

## 2024-03-05 DIAGNOSIS — M539 Dorsopathy, unspecified: Secondary | ICD-10-CM | POA: Diagnosis not present

## 2024-03-05 DIAGNOSIS — M6281 Muscle weakness (generalized): Secondary | ICD-10-CM

## 2024-03-05 DIAGNOSIS — R29898 Other symptoms and signs involving the musculoskeletal system: Secondary | ICD-10-CM

## 2024-03-05 NOTE — Therapy (Signed)
 " OUTPATIENT PHYSICAL THERAPY CERVICAL TREATMENT   Patient Name: Kim Livingston MRN: [id number] DOB:03-24-1965, 59 y.o., female Today's Date: 03/05/2024  END OF SESSION:  PT End of Session - 03/05/24 1102     Visit Number 10    Number of Visits 16    Date for Recertification  03/28/24    Authorization Type c[employer] no deductable; copay $60 - no auth required    Authorization Time Period calendar year    Authorization - Visit Number 10    Authorization - Number of Visits 60    PT Start Time 1100    PT Stop Time 1150    PT Time Calculation (min) 50 min    Activity Tolerance Patient tolerated treatment well           Past Medical History:  Diagnosis Date   Abscess of peritoneum (HCC) 2016   Edema    Fatty liver    GERD (gastroesophageal reflux disease)    hx   Heart murmur    Hypertension    borderline   Hypothyroidism    PONV (postoperative nausea and vomiting)    used scop patch-did well   Wears glasses    Past Surgical History:  Procedure Laterality Date   ABDOMINAL HYSTERECTOMY     Cervix is present.    ADENOIDECTOMY     APPENDECTOMY     BREAST LUMPECTOMY WITH NEEDLE LOCALIZATION Right 05/05/2012   Procedure: BREAST LUMPECTOMY WITH NEEDLE LOCALIZATION;  Surgeon: Elon CHRISTELLA Pacini, MD;  Location: [facility];  Service: General;  Laterality: Right;   cervical back fusion     Upper back.    CESAREAN SECTION     CHOLECYSTECTOMY     TONSILLECTOMY     WISDOM TOOTH EXTRACTION     Patient Active Problem List   Diagnosis Date Noted   IFG (impaired fasting glucose) 07/30/2021   Polyarthralgia 03/11/2020   Pain in both feet 11/16/2018   Fatty liver 08/28/2018   Essential hypertension 03/25/2016   Elevated liver enzymes 08/01/2015   Intraductal papilloma of breast 05/29/2012   Rosacea 10/19/2011   RHINITIS 11/08/2010   Hypothyroid 11/29/2005    PCP: Dr Dorothyann JONETTA Byars  REFERRING PROVIDER: Dr Dorothyann JONETTA Byars  REFERRING DIAG: cervical  radiculopathy   THERAPY DIAG:  Cervical dysfunction  Other symptoms and signs involving the musculoskeletal system  Muscle weakness (generalized)  Abnormal posture  Rationale for Evaluation and Treatment: Rehabilitation  ONSET DATE: 07/31/23  SUBJECTIVE:  SUBJECTIVE STATEMENT: 03/05/2024: patient reports that she fell down her steps last week and has had a large bruise on her L side in the rib area. She tensed her neck to keep from hitting her head. Things are beginning to feel better now. The stretching feels the best when she is in here. She was sore following last treatment but not as much so as the visit before.    Eval: Patient report flare up of chronic L sided neck pain. She has a history of cervical fusion ~ 30 years ago. Current symptoms started 07/31/23 with no known injury. Symptoms have persisted and have not changed.  Hand dominance: Right  PERTINENT HISTORY:  Chronic L sided neck pain; prior cervical fusion C 5/6 ~30 years ago; HTN; hypothyroidism; obesity; bilat foot pain; dermatitis   PAIN:  Are you having pain? Yes: NPRS scale: 3-4/10   Pain location: L cervical spine; up toward base of skull   Pain description: tightness turning to L  Aggravating factors: lying down with a pillow; looking down slightly; turning L is the worst  Relieving factors: biofreeze; heat; massager; advil; supporting neck with thin blanket when lying down   PRECAUTIONS: Cervical prior cervical fusion    WEIGHT BEARING RESTRICTIONS: No  FALLS:  Has patient fallen in last 6 months? 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  DIAGNOSTIC FINDINGS:  Xray 10/27/23: Straightening of the cervical spine. Suboptimal visualization of C7 and cervicothoracic junction. Dens and lateral masses are within normal limits. Multilevel degenerative osteophytes. Moderate disc space narrowing C4-C5 and C5-C6. Interbody fusion at C6-C7. Multilevel facet degenerative changes. Suspect diffuse foraminal narrowing bilaterally C3 through C6.   IMPRESSION: Straightening of the cervical spine with multilevel degenerative changes. Interbody fusion at C6-C7  PATIENT SURVEYS:  NDI:  NECK DISABILITY INDEX  Date: 11/14/23 Score  Pain intensity 2 = The pain is moderate at the moment  2. Personal care (washing, dressing, etc.) 2 = It is painful to look after myself and I am slow and careful  3. Lifting 3 = Pain prevents me from lifting heavy weights but I can manage light to medium   weights if they are conveniently positioned  4. Reading 1 = I can read as much as I want to with slight pain in my neck  5. Headaches 3 = I have moderate headaches, which come frequently  6. Concentration 1 =  I can concentrate fully when I want to with slight difficulty   7. Work 1 =  I can only do my usual work, but no more  8. Driving 1 =  I can drive my car as long as I want with slight pain in my neck  9. Sleeping 2 = My sleep is mildly disturbed (1-2 hrs sleepless)  10. Recreation 1 =  I am able to engage in all my recreation activities, with some pain in my neck  Total 17/50   Minimum Detectable Change (90% confidence): 5 points or 10% points  01/09/24: 20/50; 40% limitation  SENSATION: WFL  POSTURE: Patient presents with head forward posture with increased  thoracic kyphosis; shoulders rounded and elevated; scapulae abducted and rotated along the thoracic spine; head of the humerus anterior in orientation.   PALPATION: Muscular tightness L and R cervical paraspinals; pecs; upper traps; leveator; biceps; anterior deltoid; SCM 02/01/24: persistent tightness L > R cervical musculature as noted    CERVICAL ROM:   03/05/24: stiffness and discomfort with all cervical motions Active ROM A/PROM (deg) eval AROM 12/07/23  AROM  01/09/24 AROM 02/01/24 AROM 03/05/24  Flexion 48 49 46 49 44  Extension 6 pain L 25 31 31 28   Right lateral flexion 9 pain L 17 12 discomfort L  19 mild discomfort  20  Left lateral flexion 12 pain L 17 tight discomfort 15 discomfort L  17 mild discomfort  15  Right rotation 40 pain L 57 56 56 53  Left rotation 32 pain L  40 tight discomfort  36 discomfort L  35 mild discomfort  33   (Blank rows = not tested)  UPPER EXTREMITY ROM:   01/09/24: persistent end range tightness bilat shoulders   Active ROM Right eval Left eval  Shoulder flexion Tight end range Tight end range  Shoulder extension    Shoulder abduction Tight end range Tight end range  Shoulder adduction    Shoulder extension    Shoulder internal rotation Tight end range Tight end range  Shoulder external rotation    Elbow flexion    Elbow extension    Wrist flexion    Wrist extension    Wrist ulnar deviation    Wrist radial deviation    Wrist pronation    Wrist supination     (Blank rows = not tested)  UPPER EXTREMITY MMT: strength bilat UE's WFL - poor strength in posterior shoulder girdle - middle and lower traps   01/09/24: decreased posterior shoulder girdle strength middle and lower traps   MMT Right eval Left eval  Shoulder flexion    Shoulder extension    Shoulder abduction    Shoulder adduction    Shoulder extension    Shoulder internal rotation    Shoulder external rotation    Middle trapezius    Lower trapezius    Elbow flexion     Elbow extension    Wrist flexion    Wrist extension    Wrist ulnar deviation    Wrist radial deviation    Wrist pronation    Wrist supination    Grip strength     (Blank rows = not tested)  CERVICAL SPECIAL TESTS: cervical compression/distraction negative    [facility] Adult PT Treatment:                                                DATE: 03/05/24 Therapeutic Exercise: Doorway stretch 3 positions 30 sec x 2 slight turn to R to increase stretch to L  Lateral cervical flexion 3 sec x 3 R/L  Manual Therapy: STM cervical and thoracic spine patient supine  Release work through the clavicle and 1st rib  Manual cervical traction 10-15 sec hold several reps Myofacial release pecs/anterior chest; L lateral cervical area  Cervical stretch into flexion and lateral flexion to tolerance  Modaities:   Moist heat cervical spine x 10 min    [facility] Adult PT Treatment:                                                DATE: 02/20/24 Therapeutic Exercise: Doorway stretch 3 positions 30 sec x 2 slight turn to R to increase stretch to L  Lateral cervical flexion 3 sec x 3 R/L  Manual Therapy: STM cervical and thoracic spine patient supine  Release work through the clavicle and 1st rib  Manual cervical traction 10-15 sec hold several reps Traction through the L/R UE in neutral  Myofacial release pecs/anterior chest; L lateral cervical area  Gentle cervical stretch laterally to tolerance  Therapeutic activity: Open book sidelying R/L 5 sec x 10  Row green TB 3 sec x 10  Neuromuscular re-ed: Chin tuck with noodle 10 sec x 10 Thoracic rotation in sidelying  Chin tuck with coregeous ball   Self Care:  Office ergonomics   Sleeping positions and use of hand towel to support  c-spine                                                                                                                          Modaities:   Moist heat cervical spine x 10 min    [facility] Adult PT Treatment:                                                DATE: 02/08/24 Therapeutic Exercise: Doorway stretch 3 positions 30 sec x 2 slight turn to R to increase stretch to L  Lateral cervical flexion 3 sec x 3 R/L  Manual Therapy: STM cervical and thoracic spine patient supine  Release work through the clavicle and 1st rib  Manual cervical traction 10-15 sec hold several reps Traction through the L/R UE in neutral  Myofacial release pecs/anterior chest; L lateral cervical area  Gentle cervical stretch laterally to tolerance  Therapeutic activity: Open book sidelying R/L 5 sec x 10  Neuromuscular re-ed: Chin tuck with noodle 10 sec x 10 Scap squeeze with ER red TB x 20 W with noodle red TB x 20  Self Care:  Office ergonomics   Sleeping positions and use of hand towel to support  c-spine  Modaities:   Moist heat cervical spine x 10 min      PATIENT EDUCATION:  Education details: POC; HEP  Person educated: Patient Education method: Explanation, Demonstration, Tactile cues, Verbal cues, and Handouts Education comprehension: verbalized understanding, returned demonstration, verbal cues required, tactile cues required, and needs further education  HOME EXERCISE PROGRAM: Access Code: [email or web address] URL: [email or web address] Date: 02/01/2024 Prepared by: Maggie Senseney  Exercises - Seated Cervical Retraction  - 2 x daily - 7 x weekly - 1-2 sets - 5-10 reps - 10 sec  hold - Supine Cervical Retraction with Towel  - 2 x daily - 7 x weekly - 1 sets - 5-10 reps - 10 sec  hold - Seated Scapular Retraction  - 2 x daily - 7 x weekly - 1-2 sets - 10 reps - 10 sec  hold - Shoulder External Rotation and Scapular Retraction  - 1-2 x daily - 7 x weekly - 1 sets - 10 reps - 3-5 sec    hold - Standing Shoulder W at Wall  - 1-2 x daily - 7 x weekly - 1 sets - 10 reps - 3 sec  hold - Doorway Pec Stretch at 60 Degrees Abduction  - 3 x daily - 7 x weekly - 1 sets - 3 reps - Doorway Pec Stretch at 90 Degrees Abduction  - 3 x daily - 7 x weekly - 1 sets - 3 reps - 30 seconds  hold - Doorway Pec Stretch at 120 Degrees Abduction  - 3 x daily - 7 x weekly - 1 sets - 3 reps - 30 second hold  hold - Standing Anatomical Position with Scapular Retraction and Depression at Wall  - 2 x daily - 7 x weekly - 1 sets - 5-10 reps - 5-10 sec  hold - Supine Diaphragmatic Breathing  - 2 x daily - 7 x weekly - 1 sets - 10 reps - 4-6 sec  hold - Shoulder External Rotation and Scapular Retraction with Resistance  - 2 x daily - 7 x weekly - 1 sets - 10 reps - 3-5 sec  hold - Standing Bilateral Low Shoulder Row with Anchored Resistance  - 2 x daily - 7 x weekly - 1-3 sets - 10 reps - 2-3 sec  hold - Shoulder W - External Rotation with Resistance  - 2 x daily - 7 x weekly - 1-2 sets - 10 reps - 3 sec  hold - Drawing Bow  - 1 x daily - 7 x weekly - 1 sets - 10 reps - 3 sec  hold - Seated Cervical Sidebending AROM  - 2 x daily - 7 x weekly - 1 sets - 3-5 reps - 5-10 sec  hold - Shoulder extension with resistance - Neutral  - 2 x daily - 7 x weekly - 1 sets - 8 reps - Wall Angels  - 2 x daily - 7 x weekly - 1 sets - 8 reps - Sidelying Open Book Thoracic Lumbar Rotation and Extension  - 2 x daily - 7 x weekly - 1 sets - 10 reps - 3-5 sec  hold  Patient Education - Office Posture  ASSESSMENT:  CLINICAL IMPRESSION: 03/05/2024: Karna reports falling down her steps at home last week. She has a large bruise in L trunk area which is subsiding. She has had increased tightness in the neck following fall and notes more popping in the neck with every movement. Focus of today's treatment was on manual work specifically stretching  for cervical spine. Patient will continue with assisted lateral cervical flexion. She has  persistent tightness in the L > R cervical and shoulder girdle musculature including musculature in posterior, lateral and anterior cervical areas as well as pecs/upper traps/leveator. Continued treatment including neuromuscular reeducation; stretching and manual work (TPR; myofacial release and stretching). Trial of increased stretch with passive stretch by PT. Good response to treatment.    EVAL: Patient is a 59 y.o. female who was seen today for physical therapy evaluation and treatment for chronic L sided neck pain with cervical DDD. She reports symptoms present since mid summer with no known injury. She has done well following cervical fusion 30+ years ago and does not recall any neck pain or problems. 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 Patient presents to clinic with forward posture and alignment; limited and painful cervical mobility/ROM; tightness through the thoracic spine; limited end range shoulder ROM; difficulty with functional activities; pain on a daily basis; intermittent headaches. Patient will benefit form PT to address problems identified.   OBJECTIVE IMPAIRMENTS: decreased activity tolerance, decreased mobility, decreased ROM, decreased strength, increased fascial restrictions, increased muscle spasms, improper body mechanics, postural dysfunction, and pain.    GOALS: Goals reviewed with patient? Yes  SHORT TERM GOALS: Target date: 02/06/2024   Independent in initial HEP  Baseline:  Goal status: met  2.  Improve cervical ROM by 5-7 degrees in flexion and lateral flexion  Baseline:  Goal status: partially met   3.  Patient reports ability to sleep with a pillow  Baseline: thin pillow  Goal status: met  LONG TERM GOALS: Target date: 03/28/2024   Decrease pain in L cervical spine and shoulder by 75-90% Baseline:  Goal status: on going   2.  Pain free cervical ROM WFL's  Baseline:  Goal status: on going  3.  Patient reports  and demonstrates modifications in work and home ergonomics to avoid continued irritation of musculoskeletal symptoms  Baseline:  Goal status: on going  4.  Improve posture and alignment with patient to demonstrate more upright posture with activation of posterior shoulder girdle musculature  Baseline:  Goal status: on going   5.  Independent in advanced HEP  Baseline:  Goal status: on going   6.  Improve NDI by 5 points  Baseline: 17/50 01/09/24: 20/50; 40%  Goal status: on going    PLAN:  PT FREQUENCY: 2x/week  PT DURATION: 8 weeks  PLANNED INTERVENTIONS: 97164- PT Re-evaluation, 97110-Therapeutic exercises, 97530- Therapeutic activity, 97112- Neuromuscular re-education, 97535- Self Care, [id number]- Manual therapy, Patient/Family education, Taping, and Joint mobilization  PLAN FOR NEXT SESSION: review and progress exercises; continue with education re ergonomics and body mechanics; manual work and modalities as indicated  - progress with posterior shoulder girdle strengthening as tolerated    Rollie Hynek P. Ina PT, MPH 03/05/24 11:03 AM  "

## 2024-03-12 ENCOUNTER — Ambulatory Visit: Admitting: Rehabilitative and Restorative Service Providers"

## 2024-03-19 ENCOUNTER — Ambulatory Visit: Admitting: Rehabilitative and Restorative Service Providers"

## 2024-04-02 ENCOUNTER — Ambulatory Visit: Attending: Family Medicine | Admitting: Rehabilitative and Restorative Service Providers"

## 2024-04-17 ENCOUNTER — Ambulatory Visit: Admitting: Family Medicine
